# Patient Record
Sex: Female | Born: 1955 | Race: Black or African American | Hispanic: No | Marital: Married | Smoking: Never smoker
Health system: Southern US, Community
[De-identification: ages and names within clinical notes are randomized; demographics above are authoritative.]

## PROBLEM LIST (undated history)

## (undated) DIAGNOSIS — I10 Essential (primary) hypertension: Secondary | ICD-10-CM

## (undated) DIAGNOSIS — E119 Type 2 diabetes mellitus without complications: Secondary | ICD-10-CM

## (undated) DIAGNOSIS — I639 Cerebral infarction, unspecified: Secondary | ICD-10-CM

## (undated) HISTORY — DX: Cerebral infarction, unspecified: I63.9

## (undated) NOTE — *Deleted (*Deleted)
History and Physical    Sonya George WUJ:[id number] DOB: [birth year] DOA: 06/07/2020  PCP: Janeece Agee, NP   Patient coming from: Home.   I have personally briefly reviewed patient's old medical records in [facility]  Chief Complaint: ***  HPI: Sonya George is a 22 y.o. female with medical history significant of   ED Course: ***  Review of Systems: As per HPI otherwise all other systems reviewed and are negative.  Past Medical History:  Diagnosis Date  . Diabetes mellitus without complication (HCC)   . Hypertension   . Stroke [facility])     Past Surgical History:  Procedure Laterality Date  . BUBBLE STUDY  04/01/2020   Procedure: BUBBLE STUDY;  Surgeon: Wendall Stade, MD;  Location: [facility] ENDOSCOPY;  Service: Cardiovascular;;  . TEE WITHOUT CARDIOVERSION N/A 04/01/2020   Procedure: TRANSESOPHAGEAL ECHOCARDIOGRAM (TEE);  Surgeon: Wendall Stade, MD;  Location: [facility] ENDOSCOPY;  Service: Cardiovascular;  Laterality: N/A;   Social History  reports that she has never smoked. She has never used smokeless tobacco. She reports that she does not drink alcohol and does not use drugs.  No Known Allergies  Family History  Problem Relation Age of Onset  . Stroke Sister        Actively dying of CVA  . Stroke Brother        Died 2 weeks ago of CVA   Prior to Admission medications   Medication Sig Start Date End Date Taking? Authorizing Provider  acetaminophen (TYLENOL) 500 MG tablet Take 1,000 mg by mouth every 6 (six) hours as needed for headache (pain).   Yes [provider]  amLODipine (NORVASC) 10 MG tablet Take 1 tablet (10 mg total) by mouth daily. 12/14/19  Yes Janeece Agee, NP  aspirin EC 81 MG EC tablet Take 1 tablet (81 mg total) by mouth daily. Swallow whole. 04/02/20  Yes Vann, Jessica U, DO  atorvastatin (LIPITOR) 40 MG tablet Take 1 tablet (40 mg total) by mouth daily. 04/02/20  Yes Joseph Art, DO  clopidogrel (PLAVIX) 75 MG tablet Take 1 tablet  (75 mg total) by mouth daily. 04/02/20  Yes Vann, Jessica U, DO  lisinopril (ZESTRIL) 10 MG tablet Take 1 tablet (10 mg total) by mouth daily. 12/14/19  Yes Janeece Agee, NP  Menthol, Topical Analgesic, (BIOFREEZE EX) Apply 1 application topically daily as needed (leg pain).   Yes [provider]  metFORMIN (GLUCOPHAGE) 500 MG tablet Take 2 tablets (1,000 mg total) by mouth 2 (two) times daily with a meal. 12/14/19 06/11/20 Yes Janeece Agee, NP  Multiple Vitamins-Minerals (MULTIVITAMIN WITH MINERALS) tablet Take 1 tablet by mouth daily.   Yes [provider]   Physical Exam: Vitals:   06/07/20 1100 06/07/20 1115 06/07/20 1503 06/07/20 2052  BP: 120/68 136/67 (!) 141/104 111/62  Pulse: 79 84 91 79  Resp: 19 19 18 14   Temp:      TempSrc:      SpO2: 100% 98% 99% 100%  Weight:      Height:       Constitutional: NAD, calm, comfortable Eyes: PERRL, lids and conjunctivae normal ENMT: Mucous membranes are moist. Posterior pharynx clear of any exudate or lesions. Neck: normal, supple, no masses, no thyromegaly Respiratory: clear to auscultation bilaterally, no wheezing, no crackles. Normal respiratory effort. No accessory muscle use.  Cardiovascular: Regular rate and rhythm, no murmurs / rubs / gallops. No extremity edema. 2+ pedal pulses. No carotid bruits.  Abdomen: no tenderness, no masses  palpated. No hepatosplenomegaly. Bowel sounds positive.  Musculoskeletal: no clubbing / cyanosis. No joint deformity upper and lower extremities. Good ROM, no contractures. Normal muscle tone.  Skin: no rashes, lesions, ulcers. No induration Neurologic: CN 2-12 grossly intact. Sensation intact, DTR normal. Strength 5/5 in all 4.  Psychiatric: Normal judgment and insight. Alert and oriented x 3. Normal mood.   Labs on Admission: I have personally reviewed following labs and imaging studies  CBC: Recent Labs  Lab 06/07/20 0901  WBC 7.7  NEUTROABS 4.9  HGB 12.6  HCT 38.2  MCV 91.0   PLT 250    Basic Metabolic Panel: Recent Labs  Lab 06/07/20 0901  NA 139  K 3.8  CL 102  CO2 27  GLUCOSE 166*  BUN 9  CREATININE 0.71  CALCIUM 9.9   GFR: Estimated Creatinine Clearance: 65.6 mL/min (by C-G formula based on SCr of 0.71 mg/dL).  Liver Function Tests: Recent Labs  Lab 06/07/20 0901  AST 40  ALT 20  ALKPHOS 73  BILITOT 0.9  PROT 7.2  ALBUMIN 3.7   Urine analysis:    Component Value Date/Time   COLORURINE STRAW (A) 03/30/2020 1533   APPEARANCEUR CLEAR 03/30/2020 1533   LABSPEC 1.005 03/30/2020 1533   PHURINE 7.0 03/30/2020 1533   GLUCOSEU 50 (A) 03/30/2020 1533   HGBUR NEGATIVE 03/30/2020 1533   BILIRUBINUR NEGATIVE 03/30/2020 1533   KETONESUR NEGATIVE 03/30/2020 1533   PROTEINUR NEGATIVE 03/30/2020 1533   NITRITE NEGATIVE 03/30/2020 1533   LEUKOCYTESUR NEGATIVE 03/30/2020 1533   Radiological Exams on Admission: CT HEAD WO CONTRAST  Result Date: 06/07/2020 CLINICAL DATA:  67 year old female with history of mental status change. EXAM: CT HEAD WITHOUT CONTRAST TECHNIQUE: Contiguous axial images were obtained from the base of the skull through the vertex without intravenous contrast. COMPARISON:  Head CT 03/29/2020. FINDINGS: Brain: Mild cerebral atrophy. Patchy and confluent areas of decreased attenuation are noted throughout the deep and periventricular white matter of the cerebral hemispheres bilaterally, compatible with chronic microvascular ischemic disease. In addition, there are more well-defined areas of low attenuation scattered in a patchy distribution throughout the left cerebral hemisphere, right occipital region, and left cerebellar hemisphere compatible with areas of encephalomalacia/gliosis from prior infarctions. No definitive evidence of new acute or subacute ischemia. No definite mass or mass effect. No hydrocephalus. Vascular: No hyperdense vessel or unexpected calcification. Skull: Normal. Negative for fracture or focal lesion.  Sinuses/Orbits: No acute finding. Other: None. IMPRESSION: 1. No acute intracranial abnormalities. 2. Mild cerebral atrophy with chronic microvascular ischemic changes and patchy areas of encephalomalacia/gliosis in the cerebrum and cerebellum, similar to prior examinations, as above. Electronically Signed   By: Trudie Reed M.D.   On: 06/07/2020 10:03   DG Chest Port 1 View  Result Date: 06/07/2020 CLINICAL DATA:  Altered mental status, chest pain EXAM: PORTABLE CHEST 1 VIEW COMPARISON:  03/29/2020 chest radiograph. FINDINGS: Stable cardiomediastinal silhouette with mild cardiomegaly. No pneumothorax. No pleural effusion. Lungs appear clear, with no acute consolidative airspace disease and no pulmonary edema. IMPRESSION: Stable mild cardiomegaly without pulmonary edema. No active pulmonary disease. Electronically Signed   By: Delbert Phenix M.D.   On: 06/07/2020 09:49   04/01/2020 Echo TEE IMPRESSIONS   1. Left ventricular ejection fraction, by estimation, is 60 to 65%. The  left ventricle has normal function. The left ventricle has no regional  wall motion abnormalities.  2. Right ventricular systolic function is normal. The right ventricular  size is normal.  3. Left atrial size  was moderately dilated. No left atrial/left atrial  appendage thrombus was detected.  4. The mitral valve is normal in structure. Mild mitral valve  regurgitation. No evidence of mitral stenosis.  5. Tricuspid valve regurgitation is mild to moderate.  6. The aortic valve is tricuspid. Aortic valve regurgitation is moderate.  Mild to moderate aortic valve sclerosis/calcification is present, without  any evidence of aortic stenosis.  7. The inferior vena cava is normal in size with greater than 50%  respiratory variability, suggesting right atrial pressure of 3 mmHg.  8. Agitated saline contrast bubble study was negative, with no evidence  of any interatrial shunt.   Echo 03/30/2020   IMPRESSIONS  1.  Left ventricular ejection fraction, by estimation, is 60 to 65%. The  left ventricle has normal function. The left ventricle has no regional  wall motion abnormalities. Left ventricular diastolic parameters are  consistent with Grade I diastolic  dysfunction (impaired relaxation).  2. Right ventricular systolic function is normal. The right ventricular  size is normal. There is normal pulmonary artery systolic pressure.  3. Left atrial size was moderately dilated.  4. The mitral valve is normal in structure. No evidence of mitral valve  regurgitation. No evidence of mitral stenosis.  5. The aortic valve is normal in structure. Aortic valve regurgitation is  mild to moderate. No aortic stenosis is present.  6. The inferior vena cava is normal in size with greater than 50%  respiratory variability, suggesting right atrial pressure of 3 mmHg.   03/30/2020 carotid doppler  Summary:  Right Carotid: Velocities in the right ICA are consistent with a 1-39%  stenosis.   Left Carotid: Velocities in the left ICA are consistent with a 1-39%  stenosis.   Vertebrals: Bilateral vertebral arteries demonstrate antegrade flow.  Subclavians: Normal flow hemodynamics were seen in bilateral subclavian arteries.  EKG: Independently reviewed.  Vent. rate 84 BPM PR interval * ms QRS duration 94 ms QT/QTc 362/428 ms P-R-T axes 62 -1 30 Sinus rhythm Consider left ventricular hypertrophy  Assessment/Plan Principal Problem:   Acute encephalopathy Active Problems:   Type 2 diabetes mellitus (HCC)   Essential hypertension    DVT prophylaxis: *** (Lovenox/Heparin/SCD's/anticoagulated/None (if comfort care) Code Status:   *** (Full/Partial (specify details) Family Communication:  *** (Specify name, relationship. Do not write "discussed with patient". Specify tel # if discussed over the phone) Disposition Plan:   Patient is from:  ***  Anticipated DC to:  ***  Anticipated DC date:  ***   Anticipated DC barriers: ***  Consults called:  *** (with names) Admission status:  *** (inpatient / obs / tele / medical floor / SDU)  Severity of Illness:  Bobette Mo MD [facility]  How to contact the [facility] Attending or Consulting provider 7A - 7P or covering provider during after hours 7P -7A, for this patient?   1. Check the care team in [facility] and look for a) attending/consulting [facility] provider listed and b) the [facility] team listed 2. Log into [email or web address] and use [place]'s universal password to access. If you do not have the password, please contact the hospital operator. 3. Locate the [facility] provider you are looking for under [facility] and page to a number that you can be directly reached. 4. If you still have difficulty reaching the provider, please page the [facility] (Director on Call) for the Hospitalists listed on amion for assistance.  06/07/2020, 9:38 PM

---

## 2019-01-02 ENCOUNTER — Ambulatory Visit (HOSPITAL_COMMUNITY)
Admission: EM | Admit: 2019-01-02 | Discharge: 2019-01-02 | Disposition: A | Discharge status: Home / Self Care | Payer: Self-pay | Attending: Family Medicine | Admitting: Family Medicine

## 2019-01-02 ENCOUNTER — Encounter (HOSPITAL_COMMUNITY): Payer: Self-pay

## 2019-01-02 ENCOUNTER — Other Ambulatory Visit: Payer: Self-pay

## 2019-01-02 DIAGNOSIS — I10 Essential (primary) hypertension: Secondary | ICD-10-CM | POA: Insufficient documentation

## 2019-01-02 DIAGNOSIS — E1110 Type 2 diabetes mellitus with ketoacidosis without coma: Secondary | ICD-10-CM | POA: Insufficient documentation

## 2019-01-02 DIAGNOSIS — Z760 Encounter for issue of repeat prescription: Secondary | ICD-10-CM | POA: Insufficient documentation

## 2019-01-02 HISTORY — DX: Type 2 diabetes mellitus without complications: E11.9

## 2019-01-02 HISTORY — DX: Essential (primary) hypertension: I10

## 2019-01-02 LAB — COMPREHENSIVE METABOLIC PANEL
ALT: 14 U/L (ref 0–44)
AST: 21 U/L (ref 15–41)
Albumin: 3.9 g/dL (ref 3.5–5.0)
Alkaline Phosphatase: 72 U/L (ref 38–126)
Anion gap: 8 (ref 5–15)
BUN: 12 mg/dL (ref 8–23)
CO2: 27 mmol/L (ref 22–32)
Calcium: 9.7 mg/dL (ref 8.9–10.3)
Chloride: 104 mmol/L (ref 98–111)
Creatinine, Ser: 0.6 mg/dL (ref 0.44–1.00)
GFR calc Af Amer: >60 mL/min (ref >60)
GFR calc non Af Amer: >60 mL/min (ref >60)
Glucose, Bld: 107 mg/dL — ABNORMAL HIGH (ref 70–99)
Potassium: 4.1 mmol/L (ref 3.5–5.1)
Sodium: 139 mmol/L (ref 135–145)
Total Bilirubin: 0.6 mg/dL (ref 0.3–1.2)
Total Protein: 7.9 g/dL (ref 6.5–8.1)

## 2019-01-02 LAB — LIPID PANEL
Cholesterol: 154 mg/dL (ref 0–200)
HDL: 44 mg/dL (ref >40)
LDL Cholesterol: 94 mg/dL (ref 0–99)
Total CHOL/HDL Ratio: 3.5 ratio
Triglycerides: 81 mg/dL (ref <150)
VLDL: 16 mg/dL (ref 0–40)

## 2019-01-02 LAB — HEMOGLOBIN A1C
Hgb A1c MFr Bld: 5.6 % (ref 4.8–5.6)
Mean Plasma Glucose: 114.02 mg/dL

## 2019-01-02 MED ORDER — AMLODIPINE BESYLATE 10 MG PO TABS: 10.0000 mg | ORAL_TABLET | Freq: Every day | ORAL | 0 refills | Status: DC

## 2019-01-02 MED ORDER — METFORMIN HCL 500 MG PO TABS: 1000.0000 mg | ORAL_TABLET | Freq: Two times a day (BID) | ORAL | 0 refills | Status: DC

## 2019-01-02 MED ORDER — LISINOPRIL 10 MG PO TABS: 10.0000 mg | ORAL_TABLET | Freq: Every day | ORAL | 0 refills | Status: DC

## 2019-01-02 MED ORDER — ASPIRIN EC 81 MG PO TBEC: 81.0000 mg | DELAYED_RELEASE_TABLET | Freq: Every day | ORAL | 0 refills | Status: AC

## 2019-01-02 NOTE — ED Notes (Signed)
Patient verbalizes understanding of discharge instructions. Opportunity for questioning and answers were provided. Patient discharged from UCC by provider.  

## 2019-01-02 NOTE — ED Triage Notes (Signed)
Patient presents to Urgent Care with complaints of needing medication refill since becoming low on some of her daily meds, including metformin, lisinopril, amlodipine, and aspirin. Patient reports she is here from [place], is not able to return home due to the pandemic at this time. Pt is not OUT of any medications, is just worried of becoming low. Pt brought medications with her.

## 2019-01-02 NOTE — Discharge Instructions (Addendum)
Your routine diabetic labs were done today. This is so that your new PCP can review them and help you make any medication adjustments you may need. It is important to keep track of your sugars if you are able to.  Please write down your sugars when you check and bring them to your PCP

## 2019-01-02 NOTE — ED Provider Notes (Addendum)
[place]    CSN: [id number] Arrival date & time: 01/02/19  1115     History   Chief Complaint Chief Complaint  Patient presents with  . Medication Refill    HPI Sonya George is a 63 y.o. female presenting for medication refill with her son-in-law.  Patient lives in [place], was visiting family though has had unexpected prolonged stay due to coronavirus.  Patient reports history of diabetes and hypertension, brought medications with her today's appointment.  Patient's son-in-law states that they are also interested in establishing care in the area as on his borders recently closed and they are unsure when she will be able to go back.  Patient does not routinely check her sugars: Denies hypoglycemic events, polyuria, polydipsia, polyphagia.  Patient states she is compliant with her medications and rarely misses doses.   Past Medical History:  Diagnosis Date  . Diabetes mellitus without complication ([place])   . Hypertension     There are no active problems to display for this patient.   History reviewed. No pertinent surgical history.  OB History   No obstetric history on file.      Home Medications    Prior to Admission medications   Medication Sig Start Date End Date Taking? Authorizing Provider  amLODipine (NORVASC) 10 MG tablet Take 1 tablet (10 mg total) by mouth daily for 30 days. 01/02/19 02/01/19  Hall-Potvin, [place], PA-C  aspirin EC 81 MG tablet Take 1 tablet (81 mg total) by mouth daily for 30 days. 01/02/19 02/01/19  Hall-Potvin, [place], PA-C  lisinopril (ZESTRIL) 10 MG tablet Take 1 tablet (10 mg total) by mouth daily for 30 days. 01/02/19 02/01/19  Hall-Potvin, [place], PA-C  metFORMIN (GLUCOPHAGE) 500 MG tablet Take 2 tablets (1,000 mg total) by mouth 2 (two) times daily with a meal for 30 days. 01/02/19 02/01/19  Hall-Potvin, [place], PA-C    Family History Family History  Problem Relation Age of Onset  . Healthy Mother   . Healthy Father      Social History Social History   Tobacco Use  . Smoking status: Never Smoker  . Smokeless tobacco: Never Used  Substance Use Topics  . Alcohol use: Not Currently  . Drug use: Never     Allergies   Patient has no known allergies.   Review of Systems As per HPI   Physical Exam Triage Vital Signs ED Triage Vitals  Enc Vitals Group     BP 01/02/19 1140 132/86     Pulse Rate 01/02/19 1140 84     Resp 01/02/19 1140 18     Temp 01/02/19 1140 98.4 F (36.9 C)     Temp Source 01/02/19 1140 Oral     SpO2 01/02/19 1140 100 %     Weight --      Height --      Head Circumference --      Peak Flow --      Pain Score 01/02/19 1137 0     Pain Loc --      Pain Edu? --      Excl. in [place]? --    No data found.  Updated Vital Signs BP 132/86 (BP Location: Left Arm)   Pulse 84   Temp 98.4 F (36.9 C) (Oral)   Resp 18   SpO2 100%   Visual Acuity Right Eye Distance:   Left Eye Distance:   Bilateral Distance:    Right Eye Near:   Left Eye Near:  Bilateral Near:     Physical Exam Vitals signs reviewed.  Constitutional:      General: She is not in acute distress.    Appearance: She is obese.  HENT:     Head: Normocephalic and atraumatic.  Eyes:     General: No scleral icterus.    Pupils: Pupils are equal, round, and reactive to light.  Cardiovascular:     Rate and Rhythm: Normal rate.  Pulmonary:     Effort: Pulmonary effort is normal.  Skin:    General: Skin is warm.     Coloration: Skin is not jaundiced or pale.  Neurological:     Mental Status: She is alert and oriented to person, place, and time.      UC Treatments / Results  Labs (all labs ordered are listed, but only abnormal results are displayed) Labs Reviewed  COMPREHENSIVE METABOLIC PANEL - Abnormal; Notable for the following components:      Result Value   Glucose, Bld 107 (*)    All other components within normal limits  HEMOGLOBIN A1C  LIPID PANEL    EKG None  Radiology No results  found.  Procedures Procedures (including critical care time)  Medications Ordered in UC Medications - No data to display  Initial Impression / Assessment and Plan / UC Course  I have reviewed the triage vital signs and the nursing notes.  Pertinent labs & imaging results that were available during my care of the patient were reviewed by me and considered in my medical decision making (see chart for details).     Pleasant 63 year old female from [place] with history of diabetes and hypertension reportedly well controlled presenting for medication refill.  Medication sent, routine lab work done (reviewed after patient discharge: A1c 5.6, lipid and comp within normal ranges) for primary care to review intends on establishing care this week.   Final Clinical Impressions(s) / UC Diagnoses   Final diagnoses:  Encounter for medication refill  DM (diabetes mellitus) type 2, uncontrolled, with ketoacidosis ([place])  Essential hypertension     Discharge Instructions     Your routine diabetic labs were done today. This is so that your new PCP can review them and help you make any medication adjustments you may need. It is important to keep track of your sugars if you are able to.  Please write down your sugars when you check and bring them to your PCP    ED Prescriptions    Medication Sig Dispense Auth. Provider   amLODipine (NORVASC) 10 MG tablet Take 1 tablet (10 mg total) by mouth daily for 30 days. 30 tablet Hall-Potvin, [place], PA-C   aspirin EC 81 MG tablet Take 1 tablet (81 mg total) by mouth daily for 30 days. 30 tablet Hall-Potvin, [place], PA-C   lisinopril (ZESTRIL) 10 MG tablet Take 1 tablet (10 mg total) by mouth daily for 30 days. 30 tablet Hall-Potvin, [place], PA-C   metFORMIN (GLUCOPHAGE) 500 MG tablet Take 2 tablets (1,000 mg total) by mouth 2 (two) times daily with a meal for 30 days. 120 tablet Hall-Potvin, [place], PA-C     Controlled Substance Prescriptions [place]  Controlled Substance Registry consulted? Not Applicable   Quincy Sheehan, PA-C 01/02/19 1810    Hall-Potvin, [place], [place] 01/02/19 1815

## 2019-01-19 ENCOUNTER — Ambulatory Visit: Payer: Self-pay | Admitting: Registered Nurse

## 2019-01-29 ENCOUNTER — Encounter: Payer: Self-pay | Admitting: Registered Nurse

## 2019-01-29 ENCOUNTER — Other Ambulatory Visit: Payer: Self-pay

## 2019-01-29 ENCOUNTER — Ambulatory Visit: Payer: Self-pay | Admitting: Registered Nurse

## 2019-01-29 VITALS — BP 150/74 | HR 91 | Temp 98.4°F | Resp 16 | Wt 159.0 lb

## 2019-01-29 DIAGNOSIS — E119 Type 2 diabetes mellitus without complications: Secondary | ICD-10-CM | POA: Insufficient documentation

## 2019-01-29 DIAGNOSIS — Z7689 Persons encountering health services in other specified circumstances: Secondary | ICD-10-CM

## 2019-01-29 DIAGNOSIS — I10 Essential (primary) hypertension: Secondary | ICD-10-CM | POA: Insufficient documentation

## 2019-01-29 MED ORDER — LISINOPRIL 10 MG PO TABS: 10.0000 mg | ORAL_TABLET | Freq: Every day | ORAL | 0 refills | Status: DC

## 2019-01-29 MED ORDER — METFORMIN HCL 500 MG PO TABS: 1000.0000 mg | ORAL_TABLET | Freq: Two times a day (BID) | ORAL | 2 refills | Status: DC

## 2019-01-29 MED ORDER — AMLODIPINE BESYLATE 10 MG PO TABS: 10.0000 mg | ORAL_TABLET | Freq: Every day | ORAL | 0 refills | Status: DC

## 2019-01-29 NOTE — Patient Instructions (Signed)
° ° ° °  If you have lab work done today you will be contacted with your lab results within the next 2 weeks.  If you have not heard from us then please contact us. The fastest way to get your results is to register for My Chart. ° ° °IF you received an x-ray today, you will receive an invoice from [facility]. Please contact [facility] at [phone] with questions or concerns regarding your invoice.  ° °IF you received labwork today, you will receive an invoice from [facility]. Please contact [facility] at [phone] with questions or concerns regarding your invoice.  ° °Our billing staff will not be able to assist you with questions regarding bills from these companies. ° °You will be contacted with the lab results as soon as they are available. The fastest way to get your results is to activate your My Chart account. Instructions are located on the last page of this paperwork. If you have not heard from us regarding the results in 2 weeks, please contact this office. °  ° ° ° °

## 2019-01-29 NOTE — Progress Notes (Signed)
Established Patient Office Visit  Subjective:  Patient ID: Sonya George, female    DOB: 1956/01/17  Age: 63 y.o. MRN: [id number]  CC:  Chief Complaint  Patient presents with  . Establish Care    HPI Sonya George presents for visit to establish care.  She is from [place] - she had been traveling to visit her children in [place] when COVID-19 caused the imposition of travel restrictions. As such, she has been here substantially longer than she originally intended.  She has a medical history significant for T2DM, CVA, and HTN. These are managed with medications:  T2DM managed with metformin 2000mg  PO qd. Her most recent A1c is 5.6% - this is very well within control. She may continue this dose, we will recheck A1c in 3 mos and consider lowering her dose. She does not check sugars at home.   HTN: Lisinopril 10mg  PO qd and amlodipine 10mg  PO qd. Her BP is mildly elevated to 150/74 today. This warrants a recheck and may warrant further medication management. She states this is higher than her normal range.   CVA: Daily ASA  Pt reports it has been too long since she updated her glasses prescription and is concerned for her vision - we will refer to ophthalmology for diabetic eye exam, as she has had none on file with [place].  She reports that she feels like she "can't walk". Denies pain in chest and lower extremities, denies dizziness, denies claudication, denies swelling of extremities, denies headache, denies GI upset, denies unilateral weakness. Endorses fatigue. States her activity level has decreased since she came to visit her children - she is trying to stay socially isolated as to avoid COVID-19.  Past Medical History:  Diagnosis Date  . Diabetes mellitus without complication ([place])   . Hypertension   . Stroke [facility])     History reviewed. No pertinent surgical history.  Family History  Problem Relation Age of Onset  . Healthy Mother   . Healthy Father     Social History    Socioeconomic History  . Marital status: Married    Spouse name: Not on file  . Number of children: Not on file  . Years of education: Not on file  . Highest education level: Not on file  Occupational History  . Not on file  Social Needs  . Financial resource strain: Not hard at all  . Food insecurity    Worry: Never true    Inability: Never true  . Transportation needs    Medical: No    Non-medical: No  Tobacco Use  . Smoking status: Never Smoker  . Smokeless tobacco: Never Used  Substance and Sexual Activity  . Alcohol use: Not Currently  . Drug use: Never  . Sexual activity: Not on file  Lifestyle  . Physical activity    Days per week: 3 days    Minutes per session: 30 min  . Stress: Only a little  Relationships  . Social Herbalist on phone: Three times a week    Gets together: Twice a week    Attends religious service: Patient refused    Active member of club or organization: Patient refused    Attends meetings of clubs or organizations: Patient refused    Relationship status: Married  . Intimate partner violence    Fear of current or ex partner: No    Emotionally abused: No    Physically abused: No    Forced sexual activity: No  Other Topics Concern  . Not on file  Social History Narrative  . Not on file    Outpatient Medications Prior to Visit  Medication Sig Dispense Refill  . amLODipine (NORVASC) 10 MG tablet Take 1 tablet (10 mg total) by mouth daily for 30 days. 30 tablet 0  . aspirin EC 81 MG tablet Take 1 tablet (81 mg total) by mouth daily for 30 days. 30 tablet 0  . lisinopril (ZESTRIL) 10 MG tablet Take 1 tablet (10 mg total) by mouth daily for 30 days. 30 tablet 0  . metFORMIN (GLUCOPHAGE) 500 MG tablet Take 2 tablets (1,000 mg total) by mouth 2 (two) times daily with a meal for 30 days. 120 tablet 0   No facility-administered medications prior to visit.     No Known Allergies  ROS Review of Systems  Constitutional: Negative.    HENT: Negative.   Eyes: Negative.   Respiratory: Negative.   Cardiovascular: Negative.   Gastrointestinal: Negative.   Endocrine: Negative.   Genitourinary: Negative.   Musculoskeletal: Negative.   Skin: Negative.   Allergic/Immunologic: Negative.   Neurological: Negative.   Hematological: Negative.   Psychiatric/Behavioral: Negative.       Objective:    Physical Exam  Constitutional: She is oriented to person, place, and time. She appears well-developed and well-nourished.  HENT:  Head: Normocephalic and atraumatic.  Cardiovascular: Normal rate, regular rhythm and intact distal pulses.  Pulmonary/Chest: Effort normal. No respiratory distress.  Musculoskeletal: Normal range of motion.        General: No tenderness, deformity or edema.  Neurological: She is alert and oriented to person, place, and time.  Skin: Skin is warm and dry. No rash noted. No erythema. No pallor.  Psychiatric: She has a normal mood and affect. Her behavior is normal. Judgment and thought content normal.   Strength 5/5 and even bilaterally.  Rapid capillary refill in bilat lower extremities   BP (!) 150/74   Pulse 91   Temp 98.4 F (36.9 C) (Other (Comment))   Resp 16   Wt 159 lb (72.1 kg)   SpO2 97%  Wt Readings from Last 3 Encounters:  01/29/19 159 lb (72.1 kg)     Health Maintenance Due  Topic Date Due  . Hepatitis C Screening  March 17, 1956  . HIV Screening  05/11/1970  . TETANUS/TDAP  05/11/1974  . PAP SMEAR-Modifier  05/11/1976  . MAMMOGRAM  05/11/2005  . COLONOSCOPY  05/11/2005    There are no preventive care reminders to display for this patient.  No results found for: TSH No results found for: WBC, HGB, HCT, MCV, PLT Lab Results  Component Value Date   NA 139 01/02/2019   K 4.1 01/02/2019   CO2 27 01/02/2019   GLUCOSE 107 (H) 01/02/2019   BUN 12 01/02/2019   CREATININE 0.60 01/02/2019   BILITOT 0.6 01/02/2019   ALKPHOS 72 01/02/2019   AST 21 01/02/2019   ALT 14  01/02/2019   PROT 7.9 01/02/2019   ALBUMIN 3.9 01/02/2019   CALCIUM 9.7 01/02/2019   ANIONGAP 8 01/02/2019   Lab Results  Component Value Date   CHOL 154 01/02/2019   Lab Results  Component Value Date   HDL 44 01/02/2019   Lab Results  Component Value Date   LDLCALC 94 01/02/2019   Lab Results  Component Value Date   TRIG 81 01/02/2019   Lab Results  Component Value Date   CHOLHDL 3.5 01/02/2019   Lab Results  Component Value Date  HGBA1C 5.6 01/02/2019      Assessment & Plan:   Problem List Items Addressed This Visit    None    Visit Diagnoses    Encounter to establish care    -  Primary   Type 2 diabetes mellitus without complication, without long-term current use of insulin ([place])          No orders of the defined types were placed in this encounter.   Follow-up: No follow-ups on file.   PLAN:  Suspect weakness/fatigue is related to a combination of low blood sugar and deconditioning. This is a patient who was reportedly very active before these preceding months of inactivity d/t COVID-19. We discussed doing home exercises as well as monitoring her sugars at home. Given her a1c of 5.6%, we discussed that it is fine to go up a few points - I usually strive keep patients of her age and history in a range of 7-8%. Our main goal is to avoid falls.   Continue medications as prescribed. Will send in 3 mos of refills - we are hoping she is able to travel home in that time, otherwise, we will see her for a med check and labs.  Patient encouraged to call clinic with any questions, comments, or concerns.    Maximiano Coss, NP

## 2019-04-11 ENCOUNTER — Encounter: Payer: Self-pay | Admitting: Registered Nurse

## 2019-04-18 ENCOUNTER — Other Ambulatory Visit: Payer: Self-pay | Admitting: Registered Nurse

## 2019-04-18 DIAGNOSIS — E119 Type 2 diabetes mellitus without complications: Secondary | ICD-10-CM

## 2019-04-18 DIAGNOSIS — I10 Essential (primary) hypertension: Secondary | ICD-10-CM

## 2019-04-18 MED ORDER — AMLODIPINE BESYLATE 10 MG PO TABS: 10.0000 mg | ORAL_TABLET | Freq: Every day | ORAL | 0 refills | Status: DC

## 2019-04-18 MED ORDER — LISINOPRIL 10 MG PO TABS: 10.0000 mg | ORAL_TABLET | Freq: Every day | ORAL | 0 refills | Status: DC

## 2019-04-18 MED ORDER — METFORMIN HCL 500 MG PO TABS: 1000.0000 mg | ORAL_TABLET | Freq: Two times a day (BID) | ORAL | 0 refills | Status: DC

## 2019-04-18 NOTE — Telephone Encounter (Signed)
Medication Refill - Medication:      amLODipine (NORVASC) 10 MG tablet    lisinopril (ZESTRIL) 10 MG tablet    metFORMIN (GLUCOPHAGE) 500 MG tablet        Preferred Pharmacy (with phone number or street name):  [facility] [address], [place] [phone] (Phone) [phone] (Fax)

## 2019-04-30 ENCOUNTER — Ambulatory Visit: Payer: Self-pay | Admitting: Registered Nurse

## 2019-05-01 ENCOUNTER — Encounter: Payer: Self-pay | Admitting: Registered Nurse

## 2019-09-03 ENCOUNTER — Other Ambulatory Visit: Payer: Self-pay | Admitting: Registered Nurse

## 2019-09-03 DIAGNOSIS — I10 Essential (primary) hypertension: Secondary | ICD-10-CM

## 2019-09-03 NOTE — Telephone Encounter (Signed)
Need an appt for any further refills

## 2019-09-03 NOTE — Telephone Encounter (Signed)
Please schedule appt to be seen for any further refills

## 2019-09-04 ENCOUNTER — Telehealth: Payer: Self-pay | Admitting: Registered Nurse

## 2019-09-04 NOTE — Telephone Encounter (Signed)
Pt mb full, could not leave msg

## 2019-10-03 ENCOUNTER — Other Ambulatory Visit: Payer: Self-pay | Admitting: Registered Nurse

## 2019-10-03 DIAGNOSIS — I10 Essential (primary) hypertension: Secondary | ICD-10-CM

## 2019-12-04 ENCOUNTER — Other Ambulatory Visit: Payer: Self-pay | Admitting: Registered Nurse

## 2019-12-04 DIAGNOSIS — I10 Essential (primary) hypertension: Secondary | ICD-10-CM

## 2019-12-07 ENCOUNTER — Other Ambulatory Visit: Payer: Self-pay | Admitting: Registered Nurse

## 2019-12-07 DIAGNOSIS — I10 Essential (primary) hypertension: Secondary | ICD-10-CM

## 2019-12-07 NOTE — Telephone Encounter (Signed)
Pt is needing an appt for nx refills, last refill was courtesy

## 2019-12-11 NOTE — Telephone Encounter (Signed)
LVM for pt to call back to make appt for this med refill.

## 2019-12-14 ENCOUNTER — Encounter: Payer: Self-pay | Admitting: Registered Nurse

## 2019-12-14 ENCOUNTER — Other Ambulatory Visit: Payer: Self-pay

## 2019-12-14 ENCOUNTER — Ambulatory Visit (INDEPENDENT_AMBULATORY_CARE_PROVIDER_SITE_OTHER): Payer: Self-pay | Admitting: Registered Nurse

## 2019-12-14 DIAGNOSIS — I10 Essential (primary) hypertension: Secondary | ICD-10-CM

## 2019-12-14 DIAGNOSIS — E119 Type 2 diabetes mellitus without complications: Secondary | ICD-10-CM

## 2019-12-14 LAB — POCT GLYCOSYLATED HEMOGLOBIN (HGB A1C): Hemoglobin A1C: 6.8 % — AB (ref 4.0–5.6)

## 2019-12-14 MED ORDER — LISINOPRIL 10 MG PO TABS: 10.0000 mg | ORAL_TABLET | Freq: Every day | ORAL | 1 refills | Status: AC

## 2019-12-14 MED ORDER — AMLODIPINE BESYLATE 10 MG PO TABS: 10.0000 mg | ORAL_TABLET | Freq: Every day | ORAL | 1 refills | Status: AC

## 2019-12-14 MED ORDER — METFORMIN HCL 500 MG PO TABS: 1000.0000 mg | ORAL_TABLET | Freq: Two times a day (BID) | ORAL | 1 refills | Status: AC

## 2019-12-14 NOTE — Patient Instructions (Signed)
° ° ° °  If you have lab work done today you will be contacted with your lab results within the next 2 weeks.  If you have not heard from us then please contact us. The fastest way to get your results is to register for My Chart. ° ° °IF you received an x-ray today, you will receive an invoice from [facility]. Please contact [facility] at [phone] with questions or concerns regarding your invoice.  ° °IF you received labwork today, you will receive an invoice from [facility]. Please contact [facility] at [phone] with questions or concerns regarding your invoice.  ° °Our billing staff will not be able to assist you with questions regarding bills from these companies. ° °You will be contacted with the lab results as soon as they are available. The fastest way to get your results is to activate your My Chart account. Instructions are located on the last page of this paperwork. If you have not heard from us regarding the results in 2 weeks, please contact this office. °  ° ° ° °

## 2019-12-14 NOTE — Progress Notes (Signed)
Established Patient Office Visit  Subjective:  Patient ID: Sonya George, female    DOB: 19-May-1956  Age: 64 y.o. MRN: [id number]  CC:  Chief Complaint  Patient presents with  . Medication Refill    needs a refill on all medications and just a check up    HPI Ailin Yantis presents for HTN and T2DM med refills  Feeling well. No chest pain, shob, doe, or other concerns regarding bp Does not check home bp or sugars No changes to diet or activity. Occ some mild swelling of ankles but no claudication, no current swelling.  Declines further testing and preventative care as she does not have insurance - lives in [place] but is staying with her daughter through the pandemic.  Past Medical History:  Diagnosis Date  . Diabetes mellitus without complication ([place])   . Hypertension   . Stroke [facility])     No past surgical history on file.  Family History  Problem Relation Age of Onset  . Healthy Mother   . Healthy Father     Social History   Socioeconomic History  . Marital status: Married    Spouse name: Not on file  . Number of children: Not on file  . Years of education: Not on file  . Highest education level: Not on file  Occupational History  . Not on file  Tobacco Use  . Smoking status: Never Smoker  . Smokeless tobacco: Never Used  Substance and Sexual Activity  . Alcohol use: Not Currently  . Drug use: Never  . Sexual activity: Not on file  Other Topics Concern  . Not on file  Social History Narrative  . Not on file   Social Determinants of Health   Financial Resource Strain: Low Risk   . Difficulty of Paying Living Expenses: Not hard at all  Food Insecurity: No Food Insecurity  . Worried About Charity fundraiser in the Last Year: Never true  . Ran Out of Food in the Last Year: Never true  Transportation Needs: No Transportation Needs  . Lack of Transportation (Medical): No  . Lack of Transportation (Non-Medical): No  Physical Activity:  Insufficiently Active  . Days of Exercise per Week: 3 days  . Minutes of Exercise per Session: 30 min  Stress: No Stress Concern Present  . Feeling of Stress : Only a little  Social Connections: Unknown  . Frequency of Communication with Friends and Family: Three times a week  . Frequency of Social Gatherings with Friends and Family: Twice a week  . Attends Religious Services: Patient refused  . Active Member of Clubs or Organizations: Patient refused  . Attends Archivist Meetings: Patient refused  . Marital Status: Married  Human resources officer Violence: Not At Risk  . Fear of Current or Ex-Partner: No  . Emotionally Abused: No  . Physically Abused: No  . Sexually Abused: No    Outpatient Medications Prior to Visit  Medication Sig Dispense Refill  . amLODipine (NORVASC) 10 MG tablet Take 1 tablet by mouth once daily 90 tablet 0  . lisinopril (ZESTRIL) 10 MG tablet Take 1 tablet by mouth once daily 30 tablet 0  . metFORMIN (GLUCOPHAGE) 500 MG tablet Take 2 tablets (1,000 mg total) by mouth 2 (two) times daily with a meal. 120 tablet 0   No facility-administered medications prior to visit.    No Known Allergies  ROS Review of Systems  Constitutional: Negative.   HENT: Negative.   Eyes: Negative.  Respiratory: Negative.   Cardiovascular: Positive for leg swelling. Negative for chest pain and palpitations.  Gastrointestinal: Negative.   Endocrine: Negative.   Genitourinary: Negative.   Musculoskeletal: Negative.   Skin: Negative.   Allergic/Immunologic: Negative.   Neurological: Negative.   Hematological: Negative.   Psychiatric/Behavioral: Negative.   All other systems reviewed and are negative.     Objective:    Physical Exam  Constitutional: She is oriented to person, place, and time. She appears well-developed and well-nourished. No distress.  Cardiovascular: Normal rate, regular rhythm, normal heart sounds and intact distal pulses. Exam reveals no  gallop and no friction rub.  No murmur heard. Pulmonary/Chest: Effort normal and breath sounds normal. No respiratory distress. She has no wheezes. She has no rales. She exhibits no tenderness.  Neurological: She is alert and oriented to person, place, and time.  Skin: Skin is warm and dry. No rash noted. She is not diaphoretic. No erythema. No pallor.  Psychiatric: She has a normal mood and affect. Her behavior is normal. Judgment and thought content normal.  Nursing note and vitals reviewed.   BP 121/71   Pulse 91   Temp 98.1 F (36.7 C) (Temporal)   Resp 17   Wt 170 lb 12.8 oz (77.5 kg)   SpO2 98%  Wt Readings from Last 3 Encounters:  12/14/19 170 lb 12.8 oz (77.5 kg)  01/29/19 159 lb (72.1 kg)     Health Maintenance Due  Topic Date Due  . Hepatitis C Screening  Never done  . OPHTHALMOLOGY EXAM  Never done  . HIV Screening  Never done  . COVID-19 Vaccine (1) Never done  . MAMMOGRAM  Never done  . COLONOSCOPY  Never done  . HEMOGLOBIN A1C  07/04/2019    There are no preventive care reminders to display for this patient.  No results found for: TSH No results found for: WBC, HGB, HCT, MCV, PLT Lab Results  Component Value Date   NA 139 01/02/2019   K 4.1 01/02/2019   CO2 27 01/02/2019   GLUCOSE 107 (H) 01/02/2019   BUN 12 01/02/2019   CREATININE 0.60 01/02/2019   BILITOT 0.6 01/02/2019   ALKPHOS 72 01/02/2019   AST 21 01/02/2019   ALT 14 01/02/2019   PROT 7.9 01/02/2019   ALBUMIN 3.9 01/02/2019   CALCIUM 9.7 01/02/2019   ANIONGAP 8 01/02/2019   Lab Results  Component Value Date   CHOL 154 01/02/2019   Lab Results  Component Value Date   HDL 44 01/02/2019   Lab Results  Component Value Date   LDLCALC 94 01/02/2019   Lab Results  Component Value Date   TRIG 81 01/02/2019   Lab Results  Component Value Date   CHOLHDL 3.5 01/02/2019   Lab Results  Component Value Date   HGBA1C 5.6 01/02/2019      Assessment & Plan:   Problem List Items  Addressed This Visit      Cardiovascular and Mediastinum   Essential hypertension   Relevant Medications   amLODipine (NORVASC) 10 MG tablet   lisinopril (ZESTRIL) 10 MG tablet     Endocrine   Diabetes (HCC)   Relevant Medications   lisinopril (ZESTRIL) 10 MG tablet   metFORMIN (GLUCOPHAGE) 500 MG tablet   Other Relevant Orders   POCT glycosylated hemoglobin (Hb A1C)      Meds ordered this encounter  Medications  . amLODipine (NORVASC) 10 MG tablet    Sig: Take 1 tablet (10 mg total) by mouth  daily.    Dispense:  90 tablet    Refill:  1    Order Specific Question:   Supervising Provider    Answer:   Delia Chimes A [id number]  . lisinopril (ZESTRIL) 10 MG tablet    Sig: Take 1 tablet (10 mg total) by mouth daily.    Dispense:  90 tablet    Refill:  1    Order Specific Question:   Supervising Provider    Answer:   Delia Chimes A [id number]  . metFORMIN (GLUCOPHAGE) 500 MG tablet    Sig: Take 2 tablets (1,000 mg total) by mouth 2 (two) times daily with a meal.    Dispense:  360 tablet    Refill:  1    Order Specific Question:   Supervising Provider    Answer:   Forrest Moron [id number]    Follow-up: No follow-ups on file.   PLAN  Refill medications for 6 mo  Check a1c today  Return in 6 mo for med refill   Occ swelling in ankles likely related to some dehydration - heart sounds appropriate, no evidence of DVT, no claudication limits concern for adverse cardiovascular events or CHF.  Patient encouraged to call clinic with any questions, comments, or concerns.  Maximiano Coss, NP

## 2020-03-29 ENCOUNTER — Encounter (HOSPITAL_COMMUNITY): Payer: Self-pay | Admitting: Internal Medicine

## 2020-03-29 ENCOUNTER — Inpatient Hospital Stay (HOSPITAL_COMMUNITY): Payer: Medicaid Other

## 2020-03-29 ENCOUNTER — Inpatient Hospital Stay (HOSPITAL_COMMUNITY)
Admission: EM | Admit: 2020-03-29 | Discharge: 2020-04-01 | DRG: 066 | Disposition: A | Discharge status: Home / Self Care | Payer: Medicaid Other | Attending: Internal Medicine | Admitting: Internal Medicine

## 2020-03-29 ENCOUNTER — Other Ambulatory Visit: Payer: Self-pay

## 2020-03-29 ENCOUNTER — Emergency Department (HOSPITAL_COMMUNITY): Payer: Medicaid Other

## 2020-03-29 DIAGNOSIS — R29703 NIHSS score 3: Secondary | ICD-10-CM | POA: Diagnosis present

## 2020-03-29 DIAGNOSIS — R4701 Aphasia: Secondary | ICD-10-CM | POA: Diagnosis present

## 2020-03-29 DIAGNOSIS — E119 Type 2 diabetes mellitus without complications: Secondary | ICD-10-CM | POA: Diagnosis present

## 2020-03-29 DIAGNOSIS — Z1389 Encounter for screening for other disorder: Secondary | ICD-10-CM

## 2020-03-29 DIAGNOSIS — I10 Essential (primary) hypertension: Secondary | ICD-10-CM | POA: Diagnosis present

## 2020-03-29 DIAGNOSIS — E785 Hyperlipidemia, unspecified: Secondary | ICD-10-CM | POA: Diagnosis present

## 2020-03-29 DIAGNOSIS — Z20822 Contact with and (suspected) exposure to covid-19: Secondary | ICD-10-CM | POA: Diagnosis present

## 2020-03-29 DIAGNOSIS — I63412 Cerebral infarction due to embolism of left middle cerebral artery: Secondary | ICD-10-CM | POA: Diagnosis present

## 2020-03-29 DIAGNOSIS — Z823 Family history of stroke: Secondary | ICD-10-CM | POA: Diagnosis not present

## 2020-03-29 DIAGNOSIS — N182 Chronic kidney disease, stage 2 (mild): Secondary | ICD-10-CM

## 2020-03-29 DIAGNOSIS — Z8673 Personal history of transient ischemic attack (TIA), and cerebral infarction without residual deficits: Secondary | ICD-10-CM

## 2020-03-29 DIAGNOSIS — Z7984 Long term (current) use of oral hypoglycemic drugs: Secondary | ICD-10-CM | POA: Diagnosis not present

## 2020-03-29 DIAGNOSIS — E1122 Type 2 diabetes mellitus with diabetic chronic kidney disease: Secondary | ICD-10-CM

## 2020-03-29 DIAGNOSIS — Z79899 Other long term (current) drug therapy: Secondary | ICD-10-CM | POA: Diagnosis not present

## 2020-03-29 DIAGNOSIS — I639 Cerebral infarction, unspecified: Secondary | ICD-10-CM | POA: Diagnosis present

## 2020-03-29 DIAGNOSIS — I6340 Cerebral infarction due to embolism of unspecified cerebral artery: Secondary | ICD-10-CM | POA: Diagnosis present

## 2020-03-29 DIAGNOSIS — I63013 Cerebral infarction due to thrombosis of bilateral vertebral arteries: Secondary | ICD-10-CM

## 2020-03-29 LAB — COMPREHENSIVE METABOLIC PANEL
ALT: 13 U/L (ref 0–44)
AST: 25 U/L (ref 15–41)
Albumin: 3.8 g/dL (ref 3.5–5.0)
Alkaline Phosphatase: 77 U/L (ref 38–126)
Anion gap: 9 (ref 5–15)
BUN: 9 mg/dL (ref 8–23)
CO2: 25 mmol/L (ref 22–32)
Calcium: 9.6 mg/dL (ref 8.9–10.3)
Chloride: 105 mmol/L (ref 98–111)
Creatinine, Ser: 0.67 mg/dL (ref 0.44–1.00)
GFR calc Af Amer: >60 mL/min (ref >60)
GFR calc non Af Amer: >60 mL/min (ref >60)
Glucose, Bld: 138 mg/dL — ABNORMAL HIGH (ref 70–99)
Potassium: 4.3 mmol/L (ref 3.5–5.1)
Sodium: 139 mmol/L (ref 135–145)
Total Bilirubin: 0.9 mg/dL (ref 0.3–1.2)
Total Protein: 7.6 g/dL (ref 6.5–8.1)

## 2020-03-29 LAB — CBC
HCT: 40 % (ref 36.0–46.0)
Hemoglobin: 13.1 g/dL (ref 12.0–15.0)
MCH: 29.8 pg (ref 26.0–34.0)
MCHC: 32.8 g/dL (ref 30.0–36.0)
MCV: 90.9 fL (ref 80.0–100.0)
Platelets: 278 10*3/uL (ref 150–400)
RBC: 4.4 MIL/uL (ref 3.87–5.11)
RDW: 13.2 % (ref 11.5–15.5)
WBC: 9.1 10*3/uL (ref 4.0–10.5)
nRBC: 0 % (ref 0.0–0.2)

## 2020-03-29 LAB — RAPID URINE DRUG SCREEN, HOSP PERFORMED
Amphetamines: NOT DETECTED
Barbiturates: NOT DETECTED
Benzodiazepines: NOT DETECTED
Cocaine: NOT DETECTED
Opiates: NOT DETECTED
Tetrahydrocannabinol: NOT DETECTED

## 2020-03-29 LAB — DIFFERENTIAL
Abs Immature Granulocytes: 0.02 10*3/uL (ref 0.00–0.07)
Basophils Absolute: 0.1 10*3/uL (ref 0.0–0.1)
Basophils Relative: 1 %
Eosinophils Absolute: 0 10*3/uL (ref 0.0–0.5)
Eosinophils Relative: 0 %
Immature Granulocytes: 0 %
Lymphocytes Relative: 25 %
Lymphs Abs: 2.3 10*3/uL (ref 0.7–4.0)
Monocytes Absolute: 0.6 10*3/uL (ref 0.1–1.0)
Monocytes Relative: 7 %
Neutro Abs: 6.1 10*3/uL (ref 1.7–7.7)
Neutrophils Relative %: 67 %

## 2020-03-29 LAB — GLUCOSE, CAPILLARY: Glucose-Capillary: 162 mg/dL — ABNORMAL HIGH (ref 70–99)

## 2020-03-29 LAB — HIV ANTIBODY (ROUTINE TESTING W REFLEX): HIV Screen 4th Generation wRfx: NONREACTIVE

## 2020-03-29 LAB — PROTIME-INR
INR: 1.1 (ref 0.8–1.2)
Prothrombin Time: 13.5 seconds (ref 11.4–15.2)

## 2020-03-29 LAB — APTT: aPTT: 28 seconds (ref 24–36)

## 2020-03-29 LAB — CBG MONITORING, ED: Glucose-Capillary: 120 mg/dL — ABNORMAL HIGH (ref 70–99)

## 2020-03-29 LAB — SARS CORONAVIRUS 2 BY RT PCR (HOSPITAL ORDER, PERFORMED IN ~~LOC~~ HOSPITAL LAB): SARS Coronavirus 2: NEGATIVE

## 2020-03-29 LAB — TSH: TSH: 0.751 u[IU]/mL (ref 0.350–4.500)

## 2020-03-29 MED ORDER — SENNOSIDES-DOCUSATE SODIUM 8.6-50 MG PO TABS: 1.0000 | ORAL_TABLET | Freq: Every evening | ORAL | Status: DC | PRN

## 2020-03-29 MED ORDER — SODIUM CHLORIDE 0.9% FLUSH
3.0000 mL | Freq: Once | INTRAVENOUS | Status: AC
  Administered 2020-03-29: 3 mL via INTRAVENOUS

## 2020-03-29 MED ORDER — INSULIN ASPART 100 UNIT/ML ~~LOC~~ SOLN
0.0000 [IU] | Freq: Three times a day (TID) | SUBCUTANEOUS | Status: DC
  Administered 2020-03-30 – 2020-03-31 (×3): 2 [IU] via SUBCUTANEOUS
  Administered 2020-04-01: 3 [IU] via SUBCUTANEOUS
  Administered 2020-04-01: 5 [IU] via SUBCUTANEOUS

## 2020-03-29 MED ORDER — ACETAMINOPHEN 325 MG PO TABS: 650.0000 mg | ORAL_TABLET | ORAL | Status: DC | PRN

## 2020-03-29 MED ORDER — STROKE: EARLY STAGES OF RECOVERY BOOK
Freq: Once | Status: AC
  Filled 2020-03-29: qty 1

## 2020-03-29 MED ORDER — ENOXAPARIN SODIUM 40 MG/0.4ML ~~LOC~~ SOLN
40.0000 mg | SUBCUTANEOUS | Status: DC
  Administered 2020-03-29 – 2020-03-31 (×3): 40 mg via SUBCUTANEOUS
  Filled 2020-03-29 (×3): qty 0.4

## 2020-03-29 MED ORDER — ACETAMINOPHEN 160 MG/5ML PO SOLN: 650.0000 mg | ORAL | Status: DC | PRN

## 2020-03-29 MED ORDER — ATORVASTATIN CALCIUM 40 MG PO TABS
40.0000 mg | ORAL_TABLET | Freq: Every day | ORAL | Status: DC
  Administered 2020-03-29 – 2020-04-01 (×4): 40 mg via ORAL
  Filled 2020-03-29 (×4): qty 1

## 2020-03-29 MED ORDER — ASPIRIN 300 MG RE SUPP: 300.0000 mg | Freq: Every day | RECTAL | Status: DC

## 2020-03-29 MED ORDER — ASPIRIN 325 MG PO TABS
325.0000 mg | ORAL_TABLET | Freq: Every day | ORAL | Status: DC
  Administered 2020-03-29 – 2020-04-01 (×4): 325 mg via ORAL
  Filled 2020-03-29 (×4): qty 1

## 2020-03-29 MED ORDER — ACETAMINOPHEN 650 MG RE SUPP: 650.0000 mg | RECTAL | Status: DC | PRN

## 2020-03-29 MED ORDER — SODIUM CHLORIDE 0.9 % IV SOLN: INTRAVENOUS | Status: DC

## 2020-03-29 MED ORDER — INSULIN ASPART 100 UNIT/ML ~~LOC~~ SOLN: 0.0000 [IU] | Freq: Every day | SUBCUTANEOUS | Status: DC

## 2020-03-29 NOTE — ED Provider Notes (Signed)
[facility] EMERGENCY DEPARTMENT Provider Note   CSN: [id number] Arrival date & time: 03/29/20  1047     History Chief Complaint  Patient presents with  . Aphasia  . Cerebrovascular Accident    Sonya George is a 64 y.o. female who presents for difficulty with speech. The patient has a hx of Previous stroke and DM. She speaks Twi  ([place]) language and  History is gathered from the patient's daughter by phone.  History is limited by the patient's aphasia and language barrier.  There is a level 5 caveat.  Patient's daughter states that yesterday she began acting confused and could not remember the daughter's name.  She kept calling her the name of other relatives all day long.  She states that this morning she was not even able to get real words out.  She states that she has chronic body aches but today just kept pointing to her left leg but was unable to use words.  She has no recent injuries or falls.  The patient's daughters states that she did not notice facial droop or unilateral weakness.  She was ambulatory today. According to EMR she is on daily ASA for previous CVA.   HPI     Past Medical History:  Diagnosis Date  . Diabetes mellitus without complication ([place])   . Hypertension   . Stroke [facility])     Patient Active Problem List   Diagnosis Date Noted  . Diabetes ([place]) 01/29/2019  . Essential hypertension 01/29/2019    No past surgical history on file.   OB History   No obstetric history on file.     Family History  Problem Relation Age of Onset  . Healthy Mother   . Healthy Father     Social History   Tobacco Use  . Smoking status: Never Smoker  . Smokeless tobacco: Never Used  Vaping Use  . Vaping Use: Never used  Substance Use Topics  . Alcohol use: Not Currently  . Drug use: Never    Home Medications Prior to Admission medications   Medication Sig Start Date End Date Taking? Authorizing Provider  amLODipine (NORVASC) 10 MG  tablet Take 1 tablet (10 mg total) by mouth daily. 12/14/19  Yes Maximiano Coss, NP  lisinopril (ZESTRIL) 10 MG tablet Take 1 tablet (10 mg total) by mouth daily. 12/14/19  Yes Maximiano Coss, NP  metFORMIN (GLUCOPHAGE) 500 MG tablet Take 2 tablets (1,000 mg total) by mouth 2 (two) times daily with a meal. 12/14/19 06/11/20 Yes Maximiano Coss, NP  Multiple Vitamins-Minerals (MULTIVITAMIN WITH MINERALS) tablet Take 1 tablet by mouth daily.   Yes [provider]    Allergies    Patient has no known allergies.  Review of Systems   Review of Systems  Unable to perform ROS: Patient nonverbal    Physical Exam Updated Vital Signs BP 139/78   Pulse 85   Temp 98.6 F (37 C)   Resp (!) 22   SpO2 98%   Physical Exam Vitals and nursing note reviewed.  Constitutional:      General: She is not in acute distress.    Appearance: She is well-developed. She is not diaphoretic.  HENT:     Head: Normocephalic and atraumatic.  Eyes:     General: No scleral icterus.    Conjunctiva/sclera: Conjunctivae normal.  Cardiovascular:     Rate and Rhythm: Normal rate and regular rhythm.     Heart sounds: Normal heart sounds. No murmur heard.  No friction rub. No gallop.   Pulmonary:     Effort: Pulmonary effort is normal. No respiratory distress.     Breath sounds: Normal breath sounds.  Abdominal:     General: Bowel sounds are normal. There is no distension.     Palpations: Abdomen is soft. There is no mass.     Tenderness: There is no abdominal tenderness. There is no guarding.  Musculoskeletal:     Cervical back: Normal range of motion.  Skin:    General: Skin is warm and dry.  Neurological:     Mental Status: She is alert.     Comments: No obvious facial droop-Movements appears symmetric Equal BL grip strenght- 5/5 upper and lower extremities Multiple prompts to get the patient to squeeze with the R hand- ? Mild neglect Patient ambulatory.  Psychiatric:        Behavior: Behavior  normal.     ED Results / Procedures / Treatments   Labs (all labs ordered are listed, but only abnormal results are displayed) Labs Reviewed  COMPREHENSIVE METABOLIC PANEL - Abnormal; Notable for the following components:      Result Value   Glucose, Bld 138 (*)    All other components within normal limits  SARS CORONAVIRUS 2 BY RT PCR [facility] ORDER, [place] LAB)  PROTIME-INR  APTT  CBC  DIFFERENTIAL    EKG EKG Interpretation  Date/Time:  Saturday March 29 2020 10:47:06 EDT Ventricular Rate:  96 PR Interval:  126 QRS Duration: 86 QT Interval:  360 QTC Calculation: 454 R Axis:   -18 Text Interpretation: Normal sinus rhythm Moderate voltage criteria for LVH, may be normal variant ( R in aVL , Cornell product ) Nonspecific ST abnormality Abnormal ECG Confirmed by Quintella Reichert [phone]) on 03/29/2020 2:10:09 PM   Radiology CT HEAD WO CONTRAST  Result Date: 03/29/2020 CLINICAL DATA:  Neuro deficit. Acute stroke suspected. Aphasia. The patient's symptoms have been present for 2 days. EXAM: CT HEAD WITHOUT CONTRAST TECHNIQUE: Contiguous axial images were obtained from the base of the skull through the vertex without intravenous contrast. COMPARISON:  None. FINDINGS: Brain: No subdural, epidural, and subarachnoid hemorrhage. The patient has multiple infarcts. There appears to be a large subacute infarct in the left MCA territory involving the frontal and parietal lobes. An area of encephalomalacia in the left frontal lobe probably represents sequela of previous infarct. Encephalomalacia in the left occipital lobe likely represents a previous remote infarct. A small infarct in the right occipital lobe also has associated cephalo malacia suggesting it is nonacute. An age indeterminate infarct is seen in the right posterior parietal lobe on series 3, image 14. Encephalomalacia in the posterior right frontal lobe on series 3, image 24 is consistent with another  site of previous infarct. There is an infarct in the posterior left cerebellar hemisphere. The cerebellum, brainstem, and basal cisterns are otherwise normal. Small lacunar infarct in the right thalamus. Small lacunar infarct in the left basal ganglia on axial image 12. Ventricles are unremarkable. Sulci are unremarkable. No midline shift. Vascular: No hyperdense vessel or unexpected calcification. Skull: Normal. Negative for fracture or focal lesion. Sinuses/Orbits: No acute finding. Other: None. IMPRESSION: 1. Multiple bilateral infarcts. The largest infarct involves the left frontal and parietal lobes and appears subacute consistent with history of 2 days of symptoms. Multiple infarcts have associated encephalomalacia suggesting prior remote infarcts in many locations. 2. No other acute intracranial abnormalities are identified. Electronically Signed   By: Shanon Brow  Jimmye Norman III M.D   On: 03/29/2020 12:52    Procedures .Critical Care Performed by: Margarita Mail, PA-C Authorized by: Margarita Mail, PA-C   Critical care provider statement:    Critical care time (minutes):  45   Critical care time was exclusive of:  Separately billable procedures and treating other patients   Critical care was necessary to treat or prevent imminent or life-threatening deterioration of the following conditions:  CNS failure or compromise   Critical care was time spent personally by me on the following activities:  Discussions with consultants, evaluation of patient's response to treatment, examination of patient, ordering and performing treatments and interventions, ordering and review of laboratory studies, ordering and review of radiographic studies, pulse oximetry, re-evaluation of patient's condition, obtaining history from patient or surrogate and review of old charts   (including critical care time)  Medications Ordered in ED Medications  sodium chloride flush (NS) 0.9 % injection 3 mL (3 mLs Intravenous Given  03/29/20 1327)    ED Course  I have reviewed the triage vital signs and the nursing notes.  Pertinent labs & imaging results that were available during my care of the patient were reviewed by me and considered in my medical decision making (see chart for details).    MDM Rules/Calculators/A&P                         Final Clinical Impression(s) / ED Diagnoses Final diagnoses:  Acute CVA (cerebrovascular accident) ([place])  \  CC: AMS/ apahsia VS: BP 139/78   Pulse 85   Temp 98.6 F (37 C)   Resp (!) 22   SpO2 98%   LP:FXTKWIO is gathered by [employer] and phone call with daughter. Previous records obtained and reviewed. DDX:The patient's complaint of a involves an extensive number of diagnostic and treatment options, and is a complaint that carries with it a high risk of complications, morbidity, and potential mortality. Given the large differential diagnosis, medical decision making is of high complexity. .The differential diagnosis for AMS is extensive and includes, but is not limited to: drug overdose - opioids, alcohol, sedatives, antipsychotics, drug withdrawal, others; Metabolic: hypoxia, hypoglycemia, hyperglycemia, hypercalcemia, hypernatremia, hyponatremia, uremia, hepatic encephalopathy, hypothyroidism, hyperthyroidism, vitamin B12 or thiamine deficiency, carbon monoxide poisoning, Wilson's disease, Lactic acidosis, DKA/HHOS; Infectious: meningitis, encephalitis, bacteremia/sepsis, urinary tract infection, pneumonia, neurosyphilis; Structural: Space-occupying lesion, (brain tumor, subdural hematoma, hydrocephalus,); Vascular: stroke, subarachnoid hemorrhage, coronary ischemia, hypertensive encephalopathy, CNS vasculitis, thrombotic thrombocytopenic purpura, disseminated intravascular coagulation, hyperviscosity; Psychiatric: Schizophrenia, depression; Other: Seizure, hypothermia, heat stroke, ICU psychosis, dementia -"sundowning."  Labs: I ordered reviewed and interpreted labs which include  CMP which shows mildly elevated blood glucose of insignificant value, CBC without abnormality, PT/INR APTT also without abnormality.  Covid test is pending. Imaging: I ordered and reviewed images which included CT head. I independently visualized and interpreted all imaging. Significant findings include multiple BL infarcts.  EKG:NSR at a rate of 98 Consults:Dr. Cheral Marker of neurology and Dr. Karmen Bongo of [facility] MDM: Patient here with onset of aphasia.  She has multiple bilateral infarcts suggestive of embolic stroke.  Patient will be admitted to the hospitalist service.  Case discussed with Dr. Cheral Marker of the neurology service who will consult on the patient.  Case also discussed with Dr. Karmen Bongo will admit the patient.  She was stable throughout her visit here. Patient disposition:Admit The patient appears reasonably stabilized for admission considering the current resources, flow, and capabilities available in the  ED at this time, and I doubt any other [facility] requiring further screening and/or treatment in the ED prior to admission.  Sonya George was evaluated in Emergency Department on 03/29/2020 for the symptoms described in the history of present illness. She was evaluated in the context of the global COVID-19 pandemic, which necessitated consideration that the patient might be at risk for infection with the SARS-CoV-2 virus that causes COVID-19. Institutional protocols and algorithms that pertain to the evaluation of patients at risk for COVID-19 are in a state of rapid change based on information released by regulatory bodies including the CDC and federal and state organizations. These policies and algorithms were followed during the patient's care in the ED.  Rx / DC Orders ED Discharge Orders    None       Margarita Mail, PA-C 03/29/20 1513    Quintella Reichert, MD 03/29/20 [phone]

## 2020-03-29 NOTE — ED Triage Notes (Signed)
EMS stated, she is asphasia, has been this way for 2 days. Last normal was 2 days ago.  Has a confused look. When using her native language only uses 2-3 words, nothing is coordinating. Had a stroke 5-7 years ago.   CBG 129 EKG - normal

## 2020-03-29 NOTE — ED Notes (Signed)
Pt speaks the language "Twi" according to pt's daughter on the phone while speaking with EDP Harris.

## 2020-03-29 NOTE — ED Notes (Signed)
Called pt's daughter twice, call was forwarded both times.

## 2020-03-29 NOTE — Consult Note (Addendum)
NEURO HOSPITALIST CONSULT NOTE   Requestig physician: Dr. Ralene Bathe  Reason for Consult: Multiple acute bilateral cerebral infarctions  History obtained from:  Chart     HPI:                                                                                                                                          Sonya George is an 64 y.o. female non-English speaking (from [place], speaks Twi), with a history of DM, HTN and stroke 5-7 years ago who presented with a 2 day history of aphasia. On arrival she appeared confused and when using her own language could use only 2-3 words. History was obtained by EDP from the patient's daughter by phone. Her daughter stated that yesterday she began acting confused and could not remember her daughter's name.  She kept calling her the name of other relatives throughout the day. This progressed to the point that this morning the patient was not even able to get real words out. She has chronic body aches but today just kept pointing to her left leg but was unable to use words to describe her symptoms. No recent falls or injuries. The daughter did not notice any facial droop or unilateral weakness. The patient was able to ambulate on presentation.  At time of Neurology evaluation, the patient is globally aphasic despite assistance from an RN who speaks Twi.    Past Medical History:  Diagnosis Date  . Diabetes mellitus without complication ([place])   . Hypertension   . Stroke [facility])     No past surgical history on file.  Family History  Problem Relation Age of Onset  . Healthy Mother   . Healthy Father               Social History:  reports that she has never smoked. She has never used smokeless tobacco. She reports previous alcohol use. She reports that she does not use drugs.  No Known Allergies  Home Medications: No current facility-administered medications on file prior to encounter.   Current Outpatient Medications on File Prior  to Encounter  Medication Sig Dispense Refill  . amLODipine (NORVASC) 10 MG tablet Take 1 tablet (10 mg total) by mouth daily. 90 tablet 1  . lisinopril (ZESTRIL) 10 MG tablet Take 1 tablet (10 mg total) by mouth daily. 90 tablet 1  . metFORMIN (GLUCOPHAGE) 500 MG tablet Take 2 tablets (1,000 mg total) by mouth 2 (two) times daily with a meal. 360 tablet 1  . Multiple Vitamins-Minerals (MULTIVITAMIN WITH MINERALS) tablet Take 1 tablet by mouth daily.      INPATIENT MEDICATIONS:  Scheduled: . aspirin  300 mg Rectal Daily   Or  . aspirin  325 mg Oral Daily  . atorvastatin  40 mg Oral Daily  . enoxaparin (LOVENOX) injection  40 mg Subcutaneous Q24H  . insulin aspart  0-15 Units Subcutaneous TID WC  . insulin aspart  0-5 Units Subcutaneous QHS   Continuous: . sodium chloride 50 mL/hr at 03/29/20 1649    ROS:                                                                                                                                       Unable to obtain due to aphasia.    Blood pressure (!) 159/78, pulse 98, temperature 98.6 F (37 C), resp. rate 12, SpO2 100 %.   General Examination:                                                                                                       Physical Exam  HEENT-  [place]/AT    Lungs- Respirations unlabored Extremities- No edema  Neurological Examination Mental Status: Awake and alert. Smiles at examiner at times, but is unable to produce intelligible words in Twi per RN who speaks this language. Unable to follow any commands given to her in Twi, but can follow about 10% of simple commands that are pantomimed to her.  Cranial Nerves: II: Will fixate on examiner's face at times. PERRL. Not cooperative with testing of blink to threat.    III,IV, VI: No ptosis. Spontaneous EOM are full during the exam. No forced deviation or  nystagmus.   V,VII: Smile symmetric. Reacts to tactile stimulation. VIII: Will gaze towards RN and Neurology attending when they ask her questions.  IX,X: Unable to assess.  XI: Grossly symmetric.  XII: Midline tongue extension when pantomimed for her.  Motor: Moves all 4 extremities equally. Unable to follow commands for formal assessment. Sensory: Reacts to light pinch in all 4 extremities.  Deep Tendon Reflexes: Moves and adjusts her position in bed through most of the exam - unable to test due to movement.  Cerebellar: No gross ataxia noted.  Gait: Unable to assess.   Lab Results: Basic Metabolic Panel: Recent Labs  Lab 03/29/20 1128  NA 139  K 4.3  CL 105  CO2 25  GLUCOSE 138*  BUN 9  CREATININE 0.67  CALCIUM 9.6    CBC: Recent Labs  Lab 03/29/20 1128  WBC 9.1  NEUTROABS 6.1  HGB 13.1  HCT 40.0  MCV 90.9  PLT 278    Cardiac Enzymes: No results for input(s): CKTOTAL, CKMB, CKMBINDEX, TROPONINI in the last 168 hours.  Lipid Panel: No results for input(s): CHOL, TRIG, HDL, CHOLHDL, VLDL, LDLCALC in the last 168 hours.  Imaging: CT HEAD WO CONTRAST  Result Date: 03/29/2020 CLINICAL DATA:  Neuro deficit. Acute stroke suspected. Aphasia. The patient's symptoms have been present for 2 days. EXAM: CT HEAD WITHOUT CONTRAST TECHNIQUE: Contiguous axial images were obtained from the base of the skull through the vertex without intravenous contrast. COMPARISON:  None. FINDINGS: Brain: No subdural, epidural, and subarachnoid hemorrhage. The patient has multiple infarcts. There appears to be a large subacute infarct in the left MCA territory involving the frontal and parietal lobes. An area of encephalomalacia in the left frontal lobe probably represents sequela of previous infarct. Encephalomalacia in the left occipital lobe likely represents a previous remote infarct. A small infarct in the right occipital lobe also has associated cephalo malacia suggesting it is nonacute. An  age indeterminate infarct is seen in the right posterior parietal lobe on series 3, image 14. Encephalomalacia in the posterior right frontal lobe on series 3, image 24 is consistent with another site of previous infarct. There is an infarct in the posterior left cerebellar hemisphere. The cerebellum, brainstem, and basal cisterns are otherwise normal. Small lacunar infarct in the right thalamus. Small lacunar infarct in the left basal ganglia on axial image 12. Ventricles are unremarkable. Sulci are unremarkable. No midline shift. Vascular: No hyperdense vessel or unexpected calcification. Skull: Normal. Negative for fracture or focal lesion. Sinuses/Orbits: No acute finding. Other: None. IMPRESSION: 1. Multiple bilateral infarcts. The largest infarct involves the left frontal and parietal lobes and appears subacute consistent with history of 2 days of symptoms. Multiple infarcts have associated encephalomalacia suggesting prior remote infarcts in many locations. 2. No other acute intracranial abnormalities are identified. Electronically Signed   By: Dorise Bullion III M.D   On: 03/29/2020 12:52    Assessment: 64 year old female with multiple bilateral cerebral infarctions.  1. Exam reveals dense expressive and receptive aphasia.  2. CT head: Multiple bilateral infarcts. The largest infarct involves the left frontal and parietal lobes and appears subacute consistent with history of 2 days of symptoms. Multiple infarcts have associated encephalomalacia suggesting prior remote infarcts in many locations. No other acute intracranial abnormalities are identified. 3. Based on the new stroke and multiple old strokes in separate vascular territories, a cardioembolic etiology is suspected.  4. Stroke risk factors: HTN, DM and prior stroke  Recommendations: 1. Agree with starting ASA and atorvastatin.  2. BP management. Out of permissive HTN time window. 3. PT/OT/Speech 4. Carotid ultrasound 5. TTE 6.  Cardiac telemetry 7. IVF 8. HgbA1c, fasting lipid panel 9. Risk factor modification 10. Frequent neuro checks 11. NPO until passes stroke swallow screen   Electronically signed: Dr. Kerney Elbe 03/29/2020, 1:55 PM

## 2020-03-29 NOTE — H&P (Signed)
History and Physical    Sonya George ZOX:[id number] DOB: [birth year] DOA: 03/29/2020  PCP: Maximiano Coss, NP Consultants:  None Patient coming from:  Home - lives with ; [facility]: Daughter, Burman Riis, [phone]  Chief Complaint: aphasia  HPI: Sonya George is a 64 y.o. female with medical history significant of CVA; HTN; and DM presenting with aphasia.  She started acting not like herself, forgot her daughter's name and called her other family member's names.  She was also having trouble communicating with her daughter.  Her last stroke in [place] also affected her speech.  She also hasn't been eating and has been acting abnormal and can't communicate.  She has been acting this way for a couple of days but it got worse last night.  She hasn't eaten much for 3 days, rejecting food, and so not sure if difficulty swallowing.  She complained of weakness in her R > L leg (but also sometimes L leg too).  +headache.  She speaks Twi, from [place].  She was not taking daily ASA.    ED Course:  Multiple bilateral infarcts.  Patient speaks "Twi."  Daughter gives history - confused, calling wrong names, today aphasic.  CT abnormal.  Unable to get good exam.  Neurology recommends stroke work-up.  On ASA prior.  Review of Systems: Unable to perform   Ambulatory Status:  Ambulates without assistance  COVID Vaccine Status:   Complete  Past Medical History:  Diagnosis Date  . Diabetes mellitus without complication ([place])   . Hypertension   . Stroke [facility])     History reviewed. No pertinent surgical history.  Social History   Socioeconomic History  . Marital status: Married    Spouse name: Not on file  . Number of children: Not on file  . Years of education: Not on file  . Highest education level: Not on file  Occupational History  . Occupation: unemployed  Tobacco Use  . Smoking status: Never Smoker  . Smokeless tobacco: Never Used  Vaping Use  . Vaping Use: Never used    Substance and Sexual Activity  . Alcohol use: Never  . Drug use: Never  . Sexual activity: Not on file  Other Topics Concern  . Not on file  Social History Narrative  . Not on file   Social Determinants of Health   Financial Resource Strain:   . Difficulty of Paying Living Expenses: Not on file  Food Insecurity:   . Worried About Charity fundraiser in the Last Year: Not on file  . Ran Out of Food in the Last Year: Not on file  Transportation Needs:   . Lack of Transportation (Medical): Not on file  . Lack of Transportation (Non-Medical): Not on file  Physical Activity:   . Days of Exercise per Week: Not on file  . Minutes of Exercise per Session: Not on file  Stress:   . Feeling of Stress : Not on file  Social Connections:   . Frequency of Communication with Friends and Family: Not on file  . Frequency of Social Gatherings with Friends and Family: Not on file  . Attends Religious Services: Not on file  . Active Member of Clubs or Organizations: Not on file  . Attends Archivist Meetings: Not on file  . Marital Status: Not on file  Intimate Partner Violence:   . Fear of Current or Ex-Partner: Not on file  . Emotionally Abused: Not on file  . Physically Abused: Not on file  .  Sexually Abused: Not on file    No Known Allergies  Family History  Problem Relation Age of Onset  . Stroke Sister        Actively dying of CVA  . Stroke Brother        Died 2 weeks ago of CVA    Prior to Admission medications   Medication Sig Start Date End Date Taking? Authorizing Provider  amLODipine (NORVASC) 10 MG tablet Take 1 tablet (10 mg total) by mouth daily. 12/14/19  Yes Maximiano Coss, NP  lisinopril (ZESTRIL) 10 MG tablet Take 1 tablet (10 mg total) by mouth daily. 12/14/19  Yes Maximiano Coss, NP  metFORMIN (GLUCOPHAGE) 500 MG tablet Take 2 tablets (1,000 mg total) by mouth 2 (two) times daily with a meal. 12/14/19 06/11/20 Yes Maximiano Coss, NP  Multiple  Vitamins-Minerals (MULTIVITAMIN WITH MINERALS) tablet Take 1 tablet by mouth daily.   Yes [provider]    Physical Exam: Vitals:   03/29/20 1345 03/29/20 1400 03/29/20 1415 03/29/20 1430  BP: (!) 147/77 (!) 144/95 138/80 139/78  Pulse: 86 89 84 85  Resp: 13 13 13  (!) 22  Temp:      SpO2: 100% 99% 99% 98%     . General:  Appears calm and comfortable and is NAD . Eyes:  PERRL, EOMI, normal lids, iris . ENT:  grossly normal hearing, lips & tongue, mmm . Neck:  no LAD, masses or thyromegaly; no carotid bruits . Cardiovascular:  RRR, no m/r/g. No LE edema.  Marland Kitchen Respiratory:   CTA bilaterally with no wheezes/rales/rhonchi.  Normal respiratory effort. . Abdomen:  soft, NT, ND, NABS . Skin:  no rash or induration seen on limited exam . Musculoskeletal:  grossly normal tone BUE/BLE, good ROM, no bony abnormality . Lower extremity:  No LE edema.  Limited foot exam with no ulcerations.  2+ distal pulses. Marland Kitchen Psychiatric:  grossly normal mood and affect, speech limited to "fine" . Neurologic:  CN 2-12 grossly intact, moves all extremities in coordinated fashion - extremely limited by language barrier, aphasia, CVA    Radiological Exams on Admission: CT HEAD WO CONTRAST  Result Date: 03/29/2020 CLINICAL DATA:  Neuro deficit. Acute stroke suspected. Aphasia. The patient's symptoms have been present for 2 days. EXAM: CT HEAD WITHOUT CONTRAST TECHNIQUE: Contiguous axial images were obtained from the base of the skull through the vertex without intravenous contrast. COMPARISON:  None. FINDINGS: Brain: No subdural, epidural, and subarachnoid hemorrhage. The patient has multiple infarcts. There appears to be a large subacute infarct in the left MCA territory involving the frontal and parietal lobes. An area of encephalomalacia in the left frontal lobe probably represents sequela of previous infarct. Encephalomalacia in the left occipital lobe likely represents a previous remote infarct. A small  infarct in the right occipital lobe also has associated cephalo malacia suggesting it is nonacute. An age indeterminate infarct is seen in the right posterior parietal lobe on series 3, image 14. Encephalomalacia in the posterior right frontal lobe on series 3, image 24 is consistent with another site of previous infarct. There is an infarct in the posterior left cerebellar hemisphere. The cerebellum, brainstem, and basal cisterns are otherwise normal. Small lacunar infarct in the right thalamus. Small lacunar infarct in the left basal ganglia on axial image 12. Ventricles are unremarkable. Sulci are unremarkable. No midline shift. Vascular: No hyperdense vessel or unexpected calcification. Skull: Normal. Negative for fracture or focal lesion. Sinuses/Orbits: No acute finding. Other: None. IMPRESSION: 1. Multiple bilateral  infarcts. The largest infarct involves the left frontal and parietal lobes and appears subacute consistent with history of 2 days of symptoms. Multiple infarcts have associated encephalomalacia suggesting prior remote infarcts in many locations. 2. No other acute intracranial abnormalities are identified. Electronically Signed   By: Dorise Bullion III M.D   On: 03/29/2020 12:52    EKG: Independently reviewed.  NSR with rate 96; nonspecific ST changes with no evidence of acute ischemia   Labs on Admission: I have personally reviewed the available labs and imaging studies at the time of the admission.  Pertinent labs:   Glucose 138, otherwise normal CMP Normal CBC INR 1.1 A1c 6.8 on 12/14/19 COVID pending   Assessment/Plan Principal Problem:   CVA (cerebral vascular accident) ([place]) Active Problems:   Diabetes ([place])   Essential hypertension    CVA -Patient with reported prior h/o CVA presenting with aphasia and confusion for several days but worse last night -Concerning for CVA -CT confirms multiple B infarcts -Will admit for further CVA evaluation -Telemetry  monitoring -MRI/MRA -Carotid dopplers -Echo -Risk stratification with FLP; will also check TSH and UDS -ASA daily (has not been taking regularly so this does not appear to be ASA failure) -Neurology consult -PT/OT/ST/Nutrition Consults  HTN -Allow permissive HTN for now -Treat BP only if >220/120, and then with goal of 15% reduction -Hold Norvasc, Lisinopril and plan to restart in 48-72 hours   HLD -Check FLP -Start Lipitor 40 mg daily   DM -Recent A1c shows reasonably good control -Hold home PO Glucophage -Will order moderate-scale SSI       Note: This patient has been tested and is pending for the novel coronavirus COVID-19.     DVT prophylaxis:  Lovenox  Code Status: Full  Family Communication: None present; I spoke with her daughter by telephone at the time of admission Disposition Plan:  The patient is from: home  Anticipated d/c is to: home without [facility] services   Anticipated d/c date will depend on clinical response to treatment, likely several days depending on the severity of her deficits  Patient is currently: acutely ill Consults called: Neurology; PT/OT/ST/Nutrition  Admission status: Admit - It is my clinical opinion that admission to [place] is reasonable and necessary because of the expectation that this patient will require hospital care that crosses at least 2 midnights to treat this condition based on the medical complexity of the problems presented.  Given the aforementioned information, the predictability of an adverse outcome is felt to be significant.    Karmen Bongo MD [facility]   How to contact the [facility] Attending or Consulting provider [place] or covering provider during after hours [place], for this patient?  1. Check the care team in [facility] and look for a) attending/consulting [facility] provider listed and b) the [facility] team listed 2. Log into [email or web address] and use [place]'s universal password to access. If you do not have the password,  please contact the hospital operator. 3. Locate the [facility] provider you are looking for under [facility] and page to a number that you can be directly reached. 4. If you still have difficulty reaching the provider, please page the [facility] (Director on Call) for the Hospitalists listed on amion for assistance.   03/29/2020, 3:39 PM

## 2020-03-30 ENCOUNTER — Inpatient Hospital Stay (HOSPITAL_COMMUNITY): Payer: Medicaid Other

## 2020-03-30 DIAGNOSIS — I351 Nonrheumatic aortic (valve) insufficiency: Secondary | ICD-10-CM

## 2020-03-30 DIAGNOSIS — I361 Nonrheumatic tricuspid (valve) insufficiency: Secondary | ICD-10-CM

## 2020-03-30 DIAGNOSIS — E119 Type 2 diabetes mellitus without complications: Secondary | ICD-10-CM

## 2020-03-30 DIAGNOSIS — I639 Cerebral infarction, unspecified: Secondary | ICD-10-CM

## 2020-03-30 LAB — ECHOCARDIOGRAM COMPLETE
Area-P 1/2: 4.06 cm2
P 1/2 time: 552 msec
S' Lateral: 3.4 cm

## 2020-03-30 LAB — GLUCOSE, CAPILLARY
Glucose-Capillary: 123 mg/dL — ABNORMAL HIGH (ref 70–99)
Glucose-Capillary: 116 mg/dL — ABNORMAL HIGH (ref 70–99)
Glucose-Capillary: 128 mg/dL — ABNORMAL HIGH (ref 70–99)
Glucose-Capillary: 152 mg/dL — ABNORMAL HIGH (ref 70–99)

## 2020-03-30 LAB — LIPID PANEL
Cholesterol: 149 mg/dL (ref 0–200)
HDL: 38 mg/dL — ABNORMAL LOW (ref >40)
LDL Cholesterol: 95 mg/dL (ref 0–99)
Total CHOL/HDL Ratio: 3.9 RATIO
Triglycerides: 82 mg/dL (ref <150)
VLDL: 16 mg/dL (ref 0–40)

## 2020-03-30 LAB — URINALYSIS, ROUTINE W REFLEX MICROSCOPIC
Bilirubin Urine: NEGATIVE
Glucose, UA: 50 mg/dL — AB
Hgb urine dipstick: NEGATIVE
Ketones, ur: NEGATIVE mg/dL
Leukocytes,Ua: NEGATIVE
Nitrite: NEGATIVE
Protein, ur: NEGATIVE mg/dL
Specific Gravity, Urine: 1.005 (ref 1.005–1.030)
pH: 7 (ref 5.0–8.0)

## 2020-03-30 NOTE — Progress Notes (Signed)
Occupational Therapy Evaluation Patient Details Name: Sonya George MRN: [id number] DOB: 03/11/1956 Today's Date: 03/30/2020    History of Present Illness 64 y.o. female with medical history significant of CVA; HTN; and DM presenting with aphasia. MRI +acute/subacute L parietal lobe with extensive edema ; chronic hemorrhagic infarct L frontal lobe; chronic infarcts B occipital lobes L greater than R; chronic infarct L cerebellum   Clinical Impression   PTA, pt lived at home with daughter and was independent with ADL tasks. Pt enjoyed cleaning and assisting with household chores. Pt has lived in the [place] with her daughter for 2 years. 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 Daughter present for session. Pt with apparent expressive language deficits which are appropriately frustrating for pt. She demonstrates apparent limb apraxia and cognitive deficits in addition to significant functional vision loss from previous CVA , decreasing her safety and independence with ADL and IADL tasks. Daughter will be able to transport her to outpt OT/ST sessions and interpret for her mother. Daughter has made plans so that her mother has 24/7 S. Discussed DC plan with MD and Nsg. Will follow acutely.     Follow Up Recommendations  Outpatient OT;Supervision/Assistance - 24 hour (neuro outpt)    Equipment Recommendations  None recommended by OT    Recommendations for Other Services       Precautions / Restrictions Precautions Precautions: Fall Restrictions Weight Bearing Restrictions: No      Mobility Bed Mobility Overal bed mobility: Modified Independent             General bed mobility comments: no bed mobility assessed, pt received sitting at edge of bed and left in same position  Transfers Overall transfer level: Needs assistance Equipment used: None Transfers: Sit to/from Stand;Stand Pivot Transfers Sit to Stand: Min guard         General transfer comment: No LOB  observe; miniguard due to visual deficits/unfamiliar environment    Balance Overall balance assessment: Needs assistance Sitting-balance support: No upper extremity supported;Feet supported Sitting balance-Leahy Scale: Good     Standing balance support: No upper extremity supported Standing balance-Leahy Scale: Fair                             ADL either performed or assessed with clinical judgement   ADL Overall ADL's : Needs assistance/impaired Eating/Feeding: Set up   Grooming: Minimal assistance   Upper Body Bathing: Minimal assistance;Sitting   Lower Body Bathing: Minimal assistance;Sit to/from stand   Upper Body Dressing : Moderate assistance;Sitting   Lower Body Dressing: Moderate assistance;Sit to/from stand   Toilet Transfer: Ambulation;Min guard   Toileting- Clothing Manipulation and Hygiene: Minimal assistance;Sit to/from stand       Functional mobility during ADLs: Cueing for safety;Min guard  * pt wanting to go for a walk and walked into the bathroom thinking that she wa going into the hall.     Vision Baseline Vision/History: Wears glasses Wears Glasses: At all times Vision Assessment?: Yes Additional Comments: Pt with chronic occipital CVA with apparent visual field deficits; R field most likely more impaired than L; not seeing items and requires hand over hand to locateDifficult to assess due to communication      Perception Perception Comments: poor spatial awareness   Praxis Praxis Praxis tested?: Deficits Deficits: Limb apraxia;Perseveration;Ideomotor    Pertinent Vitals/Pain Pain Assessment: Faces Faces Pain Scale: No hurt Pain Descriptors / Indicators: Restless Pain Intervention(s): Monitored during  session     Hand Dominance Right   Extremity/Trunk Assessment Upper Extremity Assessment Upper Extremity Assessment: RUE deficits/detail RUE Deficits / Details: sensory motor deficits - alien arm type movemetns; better use of  RUE during spontaneous functional tasks RUE Sensation: decreased light touch;decreased proprioception RUE Coordination: decreased fine motor;decreased gross motor   Lower Extremity Assessment Lower Extremity Assessment: Defer to PT evaluation   Cervical / Trunk Assessment Cervical / Trunk Assessment: Normal   Communication Communication Communication: Prefers language other than English (Twi)   Cognition Arousal/Alertness: Awake/alert Behavior During Therapy: Anxious Overall Cognitive Status: Impaired/Different from baseline                                 General Comments:  appropriately frustrated at diffiuclty with communication; decreased awareness of deficits with RUE Was crying significantly during session saying "why why why" also communicating she wanted to go back to [place] Comments  VSS on RA, daughter, Lum Babe, present and providing interpretation services    Exercises     Shoulder Instructions      Home Living Family/patient expects to be discharged to:: Private residence Living Arrangements: Children Available Help at Discharge: Family;Available PRN/intermittently Type of Home: House Home Access: Stairs to enter Entrance Stairs-Number of Steps: ???   Home Layout: Two level;Able to live on main level with bedroom/bathroom Alternate Level Stairs-Number of Steps: flight   Bathroom Shower/Tub: Teacher, early years/pre: Standard     Home Equipment: None      Lives With: Daughter (grand children)    Prior Functioning/Environment Level of Independence: Independent                 OT Problem List: Impaired vision/perception;Decreased coordination;Decreased cognition;Decreased safety awareness;Decreased knowledge of use of DME or AE;Impaired sensation;Impaired UE functional use      OT Treatment/Interventions: Self-care/ADL training;Therapeutic exercise;Neuromuscular education;DME and/or AE instruction;Therapeutic  activities;Cognitive remediation/compensation;Visual/perceptual remediation/compensation;Patient/family education;Balance training    OT Goals(Current goals can be found in the care plan section) Acute Rehab OT Goals Patient Stated Goal: per daughter for her mother to get better OT Goal Formulation: With patient/family Time For Goal Achievement: 04/13/20 Potential to Achieve Goals: Good  OT Frequency: Min 2X/week   Barriers to D/C: Decreased caregiver support          Co-evaluation PT/OT/SLP Co-Evaluation/Treatment: Yes     OT goals addressed during session: ADL's and self-care      AM-PAC OT "6 Clicks" Daily Activity     Outcome Measure Help from another person eating meals?: A Little Help from another person taking care of personal grooming?: A Little Help from another person toileting, which includes using toliet, bedpan, or urinal?: A Little Help from another person bathing (including washing, rinsing, drying)?: A Little Help from another person to put on and taking off regular upper body clothing?: A Lot Help from another person to put on and taking off regular lower body clothing?: A Lot 6 Click Score: 16   End of Session Nurse Communication: Mobility status;Other (comment) (DC plan)  Activity Tolerance: Patient tolerated treatment well Patient left: in bed;with call bell/phone within reach;with family/visitor present                   Time: 1450-1534 OT Time Calculation (min): 44 min Charges:  OT General Charges $OT Visit: 1 Visit OT Evaluation $OT Eval Moderate Complexity: [place], OT/L   Acute  OT Clinical Specialist [place] Pager [phone] Office [phone]   [facility] 03/30/2020, 3:56 PM

## 2020-03-30 NOTE — Evaluation (Signed)
Physical Therapy Evaluation Patient Details Name: Sonya George MRN: [id number] DOB: 1956/02/04 Today's Date: 03/30/2020   History of Present Illness  64 y.o. female with medical history significant of CVA; HTN; and DM presenting with aphasia.  She started acting not like herself, forgot her daughter's name and called her other family member's names.  She was also having trouble communicating with her daughter.  Her last stroke in [place] also affected her speech.  She also hasn't been eating and has been acting abnormal and can't communicate.  She has been acting this way for a couple of days but it got worse last night. Pt also complains of RLE weakness, more significant than LLE weakenss. CT demonstrates multiple bilateral infarcts with largest in the left frontal and parietal lobes.  Clinical Impression  Pt presents to PT with deficits in functional mobility, gait, balance, cognition, communication, and strength. Pt's daughter is present and providing interpretation services during the session. Pt demonstrates more significant expressive aphasia qualities but also seems to demonstrate some expressive aphasia. Pt demonstrates poor safety awareness during mobility and poor attention, requiring verbal/tactile cues frequently during session as well as minA during gait due to impaired balance. Pt will benefit from continued acute PT POC to improve mobility quality in an effort to restore the pt's prior level of function. PT recommends CIR at this time to aide in a return to independence.    Follow Up Recommendations CIR    Equipment Recommendations   (TBD, will need to assess gait with RW vs Cane)    Recommendations for Other Services Rehab consult     Precautions / Restrictions Precautions Precautions: Fall Restrictions Weight Bearing Restrictions: No      Mobility  Bed Mobility               General bed mobility comments: no bed mobility assessed, pt received sitting at edge of  bed and left in same position  Transfers Overall transfer level: Needs assistance Equipment used: None Transfers: Sit to/from Stand Sit to Stand: Supervision            Ambulation/Gait Ambulation/Gait assistance: Min assist Gait Distance (Feet): 100 Feet Assistive device: IV Pole;None Gait Pattern/deviations: Step-to pattern;Wide base of support;Drifts right/left Gait velocity: reduced Gait velocity interpretation: <1.8 ft/sec, indicate of risk for recurrent falls General Gait Details: pt with increased lateral sway and drift during ambulation, pt requires constant cueing for direction and for safety awareness with impaired attention to task. Pt demonstrates limited safety awareness at times during mobility needing frequent cues for redirection.  Stairs            Wheelchair Mobility    Modified Rankin (Stroke Patients Only) Modified Rankin (Stroke Patients Only) Pre-Morbid Rankin Score: No significant disability Modified Rankin: Moderately severe disability     Balance Overall balance assessment: Needs assistance Sitting-balance support: No upper extremity supported;Feet supported Sitting balance-Leahy Scale: Good     Standing balance support: No upper extremity supported Standing balance-Leahy Scale: Fair                               Pertinent Vitals/Pain Pain Assessment: Faces Faces Pain Scale: Hurts a little bit Pain Descriptors / Indicators: Restless Pain Intervention(s): Monitored during session    Home Living Family/patient expects to be discharged to:: Private residence Living Arrangements: Children Available Help at Discharge: Family;Available PRN/intermittently (daughter works, no other caregiver ID'd.) Type of Home: House Home Access: Stairs to  enter   Entrance Stairs-Number of Steps: ??? Home Layout: Two level;Able to live on main level with bedroom/bathroom Home Equipment: None      Prior Function Level of Independence:  Independent               Hand Dominance        Extremity/Trunk Assessment   Upper Extremity Assessment Upper Extremity Assessment: Overall WFL for tasks assessed    Lower Extremity Assessment Lower Extremity Assessment: Generalized weakness (difficult to assess 2/2 impaired communication)    Cervical / Trunk Assessment Cervical / Trunk Assessment: Normal  Communication   Communication: Prefers language other than English;Receptive difficulties;Expressive difficulties  Cognition Arousal/Alertness: Awake/alert Behavior During Therapy: Anxious Overall Cognitive Status: Difficult to assess                                 General Comments: difficult to assess orientation and cognition fully due to communication deficits. 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 Pt seems to have some awareness of deficits, becoming restless and anxious at one point during session and repeating "Why?" and "Why me?". Pt follows simple one step commands during session fairly consistently, with improved following with the use of visual and tactile cues.      General Comments General comments (skin integrity, edema, etc.): VSS on RA, daughter, Lum Babe, present and providing interpretation services    Exercises     Assessment/Plan    PT Assessment Patient needs continued PT services  PT Problem List Decreased strength;Decreased activity tolerance;Decreased balance;Decreased mobility;Decreased cognition;Decreased knowledge of use of DME;Decreased safety awareness;Decreased knowledge of precautions       PT Treatment Interventions DME instruction;Gait training;Stair training;Functional mobility training;Therapeutic activities;Therapeutic exercise;Balance training;Neuromuscular re-education;Cognitive remediation;Patient/family education    PT Goals (Current  goals can be found in the Care Plan section)  Acute Rehab PT Goals Patient Stated Goal: To improve mobility quality and restore independence PT Goal Formulation: With family Time For Goal Achievement: 04/13/20 Potential to Achieve Goals: Good    Frequency Min 4X/week   Barriers to discharge        Co-evaluation               AM-PAC PT "6 Clicks" Mobility  Outcome Measure Help needed turning from your back to your side while in a flat bed without using bedrails?: None Help needed moving from lying on your back to sitting on the side of a flat bed without using bedrails?: None Help needed moving to and from a bed to a chair (including a wheelchair)?: A Little Help needed standing up from a chair using your arms (e.g., wheelchair or bedside chair)?: None Help needed to walk in hospital room?: A Little Help needed climbing 3-5 steps with a railing? : A Lot 6 Click Score: 20    End of Session   Activity Tolerance: Patient tolerated treatment well Patient left: in bed;with call bell/phone within reach;with family/visitor present Nurse Communication: Mobility status PT Visit Diagnosis: Unsteadiness on feet (R26.81);Other abnormalities of gait and mobility (R26.89);Other symptoms and signs involving the nervous system (R29.898)    Time: 7408-1448 PT Time Calculation (min) (ACUTE ONLY): 30 min   Charges:   PT Evaluation $PT Eval Moderate Complexity: 1 Mod PT Treatments $Gait Training: 8-22 mins  Zenaida Niece, PT, DPT Acute Rehabilitation Pager: [phone]   Zenaida Niece 03/30/2020, 2:04 PM

## 2020-03-30 NOTE — Progress Notes (Signed)
Carotid duplex bilateral study completed.   Please see CV Proc for preliminary results.   Darlin Coco

## 2020-03-30 NOTE — Progress Notes (Signed)
Inpatient Rehab Admissions Coordinator Note:   Per PT recommendations, pt was screened for CIR candidacy by Gayland Curry, MS, CCC-SLP.  At this time we are recommending an Inpatient Rehab consult. AC will place consult order per protocol.   Please contact me with questions.    Gayland Curry, [place], [place] Admissions Coordinator [phone] 03/30/20 3:45 PM

## 2020-03-30 NOTE — Progress Notes (Signed)
  Echocardiogram 2D Echocardiogram has been performed.  Johny Chess 03/30/2020, 2:50 PM

## 2020-03-30 NOTE — Procedures (Signed)
Difficult to find translator for patient, please contact echo lab when a transporter is available.  Patient must be able to cooperate for echo.

## 2020-03-30 NOTE — Evaluation (Signed)
Speech Language Pathology Evaluation Patient Details Name: Sonya George MRN: [id number] DOB: 31-Oct-1955 Today's Date: 03/30/2020 Time: 7793-9030 SLP Time Calculation (min) (ACUTE ONLY): 49 min  Problem List:  Patient Active Problem List   Diagnosis Date Noted  . CVA (cerebral vascular accident) ([place]) 03/29/2020  . Diabetes ([place]) 01/29/2019  . Essential hypertension 01/29/2019   Past Medical History:  Past Medical History:  Diagnosis Date  . Diabetes mellitus without complication ([place])   . Hypertension   . Stroke [facility])    Past Surgical History: History reviewed. No pertinent surgical history. HPI:  64 y.o. female originally from [place] who speaks [place] and has a medical history significant for CVA, HTN, and DM presenting with aphasia. MRI: Acute/subacute infarct left parietal lobe with extensive edema in the cortex. No associated hemorrhage. Chronic hemorrhagic infarct left frontal lobe. Chronic infarcts in the occipital lobe bilaterally left greater than right. Chronic infarct left cerebellum.  According to her dtr, Sonya George, her last stroke occurred while she was still living in [place].  She moved to the [place] two years ago.  She recovered 80% of her communication abilities, was able to complete ADLs independently, and helped Sonya George with housekeeping.    Assessment / Plan / Recommendation Clinical Impression   Pt's daughter, present for evaluation and assisted with interpretation during co-eval with OT. Pt demonstrates a mixed receptive/expressive aphasia. She does speak [place], but comprehension/expression of both her primary and secondary language have been impacted by this new stroke.  Comprehension is c/b difficulty following auditory commands with no contextual information; understanding is improved in predictable situations.  Demonstration of understanding (pointing to or demonstrating use of objects) is impeded by apraxic errors. Yes/no reliability - 1/4 biographical questions  answered correctly.  Pt had difficulty discriminating among three familiar objects, but it was difficult to assess impact of vision and tendency to perseverate on her comprehension. Her daughter reported frequent requests for information from her mother - "what are you asking me?"  Expression was marked by some dysfluencies, repetitions; unable to repeat.  She had difficulty with tasks of confrontational naming or stating her name aloud, demonstrating frustration at her inability to express herself, asking "why?" repeatedly and ultimately crying.  Provided support and encouragement.    Responds well to visual cues and gestures.  Video interpreter for Twi is available.    Sonya George has support available that would allow her to drive her mother to OP therapies.  Strongly recommend OP SLP to address expressive/receptive aphasia.  SLP will follow here until D/C.          SLP Assessment  SLP Recommendation/Assessment: Patient needs continued Speech Lanaguage Pathology Services SLP Visit Diagnosis: Cognitive communication deficit (R41.841)    Follow Up Recommendations  Outpatient SLP    Frequency and Duration min 2x/week  1 week      SLP Evaluation Cognition  Overall Cognitive Status: Impaired/Different from baseline Arousal/Alertness: Awake/alert Orientation Level: Oriented to person Attention: Sustained Sustained Attention: Appears intact Problem Solving: Impaired       Comprehension  Auditory Comprehension Overall Auditory Comprehension: Impaired Yes/No Questions: Impaired Basic Immediate Environment Questions: 50-74% accurate Commands: Impaired One Step Basic Commands: 50-74% accurate Conversation: Simple Interfering Components: Attention;Visual impairments;Processing speed EffectiveTechniques: Extra processing time Visual Recognition/Discrimination Discrimination: Exceptions to [facility] Common Objects: Able in field of 3 Reading Comprehension Reading Status:  (able to read some  English PTA)    Expression Expression Primary Mode of Expression: Verbal Verbal Expression Overall Verbal Expression: Impaired Initiation: No impairment Automatic Speech:  (  difficult) Level of Generative/Spontaneous Verbalization: Sentence Repetition: Impaired Level of Impairment: Word level Naming: Impairment Responsive: Not tested Confrontation: Impaired Common Objects: Able in field of 3 Written Expression Dominant Hand: Right Written Expression: Unable to assess (comment)   Oral / Motor  Oral Motor/Sensory Function Overall Oral Motor/Sensory Function: Within functional limits Motor Speech Overall Motor Speech: Appears within functional limits for tasks assessed   GO                    Sonya George 03/30/2020, 3:54 PM Sonya George L. Tivis Ringer, [place] Office number [phone] Pager [phone]

## 2020-03-30 NOTE — Progress Notes (Signed)
OT Cancellation Note  Patient Details Name: Sonya George MRN: [id number] DOB: [birth year]   Cancelled Treatment:    Reason Eval/Treat Not Completed: Patient at procedure or test/ unavailable (ECHO)  [place], OT/L   Acute OT Clinical Specialist Acute Rehabilitation Services Pager [phone] Office [phone]  03/30/2020, 2:29 PM

## 2020-03-30 NOTE — Progress Notes (Signed)
STROKE TEAM PROGRESS NOTE   HISTORY OF PRESENT ILLNESS (per record) Sonya George is an 64 y.o. female non-English speaking (from [place], speaks [place]), with a history of DM, HTN and stroke 5-7 years ago who presented with a 2 day history of aphasia. On arrival she appeared confused and when using her own language could use only 2-3 words. History was obtained by EDP from the patient's daughter by phone.Her daughter stated that yesterday she began acting confused and could not remember her daughter's name. She kept calling her the name of other relatives throughout the day. This progressed to the point that this morning the patient was not even able to get real words out. She has chronic body aches but today just kept pointing to her left leg but was unable to use words to describe her symptoms. No recent falls or injuries. The daughter did not notice any facial droop or unilateral weakness. The patient was able to ambulate on presentation. At time of Neurology evaluation, the patient is globally aphasic despite assistance from an RN who speaks Twi.     INTERVAL HISTORY Her nurse is at bedside, no events overnight. She passed swallow and has had breakfast delivered but did not eat it. Expressive and receptive aphasia.    OBJECTIVE Vitals:   03/30/20 0042 03/30/20 0433 03/30/20 0811 03/30/20 1228  BP: 123/74 (!) 141/82 138/73 137/75  Pulse: 79 77 78 78  Resp: 18 18 18 18   Temp: 97.9 F (36.6 C) 97.7 F (36.5 C) 98.8 F (37.1 C) 99 F (37.2 C)  TempSrc: Oral Oral Oral Oral  SpO2: 98% 98% 95% 100%    CBC:  Recent Labs  Lab 03/29/20 1128  WBC 9.1  NEUTROABS 6.1  HGB 13.1  HCT 40.0  MCV 90.9  PLT 176    Basic Metabolic Panel:  Recent Labs  Lab 03/29/20 1128  NA 139  K 4.3  CL 105  CO2 25  GLUCOSE 138*  BUN 9  CREATININE 0.67  CALCIUM 9.6    Lipid Panel:     Component Value Date/Time   CHOL 149 03/30/2020 0531   TRIG 82 03/30/2020 0531   HDL 38 (L) 03/30/2020  0531   CHOLHDL 3.9 03/30/2020 0531   VLDL 16 03/30/2020 0531   LDLCALC 95 03/30/2020 0531   HgbA1c:  Lab Results  Component Value Date   HGBA1C 6.8 (A) 12/14/2019   Urine Drug Screen:     Component Value Date/Time   LABOPIA NONE DETECTED 03/29/2020 1643   COCAINSCRNUR NONE DETECTED 03/29/2020 1643   LABBENZ NONE DETECTED 03/29/2020 1643   AMPHETMU NONE DETECTED 03/29/2020 1643   THCU NONE DETECTED 03/29/2020 1643   LABBARB NONE DETECTED 03/29/2020 1643    Alcohol Level No results found for: [facility]  IMAGING  DG Chest 1 View 03/29/2020 IMPRESSION:  1. No evidence of implanted medical device or radiopaque foreign body to preclude MRI imaging.  2. Mild cardiomegaly and bibasilar atelectasis.  DG Pelvis 1-2 Views 03/29/2020 IMPRESSION:  No radiopaque foreign body or implanted medical device in the pelvis to preclude MRI imaging.  DG Abd 1 View 03/29/2020 IMPRESSION:  1. No radiopaque foreign bodies or evidence of implanted medical devices to preclude MRI imaging.  2. Nonobstructive bowel gas pattern.  CT HEAD WO CONTRAST 03/29/2020 IMPRESSION:  1. Multiple bilateral infarcts. The largest infarct involves the left frontal and parietal lobes and appears subacute consistent with history of 2 days of symptoms. Multiple infarcts have associated encephalomalacia suggesting prior remote  infarcts in many locations.  2. No other acute intracranial abnormalities are identified.   MR BRAIN WO CONTRAST MR ANGIO HEAD WO CONTRAST 03/29/2020 IMPRESSION:  1. Acute/subacute infarct left parietal lobe with extensive edema in the cortex. No associated hemorrhage  2. Chronic hemorrhagic infarct left frontal lobe. Chronic infarcts in the occipital lobe bilaterally left greater than right. Chronic infarct left cerebellum.  3. Right frontal para falcine calcified mass compatible with meningioma.  4. Negative MRA head   Transthoracic Echocardiogram  00/00/2021 Pending  Bilateral Carotid  Dopplers  00/00/2021 Pending  ECG - SR rate 96 BPM. (See cardiology reading for complete details)  PHYSICAL EXAM Blood pressure 137/75, pulse 78, temperature 99 F (37.2 C), temperature source Oral, resp. rate 18, SpO2 100 %.  Receptive and expressive aphasia. Sitting on the side of the bed. Not following verbal commands. She has been tryint to get up to the side of the bed and setting off the alarm. Making unrecognizable sounds and grunts. Will follow some visual commands such as when I hold my arms up she will too. Awake and alert. Smiles pleasantly. Perrl. EOMI. Smile symmetric. Tracks examiner across the room. Moving all limbs spontaneously but less the left arm. No ataxia noted. Can stand independentally and set off the alarm. Blinks to threat bilaterally. Drift left cannot assess FTN. Wide bassed gait.    ASSESSMENT/PLAN [name] is a 64 y.o. female with history of DM, HTN, chronic body aches, and stroke 5-7 years ago who presented with a 2 day history of aphasia, confusion, and kept pointing to her left leg. She did not receive IV t-PA due to late presentation (>4.5 hours from time of onset).   Stroke: multiple bilateral infarcts - embolic - source unknown.  Code Stroke CT Head - not ordered     CT head - Multiple bilateral infarcts. The largest infarct involves the left frontal and parietal lobes and appears subacute consistent with history of 2 days of symptoms. Multiple infarcts have associated encephalomalacia suggesting prior remote infarcts in many locations.  MRI head - Acute/subacute infarct left parietal lobe with extensive edema in the cortex. No associated hemorrhage. Chronic hemorrhagic infarct left frontal lobe. Chronic infarcts in the occipital lobe bilaterally left greater than right. Chronic infarct left cerebellum.   MRA head - negative  CTA H&N - not ordered  CT Perfusion - not ordered  Carotid Doppler - pending  2D Echo - pending  Will need TEE  and possibly loop  [employer] Virus 2 - negative  LDL - 94  HgbA1c - 6.8  UDS - negative  VTE prophylaxis - Lovenox Diet  Diet Order            Diet heart healthy/carb modified Room service appropriate? Yes; Fluid consistency: Thin  Diet effective ____                 No antithrombotic prior to admission, now on aspirin 325 mg daily  Patient will be counseled to be compliant with her antithrombotic medications  Ongoing aggressive stroke risk factor management  Therapy recommendations:  pending  Disposition:  Pending  Hypertension  Home BP meds: amlodipine ; Zestril  Current BP meds: none   Stable . Permissive hypertension (OK if < 220/120) but gradually normalize in 5-7 days  . Long-term BP goal normotensive  Hyperlipidemia  Home Lipid lowering medication: none   LDL 94, goal < 70  Current lipid lowering medication: Lipitor 40 mg daily   Continue statin  at discharge  Diabetes  Home diabetic meds: metformin  Current diabetic meds: SSI   HgbA1c 6.8, goal < 7.0 Recent Labs    03/29/20 2129 03/30/20 0614 03/30/20 1124  GLUCAP 162* 123* 116*    Other Stroke Risk Factors  Advanced age  Obesity, There is no height or weight on file to calculate BMI., recommend weight loss, diet and exercise as appropriate   Family hx stroke (sister and brother)   Hx stroke/TIA  Other Active Problems  Code status - Full code   Discussed with Dr Jaynee Eagles - will plan for TEE and loop Monday. NPO order written. Message sent to cardiology.   Hospital day # 1  Personally examined patient and images, and have participated in and made any corrections needed to history, physical, neuro exam,assessment and plan as stated above.  I have personally obtained the history, evaluated lab date, reviewed imaging studies and agree with radiology interpretations.    Sarina Ill, MD Stroke Neurology  I spent 35 minutes of face-to-face and non-face-to-face time with  patient. This included prechart review, lab review, study review, order entry, electronic health record documentation, patient education on the different diagnostic and therapeutic options, counseling and coordination of care, risks and benefits of management, compliance, or risk factor reduction   To contact Stroke Continuity provider, please refer to [email or web address] After hours, contact General Neurology

## 2020-03-30 NOTE — Progress Notes (Addendum)
Progress Note    Kassady Laboy  YNW:[id number] DOB: 1956/06/16  DOA: 03/29/2020 PCP: Maximiano Coss, NP    Brief Narrative:    Medical records reviewed and are as summarized below:  Sonya George is an 64 y.o. female with medical history significant of CVA; HTN; and DM presenting with aphasia.  She started acting not like herself, forgot her daughter's name and called her other family member's names.  She was also having trouble communicating with her daughter.  Her last stroke in [place] also affected her speech.  She also hasn't been eating and has been acting abnormal and can't communicate.  She has been acting this way for a couple of days but it got worse last night.   Assessment/Plan:   Principal Problem:   CVA (cerebral vascular accident) ([place]) Active Problems:   Diabetes ([place])   Essential hypertension   CVA -Patient with reported prior h/o CVA presenting with aphasia and confusion for several days but worse last night -MRI: Acute/subacute infarct left parietal lobe with extensive edema in the cortex. No associated hemorrhage Chronic hemorrhagic infarct left frontal lobe. Chronic infarcts in the occipital lobe bilaterally left greater than right. Chronic infarct left cerebellum. Right frontal para falcine calcified mass compatible with meningioma. -Carotid dopplers -Echo pending -needs TEE/loop on Monday -ASA daily (has not been taking regularly so this does not appear to be ASA failure) -Neurology consult appreciated -tele shows NSR up to this point, continue to monitor -PT/OT/ST/Nutrition Consults: ? CIR  HTN -Allow permissive HTNfor now -Treat BP only if >220/120, and then with goal of 15% reduction -HoldNorvasc, Lisinopriland plan to restart in 48-72 hours  HLD -LDL 95, goal <70 -Start Lipitor 40 mg daily  DM -A1c in 2020 showed 5.6-- > 2021 6.8 -Hold home PO Glucophage -Will order moderate-scale SSI   Family Communication/Anticipated D/C  date and plan/Code Status   DVT prophylaxis: Lovenox ordered. Code Status: Full Code.  Family Communication: daughter at bedside Disposition Plan: Status is: Inpatient  Remains inpatient appropriate because:Inpatient level of care appropriate due to severity of illness   Dispo: The patient is from: Home              Anticipated d/c is to: tbd              Anticipated d/c date is: 2 days              Patient currently is not medically stable to d/c.         Medical Consultants:    Neurology  Cardiology (TEE)     Subjective:   Getting an echo  Objective:    Vitals:   03/30/20 0042 03/30/20 0433 03/30/20 0811 03/30/20 1228  BP: 123/74 (!) 141/82 138/73 137/75  Pulse: 79 77 78 78  Resp: 18 18 18 18   Temp: 97.9 F (36.6 C) 97.7 F (36.5 C) 98.8 F (37.1 C) 99 F (37.2 C)  TempSrc: Oral Oral Oral Oral  SpO2: 98% 98% 95% 100%   No intake or output data in the 24 hours ending 03/30/20 1242 There were no vitals filed for this visit.  Exam: Receptive and expressive aphasia NAD Moves well but can be difficult to re-direct  Data Reviewed:   I have personally reviewed following labs and imaging studies:  Labs: Labs show the following:   Basic Metabolic Panel: Recent Labs  Lab 03/29/20 1128  NA 139  K 4.3  CL 105  CO2 25  GLUCOSE 138*  BUN  9  CREATININE 0.67  CALCIUM 9.6   GFR CrCl cannot be calculated (Unknown ideal weight.). Liver Function Tests: Recent Labs  Lab 03/29/20 1128  AST 25  ALT 13  ALKPHOS 77  BILITOT 0.9  PROT 7.6  ALBUMIN 3.8   No results for input(s): LIPASE, AMYLASE in the last 168 hours. No results for input(s): AMMONIA in the last 168 hours. Coagulation profile Recent Labs  Lab 03/29/20 1128  INR 1.1    CBC: Recent Labs  Lab 03/29/20 1128  WBC 9.1  NEUTROABS 6.1  HGB 13.1  HCT 40.0  MCV 90.9  PLT 278   Cardiac Enzymes: No results for input(s): CKTOTAL, CKMB, CKMBINDEX, TROPONINI in the last 168  hours. BNP (last 3 results) No results for input(s): PROBNP in the last 8760 hours. CBG: Recent Labs  Lab 03/29/20 1826 03/29/20 2129 03/30/20 0614 03/30/20 1124  GLUCAP 120* 162* 123* 116*   D-Dimer: No results for input(s): DDIMER in the last 72 hours. Hgb A1c: No results for input(s): HGBA1C in the last 72 hours. Lipid Profile: Recent Labs    03/30/20 0531  CHOL 149  HDL 38*  LDLCALC 95  TRIG 82  CHOLHDL 3.9   Thyroid function studies: Recent Labs    03/29/20 1643  TSH 0.751   Anemia work up: No results for input(s): VITAMINB12, FOLATE, FERRITIN, TIBC, IRON, RETICCTPCT in the last 72 hours. Sepsis Labs: Recent Labs  Lab 03/29/20 1128  WBC 9.1    Microbiology Recent Results (from the past 240 hour(s))  SARS Coronavirus 2 by RT PCR (hospital order, performed in [facility] hospital lab) Nasopharyngeal Nasopharyngeal Swab     Status: None   Collection Time: 03/29/20  4:22 PM   Specimen: Nasopharyngeal Swab  Result Value Ref Range Status   SARS Coronavirus 2 NEGATIVE NEGATIVE Final    Comment: (NOTE) SARS-CoV-2 target nucleic acids are NOT DETECTED.  The SARS-CoV-2 RNA is generally detectable in upper and lower respiratory specimens during the acute phase of infection. The lowest concentration of SARS-CoV-2 viral copies this assay can detect is 250 copies / mL. A negative result does not preclude SARS-CoV-2 infection and should not be used as the sole basis for treatment or other patient management decisions.  A negative result may occur with improper specimen collection / handling, submission of specimen other than nasopharyngeal swab, presence of viral mutation(s) within the areas targeted by this assay, and inadequate number of viral copies (<250 copies / mL). A negative result must be combined with clinical observations, patient history, and epidemiological information.  Fact Sheet for Patients:   StrictlyIdeas.no  Fact  Sheet for Healthcare Providers: BankingDealers.co.za  This test is not yet approved or  cleared by the [place] FDA and has been authorized for detection and/or diagnosis of SARS-CoV-2 by FDA under an Emergency Use Authorization (EUA).  This EUA will remain in effect (meaning this test can be used) for the duration of the COVID-19 declaration under Section 564(b)(1) of the Act, 21 U.S.C. section 360bbb-3(b)(1), unless the authorization is terminated or revoked sooner.  Performed at [facility], [place] [address], [place], [place] [address]     Procedures and diagnostic studies:  DG Chest 1 View  Result Date: 03/29/2020 CLINICAL DATA:  Pre MRI screening. EXAM: CHEST  1 VIEW COMPARISON:  None. FINDINGS: No evidence of implanted medical device or radiopaque foreign body to preclude MRI imaging. Linear density projecting over the thoracic inlet is presumably related to patient's mask. Mild  cardiomegaly. Tortuous thoracic aorta. Subsegmental atelectasis in the lung bases. Pulmonary vasculature is normal. No consolidation, pleural effusion, or pneumothorax. No acute osseous abnormalities are seen. IMPRESSION: 1. No evidence of implanted medical device or radiopaque foreign body to preclude MRI imaging. 2. Mild cardiomegaly and bibasilar atelectasis. Electronically Signed   By: Keith Rake M.D.   On: 03/29/2020 17:15   DG Pelvis 1-2 Views  Result Date: 03/29/2020 CLINICAL DATA:  Pre MRI screening. EXAM: PELVIS - 1-2 VIEW COMPARISON:  None. FINDINGS: No visualized radiopaque foreign body or implanted medical device to preclude MRI imaging. The cortical margins of the bony pelvis are intact. No fracture. Pubic symphysis and sacroiliac joints are congruent. Both femoral heads are well-seated in the respective acetabula. Pelvic bowel gas pattern is unremarkable. IMPRESSION: No radiopaque foreign body or implanted medical device in the pelvis to preclude MRI imaging.  Electronically Signed   By: Keith Rake M.D.   On: 03/29/2020 17:16   DG Abd 1 View  Result Date: 03/29/2020 CLINICAL DATA:  Pre MRI screening. EXAM: ABDOMEN - 1 VIEW COMPARISON:  None. FINDINGS: There are no radiopaque foreign bodies or evidence of implanted medical devices to preclude MRI imaging. Nonobstructive bowel gas pattern. Suggestion of sigmoid colonic redundancy with mild gaseous distension. No small bowel dilatation or obstruction. Single right upper quadrant calcification is nonspecific. No acute osseous abnormalities are seen. IMPRESSION: 1. No radiopaque foreign bodies or evidence of implanted medical devices to preclude MRI imaging. 2. Nonobstructive bowel gas pattern. Electronically Signed   By: Keith Rake M.D.   On: 03/29/2020 17:18   CT HEAD WO CONTRAST  Result Date: 03/29/2020 CLINICAL DATA:  Neuro deficit. Acute stroke suspected. Aphasia. The patient's symptoms have been present for 2 days. EXAM: CT HEAD WITHOUT CONTRAST TECHNIQUE: Contiguous axial images were obtained from the base of the skull through the vertex without intravenous contrast. COMPARISON:  None. FINDINGS: Brain: No subdural, epidural, and subarachnoid hemorrhage. The patient has multiple infarcts. There appears to be a large subacute infarct in the left MCA territory involving the frontal and parietal lobes. An area of encephalomalacia in the left frontal lobe probably represents sequela of previous infarct. Encephalomalacia in the left occipital lobe likely represents a previous remote infarct. A small infarct in the right occipital lobe also has associated cephalo malacia suggesting it is nonacute. An age indeterminate infarct is seen in the right posterior parietal lobe on series 3, image 14. Encephalomalacia in the posterior right frontal lobe on series 3, image 24 is consistent with another site of previous infarct. There is an infarct in the posterior left cerebellar hemisphere. The cerebellum,  brainstem, and basal cisterns are otherwise normal. Small lacunar infarct in the right thalamus. Small lacunar infarct in the left basal ganglia on axial image 12. Ventricles are unremarkable. Sulci are unremarkable. No midline shift. Vascular: No hyperdense vessel or unexpected calcification. Skull: Normal. Negative for fracture or focal lesion. Sinuses/Orbits: No acute finding. Other: None. IMPRESSION: 1. Multiple bilateral infarcts. The largest infarct involves the left frontal and parietal lobes and appears subacute consistent with history of 2 days of symptoms. Multiple infarcts have associated encephalomalacia suggesting prior remote infarcts in many locations. 2. No other acute intracranial abnormalities are identified. Electronically Signed   By: Dorise Bullion III M.D   On: 03/29/2020 12:52   MR ANGIO HEAD WO CONTRAST  Result Date: 03/29/2020 CLINICAL DATA:  Stroke EXAM: MRI HEAD WITHOUT CONTRAST MRA HEAD WITHOUT CONTRAST TECHNIQUE: Multiplanar, multiecho pulse sequences of the  brain and surrounding structures were obtained without intravenous contrast. Angiographic images of the head were obtained using MRA technique without contrast. COMPARISON:  CT head 03/29/2020 FINDINGS: MRI HEAD FINDINGS Brain: Acute/subacute infarct in the left parietal lobe which shows restricted diffusion and extensive edema on T2 and FLAIR. Additional possible small areas of acute infarct in the right frontal white matter and in the right parietal cortex although these could be related to chronic infarct. Chronic infarct in the left frontal lobe, left occipital lobe. Small chronic infarct right occipital lobe. Chronic infarct left cerebellum. Mild atrophy. Ventricular size normal. Chronic hemorrhage in the left frontal infarct. Densely calcified mass to the right of the frontal falx most likely meningioma measuring 10 x 17 mm. No edema in the adjacent brain. Vascular: Normal arterial flow voids. Skull and upper cervical  spine: No focal skeletal lesion. Sinuses/Orbits: Mild mucosal edema paranasal sinuses. Negative orbit Other: None MRA HEAD FINDINGS Both vertebral arteries patent to the basilar. Left PICA patent. Right PICA very small. Basilar widely patent. Superior cerebellar and posterior cerebral arteries patent bilaterally without stenosis. Small posterior communicating arteries patent bilaterally. Internal carotid artery widely patent bilaterally. Right anterior and middle cerebral arteries patent without stenosis. Left anterior cerebral artery patent. Right middle cerebral artery patent bilaterally without stenosis. There is decreased signal in the inferior division left MCA due to tortuosity. Negative for aneurysm. IMPRESSION: 1. Acute/subacute infarct left parietal lobe with extensive edema in the cortex. No associated hemorrhage 2. Chronic hemorrhagic infarct left frontal lobe. Chronic infarcts in the occipital lobe bilaterally left greater than right. Chronic infarct left cerebellum. 3. Right frontal para falcine calcified mass compatible with meningioma. 4. Negative MRA head Electronically Signed   By: Franchot Gallo M.D.   On: 03/29/2020 18:59   MR BRAIN WO CONTRAST  Result Date: 03/29/2020 CLINICAL DATA:  Stroke EXAM: MRI HEAD WITHOUT CONTRAST MRA HEAD WITHOUT CONTRAST TECHNIQUE: Multiplanar, multiecho pulse sequences of the brain and surrounding structures were obtained without intravenous contrast. Angiographic images of the head were obtained using MRA technique without contrast. COMPARISON:  CT head 03/29/2020 FINDINGS: MRI HEAD FINDINGS Brain: Acute/subacute infarct in the left parietal lobe which shows restricted diffusion and extensive edema on T2 and FLAIR. Additional possible small areas of acute infarct in the right frontal white matter and in the right parietal cortex although these could be related to chronic infarct. Chronic infarct in the left frontal lobe, left occipital lobe. Small chronic infarct  right occipital lobe. Chronic infarct left cerebellum. Mild atrophy. Ventricular size normal. Chronic hemorrhage in the left frontal infarct. Densely calcified mass to the right of the frontal falx most likely meningioma measuring 10 x 17 mm. No edema in the adjacent brain. Vascular: Normal arterial flow voids. Skull and upper cervical spine: No focal skeletal lesion. Sinuses/Orbits: Mild mucosal edema paranasal sinuses. Negative orbit Other: None MRA HEAD FINDINGS Both vertebral arteries patent to the basilar. Left PICA patent. Right PICA very small. Basilar widely patent. Superior cerebellar and posterior cerebral arteries patent bilaterally without stenosis. Small posterior communicating arteries patent bilaterally. Internal carotid artery widely patent bilaterally. Right anterior and middle cerebral arteries patent without stenosis. Left anterior cerebral artery patent. Right middle cerebral artery patent bilaterally without stenosis. There is decreased signal in the inferior division left MCA due to tortuosity. Negative for aneurysm. IMPRESSION: 1. Acute/subacute infarct left parietal lobe with extensive edema in the cortex. No associated hemorrhage 2. Chronic hemorrhagic infarct left frontal lobe. Chronic infarcts in the occipital lobe bilaterally  left greater than right. Chronic infarct left cerebellum. 3. Right frontal para falcine calcified mass compatible with meningioma. 4. Negative MRA head Electronically Signed   By: Franchot Gallo M.D.   On: 03/29/2020 18:59    Medications:   . aspirin  300 mg Rectal Daily   Or  . aspirin  325 mg Oral Daily  . atorvastatin  40 mg Oral Daily  . enoxaparin (LOVENOX) injection  40 mg Subcutaneous Q24H  . insulin aspart  0-15 Units Subcutaneous TID WC  . insulin aspart  0-5 Units Subcutaneous QHS   Continuous Infusions: . sodium chloride 50 mL/hr at 03/29/20 1649     LOS: 1 day   Geradine Girt  [facility]   How to contact the [facility] Attending  or Consulting provider [place] or covering provider during after hours [place], for this patient?  1. Check the care team in [facility] and look for a) attending/consulting TR[facility] provider listed and b) the [facility] team listed 2. Log into [email or web address] and use [place]'s universal password to access. If you do not have the password, please contact the hospital operator. 3. Locate the [facility] provider you are looking for under [facility] and page to a number that you can be directly reached. 4. If you still have difficulty reaching the provider, please page the [facility] (Director on Call) for the Hospitalists listed on amion for assistance.  03/30/2020, 12:42 PM

## 2020-03-31 LAB — GLUCOSE, CAPILLARY
Glucose-Capillary: 137 mg/dL — ABNORMAL HIGH (ref 70–99)
Glucose-Capillary: 103 mg/dL — ABNORMAL HIGH (ref 70–99)
Glucose-Capillary: 222 mg/dL — ABNORMAL HIGH (ref 70–99)
Glucose-Capillary: 117 mg/dL — ABNORMAL HIGH (ref 70–99)
Glucose-Capillary: 138 mg/dL — ABNORMAL HIGH (ref 70–99)

## 2020-03-31 MED ORDER — ENSURE ENLIVE PO LIQD
237.0000 mL | Freq: Every day | ORAL | Status: DC
  Administered 2020-03-31: 237 mL via ORAL

## 2020-03-31 NOTE — TOC Initial Note (Signed)
Transition of Care [facility]) - Initial/Assessment Note    Patient Details  Name: Sonya George MRN: [id number] Date of Birth: 03-12-1956  Transition of [facility]) CM/SW Contact:    Marilu Favre, RN Phone Number: 03/31/2020, 3:37 PM  Clinical Narrative:                  Spoke to patient's daughter Sonya George . Confirmed face sheet information. PCP is Dr Orland Mustard  at [place].  Patient lives with Sonya George. Discussed OP PT,OT,and SP [facility] . Confirmed patient does not have insurance, discussed [place] program and [place]. [place] voiced understanding and patient can afford $3 co pay per prescription   Expected Discharge Plan: Home/Self Care Barriers to Discharge: Continued Medical Work up   Patient Goals and CMS Choice Patient states their goals for this hospitalization and ongoing recovery are:: to return to home CMS Medicare.gov Compare Post Acute Care list provided to:: Patient Choice offered to / list presented to : Patient, Adult Children (OP PT,OT,SP)  Expected Discharge Plan and Services Expected Discharge Plan: Home/Self Care   Discharge Planning Services: CM Consult   Living arrangements for the past 2 months: Single Family Home                 DME Arranged: N/A         HH Arranged: NA          Prior Living Arrangements/Services Living arrangements for the past 2 months: Single Family Home Lives with:: Adult Children Patient language and need for interpreter reviewed:: Yes Do you feel safe going back to the place where you live?: Yes      Need for Family Participation in Patient Care: Yes (Comment) Care giver support system in place?: Yes (comment)   Criminal Activity/Legal Involvement Pertinent to Current Situation/Hospitalization: No - Comment as needed  Activities of Daily Living Home Assistive Devices/Equipment: Eyeglasses ADL Screening (condition at time of admission) Patient's cognitive ability adequate to safely  complete daily activities?: Yes Is the patient deaf or have difficulty hearing?: No Does the patient have difficulty seeing, even when wearing glasses/contacts?: No Does the patient have difficulty concentrating, remembering, or making decisions?: No Patient able to express need for assistance with ADLs?: Yes Does the patient have difficulty dressing or bathing?: No Independently performs ADLs?: Yes (appropriate for developmental age) Does the patient have difficulty walking or climbing stairs?: No Weakness of Legs: Both Weakness of Arms/Hands: None  Permission Sought/Granted   Permission granted to share information with : Yes, Verbal Permission Granted  Share Information with NAME: daughter Sonya George           Emotional Assessment Appearance:: Appears stated age       Alcohol / Substance Use: Not Applicable Psych Involvement: No (comment)  Admission diagnosis:  CVA (cerebral vascular accident) ([place]) [I63.9] Encounter for imaging to screen for metal prior to magnetic resonance imaging (MRI) [Z13.89] Acute CVA (cerebrovascular accident) [facility]) [I63.9] Patient Active Problem List   Diagnosis Date Noted  . CVA (cerebral vascular accident) ([place]) 03/29/2020  . Diabetes ([place]) 01/29/2019  . Essential hypertension 01/29/2019   PCP:  Maximiano Coss, NP Pharmacy:   [place], [place] [address] Phone: [phone] Fax: [place], [place] - [address] [place] [address] Phone: [phone] Fax: [phone]     Social Determinants of  Health (SDOH) Interventions    Readmission Risk Interventions No flowsheet data found.

## 2020-03-31 NOTE — Progress Notes (Signed)
Initial Nutrition Assessment  DOCUMENTATION CODES:   Not applicable  INTERVENTION:  Provide Ensure Enlive po once daily, each supplement provides 350 kcal and 20 grams of protein  Encourage adequate PO intake.   Recommend obtaining new weight and height measurement.   NUTRITION DIAGNOSIS:   Increased nutrient needs related to acute illness as evidenced by estimated needs.  GOAL:   Patient will meet greater than or equal to 90% of their needs  MONITOR:   PO intake, Supplement acceptance, Skin, Weight trends, Labs, I & O's  REASON FOR ASSESSMENT:   Consult  (stroke)  ASSESSMENT:   64 y.o. female with medical history significant of CVA; HTN; and DM presenting with aphasia. Pt with CVA.  Meal completion 75% at lunch today. Pt reports no abdominal discomfort during time of visit. Per MD note, pt with poor po 2 days prior to admission. Noted no new weight and height recorded, recommend obtaining new measurements. RD to order nutritional supplements to aid in caloric and protein needs. Plans for TEE/loop tomorrow.   NUTRITION - FOCUSED PHYSICAL EXAM:    Most Recent Value  Orbital Region No depletion  Upper Arm Region No depletion  Thoracic and Lumbar Region No depletion  Buccal Region No depletion  Temple Region No depletion  Clavicle Bone Region No depletion  Clavicle and Acromion Bone Region No depletion  Scapular Bone Region No depletion  Dorsal Hand No depletion  Patellar Region No depletion  Anterior Thigh Region No depletion  Posterior Calf Region No depletion  Edema (RD Assessment) None  Hair Reviewed  Eyes Reviewed  Mouth Reviewed  Skin Reviewed  Nails Reviewed     Labs and medications reviewed.   Diet Order:   Diet Order            Diet NPO time specified  Diet effective midnight           Diet heart healthy/carb modified Room service appropriate? Yes; Fluid consistency: Thin  Diet effective ____                 EDUCATION NEEDS:   Not  appropriate for education at this time  Skin:  Skin Assessment: Reviewed RN Assessment  Last BM:  Unknown  Height:   Ht Readings from Last 1 Encounters:  No data found for Ht    Weight:   Wt Readings from Last 1 Encounters:  12/14/19 77.5 kg    BMI:  There is no height or weight on file to calculate BMI.  Estimated Nutritional Needs:   Kcal:  1660-6301  Protein:  85-95 grams  Fluid:  >/= 1.8 L/day   Corrin Parker, MS, RD, LDN RD pager number/after hours weekend pager number on Amion.

## 2020-03-31 NOTE — Progress Notes (Signed)
  Speech Language Pathology Treatment: Cognitive-Linquistic  Patient Details Name: Sonya George MRN: [id number] DOB: 08-24-1955 Today's Date: 03/31/2020 Time: 3295-1884 SLP Time Calculation (min) (ACUTE ONLY): 23 min  Assessment / Plan / Recommendation Clinical Impression  Pt was seen for skilled ST targeting cognitive communication deficits.  Pt was encountered awake/alert and was agreeable to this session.  [employer] interpreter [phone]) was used for the entirety of this session.  Pt completed confrontational naming tasks targeting expressive language, and she answered yes/no questions targeting receptive language on this date.  She was 0% accurate with confrontational naming tasks given moderate tactile, visual, semantic, and phonemic cues, and she required max cues to complete this task.  She answered basic biographical yes/no questions with 75% accuracy independently, and she answered additional yes/no questions regarding orientation (time of day, place, etc.) with 50% accuracy independently.  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  Continue to recommend outpatient speech therapy at time of discharge.  SLP will f/u for diagnostic treatment acutely per POC.   HPI HPI: 64 y.o. female originally from [place] who speaks [place] and has a medical history significant for CVA, HTN, and DM presenting with aphasia. MRI: Acute/subacute infarct left parietal lobe with extensive edema in the cortex. No associated hemorrhage. Chronic hemorrhagic infarct left frontal lobe. Chronic infarcts in the occipital lobe bilaterally left greater than right. Chronic infarct left cerebellum.  According to her dtr, Sonya George, her last stroke occurred while she was still living in [place].  She moved to the [place] two years ago.  She recovered 80% of her communication abilities, was able to complete ADLs  independently, and helped Sonya George with housekeeping.       SLP Plan  Continue with current plan of care       Recommendations                Follow up Recommendations: Outpatient SLP SLP Visit Diagnosis: Cognitive communication deficit (K16.010) Plan: Continue with current plan of care       [place]., M.S., [place] [facility] Office: [phone]  [place] 03/31/2020, 9:12 AM

## 2020-03-31 NOTE — Progress Notes (Signed)
Patient up at sink brushing her teeth with CNA. No family present but records indicate that family would like outpatient/HH therapy. Daughter still not in room on follow up but note that ST reports verbal output improving and outpatient therapy now recommended. Discussed with daughter who plans to provide supervision after discharge. Will defer consult for now.

## 2020-03-31 NOTE — Progress Notes (Signed)
STROKE TEAM PROGRESS NOTE   INTERVAL HISTORY Patient is sitting up in bed.  No family at the bedside.  She is globally aphasic and not following commands.  She is able to move all 4 extremities well against gravity.  Vital signs are stable.  OBJECTIVE Vitals:   03/30/20 1707 03/30/20 2129 03/31/20 0114 03/31/20 0544  BP: 138/69 (!) 141/73 126/77 130/81  Pulse: 76 89 81 82  Resp: 18 18 18 18   Temp: 98.8 F (37.1 C) 98.3 F (36.8 C) 97.9 F (36.6 C) 98 F (36.7 C)  TempSrc: Oral Oral Oral Oral  SpO2: 98% 98% 99% 96%   CBC:  Recent Labs  Lab 03/29/20 1128  WBC 9.1  NEUTROABS 6.1  HGB 13.1  HCT 40.0  MCV 90.9  PLT 619   Basic Metabolic Panel:  Recent Labs  Lab 03/29/20 1128  NA 139  K 4.3  CL 105  CO2 25  GLUCOSE 138*  BUN 9  CREATININE 0.67  CALCIUM 9.6   Lipid Panel:     Component Value Date/Time   CHOL 149 03/30/2020 0531   TRIG 82 03/30/2020 0531   HDL 38 (L) 03/30/2020 0531   CHOLHDL 3.9 03/30/2020 0531   VLDL 16 03/30/2020 0531   LDLCALC 95 03/30/2020 0531   HgbA1c:  Lab Results  Component Value Date   HGBA1C 6.8 (A) 12/14/2019   Urine Drug Screen:     Component Value Date/Time   LABOPIA NONE DETECTED 03/29/2020 1643   COCAINSCRNUR NONE DETECTED 03/29/2020 1643   LABBENZ NONE DETECTED 03/29/2020 1643   AMPHETMU NONE DETECTED 03/29/2020 1643   THCU NONE DETECTED 03/29/2020 1643   LABBARB NONE DETECTED 03/29/2020 1643    Alcohol Level No results found for: [facility]  IMAGING  DG Chest 1 View 03/29/2020 IMPRESSION:  1. No evidence of implanted medical device or radiopaque foreign body to preclude MRI imaging.  2. Mild cardiomegaly and bibasilar atelectasis.  DG Pelvis 1-2 Views 03/29/2020 IMPRESSION:  No radiopaque foreign body or implanted medical device in the pelvis to preclude MRI imaging.  DG Abd 1 View 03/29/2020 IMPRESSION:  1. No radiopaque foreign bodies or evidence of implanted medical devices to preclude MRI imaging.  2.  Nonobstructive bowel gas pattern.  CT HEAD WO CONTRAST 03/29/2020 IMPRESSION:  1. Multiple bilateral infarcts. The largest infarct involves the left frontal and parietal lobes and appears subacute consistent with history of 2 days of symptoms. Multiple infarcts have associated encephalomalacia suggesting prior remote infarcts in many locations.  2. No other acute intracranial abnormalities are identified.   MR BRAIN WO CONTRAST MR ANGIO HEAD WO CONTRAST 03/29/2020 IMPRESSION:  1. Acute/subacute infarct left parietal lobe with extensive edema in the cortex. No associated hemorrhage  2. Chronic hemorrhagic infarct left frontal lobe. Chronic infarcts in the occipital lobe bilaterally left greater than right. Chronic infarct left cerebellum.  3. Right frontal para falcine calcified mass compatible with meningioma.  4. Negative MRA head   Transthoracic Echocardiogram  Ejection fraction 60 to 65%.  No cardiac source of embolism.  Bilateral Carotid Dopplers  03/30/2020 Right Carotid: Velocities in the right ICA are consistent with a 1-39% stenosis.  Left Carotid: Velocities in the left ICA are consistent with a 1-39% stenosis.  Vertebrals: Bilateral vertebral arteries demonstrate antegrade flow.  Subclavians: Normal flow hemodynamics were seen in bilateral subclavian arteries.   ECG - SR rate 96 BPM. (See cardiology reading for complete details)   PHYSICAL EXAM    Blood pressure 130/81, pulse  82, temperature 98 F (36.7 C), temperature source Oral, resp. rate 18, SpO2 96 %. Frail African middle-aged lady not in distress. . Afebrile. Head is nontraumatic. Neck is supple without bruit.    Cardiac exam no murmur or gallop. Lungs are clear to auscultation. Distal pulses are well felt. Neurological Exam : Awake and alert.  Receptive and expressive aphasia. Sitting on the side of the bed. Not following verbal commands. . Nonverbal making unrecognizable sounds and grunts. Will follow some visual  commands   Smiles pleasantly. Perrl. EOMI. Smile symmetric. Tracks examiner across the room. Moving all limbs spontaneously but less the left arm. No ataxia noted. Can stand independentally and set off the alarm. Blinks to threat bilaterally. Drift left cannot assess FTN. Wide bassed gait.    ASSESSMENT/PLAN [name] is a 64 y.o. female with history of DM, HTN, chronic body aches, and stroke 5-7 years ago who presented with a 2 day history of aphasia, confusion, and kept pointing to her left leg. She did not receive IV t-PA due to late presentation (>4.5 hours from time of onset).   Stroke: multiple bilateral infarcts - embolic - source unknown.  CT head - Multiple bilateral infarcts. The largest infarct involves the left frontal and parietal lobes and appears subacute consistent with history of 2 days of symptoms. Multiple infarcts have associated encephalomalacia suggesting prior remote infarcts in many locations.  MRI head - Acute/subacute infarct left parietal lobe with extensive edema in the cortex. No associated hemorrhage. Chronic hemorrhagic infarct left frontal lobe. Chronic infarcts in the occipital lobe bilaterally left greater than right. Chronic infarct left cerebellum.   MRA head - negative  2D Echo -normal ejection fraction.  No cardiac source of embolism.  Carotid Doppler B ICA 1-39% stenosis, VAs antegrade   TEE scheduled for tomorrow 8/31 to look for source of infarct   Loop placement if TEE negative     [employer] Virus 2 - negative  LDL - 94  HgbA1c - 6.8  UDS - negative  VTE prophylaxis - Lovenox  No antithrombotic prior to admission, now on aspirin 325 mg daily  Therapy recommendations:  OutPt   Disposition:  Pending  Hypertension  Home BP meds: amlodipine ; Zestril  Current BP meds: none   Stable . Permissive hypertension (OK if < 220/120) but gradually normalize in 5-7 days  . Long-term BP goal normotensive  Hyperlipidemia  Home  Lipid lowering medication: none   LDL 94, goal < 70  Current lipid lowering medication: Lipitor 40 mg daily   Continue statin at discharge  Diabetes  Home diabetic meds: metformin  Current diabetic meds: SSI   HgbA1c 6.8, goal < 7.0 Recent Labs    03/30/20 1604 03/30/20 2126 03/31/20 0606  GLUCAP 128* 152* 137*    Other Stroke Risk Factors  Advanced age  Family hx stroke (sister and brother)   Hx stroke/TIA      Other Active Problems      Hospital day # 2  She has presented with aphasia due to embolic left MCA branch infarct.  Recommend check TEE for cardiac source of embolism and loop recorder insertion for paroxysmal A. fib at discharge.  Continue aspirin Plavix for 3 weeks followed by aspirin alone.  No family available at the bedside for discussion.  Discussed with Dr. Eliseo Squires.  Greater than 50% time during this 25-minute visit was spent on counseling and coordination of care about embolic stroke and discussion with care team and answering questions.  Antony Contras, MD To contact Stroke Continuity provider, please refer to [email or web address] After hours, contact General Neurology

## 2020-03-31 NOTE — Progress Notes (Signed)
Inpatient Rehab Admissions Coordinator:   PT/OT/SLP are now recommending outpatient therapy at discharge (PT note is not yet in but I have spoken with PT assigned today). Pt. Does not require CIR-level therapies so CIR will signe off at this time. Thank you for this consult.   Clemens Catholic, [place], [place] Admissions Coordinator  [phone] ([place]) [phone] (office)

## 2020-03-31 NOTE — H&P (View-Only) (Signed)
STROKE TEAM PROGRESS NOTE   INTERVAL HISTORY Patient is sitting up in bed.  No family at the bedside.  She is globally aphasic and not following commands.  She is able to move all 4 extremities well against gravity.  Vital signs are stable.  OBJECTIVE Vitals:   03/30/20 1707 03/30/20 2129 03/31/20 0114 03/31/20 0544  BP: 138/69 (!) 141/73 126/77 130/81  Pulse: 76 89 81 82  Resp: 18 18 18 18   Temp: 98.8 F (37.1 C) 98.3 F (36.8 C) 97.9 F (36.6 C) 98 F (36.7 C)  TempSrc: Oral Oral Oral Oral  SpO2: 98% 98% 99% 96%   CBC:  Recent Labs  Lab 03/29/20 1128  WBC 9.1  NEUTROABS 6.1  HGB 13.1  HCT 40.0  MCV 90.9  PLT 376   Basic Metabolic Panel:  Recent Labs  Lab 03/29/20 1128  NA 139  K 4.3  CL 105  CO2 25  GLUCOSE 138*  BUN 9  CREATININE 0.67  CALCIUM 9.6   Lipid Panel:     Component Value Date/Time   CHOL 149 03/30/2020 0531   TRIG 82 03/30/2020 0531   HDL 38 (L) 03/30/2020 0531   CHOLHDL 3.9 03/30/2020 0531   VLDL 16 03/30/2020 0531   LDLCALC 95 03/30/2020 0531   HgbA1c:  Lab Results  Component Value Date   HGBA1C 6.8 (A) 12/14/2019   Urine Drug Screen:     Component Value Date/Time   LABOPIA NONE DETECTED 03/29/2020 1643   COCAINSCRNUR NONE DETECTED 03/29/2020 1643   LABBENZ NONE DETECTED 03/29/2020 1643   AMPHETMU NONE DETECTED 03/29/2020 1643   THCU NONE DETECTED 03/29/2020 1643   LABBARB NONE DETECTED 03/29/2020 1643    Alcohol Level No results found for: [facility]  IMAGING  DG Chest 1 View 03/29/2020 IMPRESSION:  1. No evidence of implanted medical device or radiopaque foreign body to preclude MRI imaging.  2. Mild cardiomegaly and bibasilar atelectasis.  DG Pelvis 1-2 Views 03/29/2020 IMPRESSION:  No radiopaque foreign body or implanted medical device in the pelvis to preclude MRI imaging.  DG Abd 1 View 03/29/2020 IMPRESSION:  1. No radiopaque foreign bodies or evidence of implanted medical devices to preclude MRI imaging.  2.  Nonobstructive bowel gas pattern.  CT HEAD WO CONTRAST 03/29/2020 IMPRESSION:  1. Multiple bilateral infarcts. The largest infarct involves the left frontal and parietal lobes and appears subacute consistent with history of 2 days of symptoms. Multiple infarcts have associated encephalomalacia suggesting prior remote infarcts in many locations.  2. No other acute intracranial abnormalities are identified.   MR BRAIN WO CONTRAST MR ANGIO HEAD WO CONTRAST 03/29/2020 IMPRESSION:  1. Acute/subacute infarct left parietal lobe with extensive edema in the cortex. No associated hemorrhage  2. Chronic hemorrhagic infarct left frontal lobe. Chronic infarcts in the occipital lobe bilaterally left greater than right. Chronic infarct left cerebellum.  3. Right frontal para falcine calcified mass compatible with meningioma.  4. Negative MRA head   Transthoracic Echocardiogram  Ejection fraction 60 to 65%.  No cardiac source of embolism.  Bilateral Carotid Dopplers  03/30/2020 Right Carotid: Velocities in the right ICA are consistent with a 1-39% stenosis.  Left Carotid: Velocities in the left ICA are consistent with a 1-39% stenosis.  Vertebrals: Bilateral vertebral arteries demonstrate antegrade flow.  Subclavians: Normal flow hemodynamics were seen in bilateral subclavian arteries.   ECG - SR rate 96 BPM. (See cardiology reading for complete details)   PHYSICAL EXAM    Blood pressure 130/81, pulse  82, temperature 98 F (36.7 C), temperature source Oral, resp. rate 18, SpO2 96 %. Frail African middle-aged lady not in distress. . Afebrile. Head is nontraumatic. Neck is supple without bruit.    Cardiac exam no murmur or gallop. Lungs are clear to auscultation. Distal pulses are well felt. Neurological Exam : Awake and alert.  Receptive and expressive aphasia. Sitting on the side of the bed. Not following verbal commands. . Nonverbal making unrecognizable sounds and grunts. Will follow some visual  commands   Smiles pleasantly. Perrl. EOMI. Smile symmetric. Tracks examiner across the room. Moving all limbs spontaneously but less the left arm. No ataxia noted. Can stand independentally and set off the alarm. Blinks to threat bilaterally. Drift left cannot assess FTN. Wide bassed gait.    ASSESSMENT/PLAN [name] is a 64 y.o. female with history of DM, HTN, chronic body aches, and stroke 5-7 years ago who presented with a 2 day history of aphasia, confusion, and kept pointing to her left leg. She did not receive IV t-PA due to late presentation (>4.5 hours from time of onset).   Stroke: multiple bilateral infarcts - embolic - source unknown.  CT head - Multiple bilateral infarcts. The largest infarct involves the left frontal and parietal lobes and appears subacute consistent with history of 2 days of symptoms. Multiple infarcts have associated encephalomalacia suggesting prior remote infarcts in many locations.  MRI head - Acute/subacute infarct left parietal lobe with extensive edema in the cortex. No associated hemorrhage. Chronic hemorrhagic infarct left frontal lobe. Chronic infarcts in the occipital lobe bilaterally left greater than right. Chronic infarct left cerebellum.   MRA head - negative  2D Echo -normal ejection fraction.  No cardiac source of embolism.  Carotid Doppler B ICA 1-39% stenosis, VAs antegrade   TEE scheduled for tomorrow 8/31 to look for source of infarct   Loop placement if TEE negative     [employer] Virus 2 - negative  LDL - 94  HgbA1c - 6.8  UDS - negative  VTE prophylaxis - Lovenox  No antithrombotic prior to admission, now on aspirin 325 mg daily  Therapy recommendations:  OutPt   Disposition:  Pending  Hypertension  Home BP meds: amlodipine ; Zestril  Current BP meds: none   Stable . Permissive hypertension (OK if < 220/120) but gradually normalize in 5-7 days  . Long-term BP goal normotensive  Hyperlipidemia  Home  Lipid lowering medication: none   LDL 94, goal < 70  Current lipid lowering medication: Lipitor 40 mg daily   Continue statin at discharge  Diabetes  Home diabetic meds: metformin  Current diabetic meds: SSI   HgbA1c 6.8, goal < 7.0 Recent Labs    03/30/20 1604 03/30/20 2126 03/31/20 0606  GLUCAP 128* 152* 137*    Other Stroke Risk Factors  Advanced age  Family hx stroke (sister and brother)   Hx stroke/TIA      Other Active Problems      Hospital day # 2  She has presented with aphasia due to embolic left MCA branch infarct.  Recommend check TEE for cardiac source of embolism and loop recorder insertion for paroxysmal A. fib at discharge.  Continue aspirin Plavix for 3 weeks followed by aspirin alone.  No family available at the bedside for discussion.  Discussed with Dr. Eliseo Squires.  Greater than 50% time during this 25-minute visit was spent on counseling and coordination of care about embolic stroke and discussion with care team and answering questions.  Antony Contras, MD To contact Stroke Continuity provider, please refer to [email or web address] After hours, contact General Neurology

## 2020-03-31 NOTE — Progress Notes (Signed)
    [facility] has been requested to perform a transesophageal echocardiogram on Nabiha Kawano for stroke.  After careful review of history and examination, the risks and benefits of transesophageal echocardiogram have been explained to the patients daughter including risks of esophageal damage, perforation (1:10,000 risk), bleeding, pharyngeal hematoma as well as other potential complications associated with conscious sedation including aspiration, arrhythmia, respiratory failure and death. Alternatives to treatment were discussed, questions were answered. Patient and family are willing to proceed.   Kathyrn Drown, NP  03/31/2020 6:20 PM

## 2020-03-31 NOTE — Progress Notes (Signed)
Physical Therapy Treatment Patient Details Name: Sonya George MRN: [id number] DOB: 12-04-1955 Today's Date: 03/31/2020    History of Present Illness 64 y.o. female with medical history significant of CVA; HTN; and DM presenting with aphasia. MRI +acute/subacute L parietal lobe with extensive edema ; chronic hemorrhagic infarct L frontal lobe; chronic infarcts B occipital lobes L greater than R; chronic infarct L cerebellum    PT Comments    Continuing work on functional mobility and activity tolerance;  Session focused on progressive amb, and pt was able to walk the hallways with minguard assist, needing multimodal cues for pathfinding; Pt held the mobile phone that we were using to Facetime with her daughter during session; some small losses of balance which she recovered from with stepping strategy typically; Agree with OT and ST for the plan for home with Outpt PT/OT/ST follow up   Follow Up Recommendations  Outpatient PT;Supervision/Assistance - 24 hour     Equipment Recommendations  None recommended by PT    Recommendations for Other Services       Precautions / Restrictions Precautions Precautions: Fall    Mobility  Bed Mobility Overal bed mobility: Modified Independent             General bed mobility comments: Getting OOB around the bedrails upon entry  Transfers Overall transfer level: Needs assistance Equipment used: None Transfers: Sit to/from Stand Sit to Stand: Min guard (without physical contact)         General transfer comment: No LOB observe; miniguard due to visual deficits/unfamiliar environment  Ambulation/Gait Ambulation/Gait assistance: Min guard (with and without physical contact) Gait Distance (Feet): 200 Feet Assistive device: None Gait Pattern/deviations: Step-through pattern;Decreased step length - right;Decreased step length - left;Wide base of support Gait velocity: reduced   General Gait Details: pt with increased lateral sway  and drift during ambulation, pt requires constant cueing for direction and for safety awareness with impaired attention to task. Pt demonstrates limited safety awareness at times during mobility needing frequent cues for redirection; held mobile phone that we were using to FaceTime with her daughter while ambulating   Stairs             Wheelchair Mobility    Modified Rankin (Stroke Patients Only) Modified Rankin (Stroke Patients Only) Pre-Morbid Rankin Score: No significant disability Modified Rankin: Moderately severe disability     Balance     Sitting balance-Leahy Scale: Good       Standing balance-Leahy Scale: Fair                              Cognition Arousal/Alertness: Awake/alert Behavior During Therapy: Anxious Overall Cognitive Status: Impaired/Different from baseline                                 General Comments: apparently adn appropriately frustrated at diffiuclty with communication      Exercises      General Comments General comments (skin integrity, edema, etc.): Face-timed with daughter for help with communication; Sonya George indicated that pt was not making sense/confused      Pertinent Vitals/Pain Pain Assessment: No/denies pain Faces Pain Scale: No hurt    Home Living                      Prior Function            PT Goals (current  goals can now be found in the care plan section) Acute Rehab PT Goals Patient Stated Goal: per daughter for her mother to get better PT Goal Formulation: With family Time For Goal Achievement: 04/13/20 Potential to Achieve Goals: Good Progress towards PT goals: Progressing toward goals    Frequency    Min 4X/week      PT Plan Discharge plan needs to be updated    Co-evaluation              AM-PAC PT "6 Clicks" Mobility   Outcome Measure  Help needed turning from your back to your side while in a flat bed without using bedrails?: None Help needed  moving from lying on your back to sitting on the side of a flat bed without using bedrails?: None Help needed moving to and from a bed to a chair (including a wheelchair)?: A Little Help needed standing up from a chair using your arms (e.g., wheelchair or bedside chair)?: None Help needed to walk in hospital room?: None Help needed climbing 3-5 steps with a railing? : A Little 6 Click Score: 22    End of Session Equipment Utilized During Treatment: Other (comment) (mobile phone to facetime with daughter) Activity Tolerance: Patient tolerated treatment well Patient left: in bed;with call bell/phone within reach;with bed alarm set Nurse Communication: Mobility status PT Visit Diagnosis: Unsteadiness on feet (R26.81);Other abnormalities of gait and mobility (R26.89);Other symptoms and signs involving the nervous system (K87.681)     Time: 1572-6203 PT Time Calculation (min) (ACUTE ONLY): 21 min  Charges:  $Gait Training: 8-22 mins                     Roney Marion, PT  Acute Rehabilitation Services Pager [phone] Office [place] 03/31/2020, 2:24 PM

## 2020-03-31 NOTE — Progress Notes (Signed)
Progress Note    Sonya George  WPY:[id number] DOB: 07-04-1956  DOA: 03/29/2020 PCP: Maximiano Coss, NP    Brief Narrative:    Medical records reviewed and are as summarized below:  Sonya George is an 64 y.o. female with medical history significant of CVA; HTN; and DM presenting with aphasia.  She started acting not like herself, forgot her daughter's name and called her other family member's names.  She was also having trouble communicating with her daughter.  Her last stroke in [place] also affected her speech.  She also hasn't been eating and has been acting abnormal and can't communicate.  She has been acting this way for a couple of days but it got worse last night.   Assessment/Plan:   Principal Problem:   CVA (cerebral vascular accident) ([place]) Active Problems:   Diabetes ([place])   Essential hypertension   CVA -Patient with reported prior h/o CVA presenting with aphasia and confusion for several days but worse last night -MRI: Acute/subacute infarct left parietal lobe with extensive edema in the cortex. No associated hemorrhage Chronic hemorrhagic infarct left frontal lobe. Chronic infarcts in the occipital lobe bilaterally left greater than right. Chronic infarct left cerebellum. Right frontal para falcine calcified mass compatible with meningioma. -Carotid dopplers -Echo pending -needs TEE/loop on tuesday- cardiology has been consulted -ASA daily (has not been taking regularly so this does not appear to be ASA failure) -Neurology consult appreciated -tele shows NSR up to this point, continue to monitor -PT/OT/ST/Nutrition Consults: ? CIR  HTN -Allow permissive HTNfor now -Treat BP only if >220/120, and then with goal of 15% reduction -HoldNorvasc, Lisinopriland plan to restart in 48-72 hours  HLD -LDL 95, goal <70 -Start Lipitor 40 mg daily  DM -A1c in 2020 showed 5.6-- > 2021 6.8 -Hold home PO Glucophage -Will order moderate-scale  SSI   Family Communication/Anticipated D/C date and plan/Code Status   DVT prophylaxis: Lovenox ordered. Code Status: Full Code.  Family Communication: daughter at bedside Disposition Plan: Status is: Inpatient  Remains inpatient appropriate because:Inpatient level of care appropriate due to severity of illness   Dispo: The patient is from: Home              Anticipated d/c is to: tbd              Anticipated d/c date is: 2 days              Patient currently is not medically stable to d/c.         Medical Consultants:    Neurology  Cardiology (TEE)     Subjective:   Able to make needs known -- gestured to me to close door  Objective:    Vitals:   03/30/20 1707 03/30/20 2129 03/31/20 0114 03/31/20 0544  BP: 138/69 (!) 141/73 126/77 130/81  Pulse: 76 89 81 82  Resp: 18 18 18 18   Temp: 98.8 F (37.1 C) 98.3 F (36.8 C) 97.9 F (36.6 C) 98 F (36.7 C)  TempSrc: Oral Oral Oral Oral  SpO2: 98% 98% 99% 96%   No intake or output data in the 24 hours ending 03/31/20 1143 There were no vitals filed for this visit.  Exam: Able to make needs known rrr No increased work of breathing Moved all 4ext  Data Reviewed:   I have personally reviewed following labs and imaging studies:  Labs: Labs show the following:   Basic Metabolic Panel: Recent Labs  Lab 03/29/20 1128  NA 139  K 4.3  CL 105  CO2 25  GLUCOSE 138*  BUN 9  CREATININE 0.67  CALCIUM 9.6   GFR CrCl cannot be calculated (Unknown ideal weight.). Liver Function Tests: Recent Labs  Lab 03/29/20 1128  AST 25  ALT 13  ALKPHOS 77  BILITOT 0.9  PROT 7.6  ALBUMIN 3.8   No results for input(s): LIPASE, AMYLASE in the last 168 hours. No results for input(s): AMMONIA in the last 168 hours. Coagulation profile Recent Labs  Lab 03/29/20 1128  INR 1.1    CBC: Recent Labs  Lab 03/29/20 1128  WBC 9.1  NEUTROABS 6.1  HGB 13.1  HCT 40.0  MCV 90.9  PLT 278   Cardiac  Enzymes: No results for input(s): CKTOTAL, CKMB, CKMBINDEX, TROPONINI in the last 168 hours. BNP (last 3 results) No results for input(s): PROBNP in the last 8760 hours. CBG: Recent Labs  Lab 03/30/20 1124 03/30/20 1604 03/30/20 2126 03/31/20 0606 03/31/20 1107  GLUCAP 116* 128* 152* 137* 103*   D-Dimer: No results for input(s): DDIMER in the last 72 hours. Hgb A1c: No results for input(s): HGBA1C in the last 72 hours. Lipid Profile: Recent Labs    03/30/20 0531  CHOL 149  HDL 38*  LDLCALC 95  TRIG 82  CHOLHDL 3.9   Thyroid function studies: Recent Labs    03/29/20 1643  TSH 0.751   Anemia work up: No results for input(s): VITAMINB12, FOLATE, FERRITIN, TIBC, IRON, RETICCTPCT in the last 72 hours. Sepsis Labs: Recent Labs  Lab 03/29/20 1128  WBC 9.1    Microbiology Recent Results (from the past 240 hour(s))  SARS Coronavirus 2 by RT PCR (hospital order, performed in [facility] hospital lab) Nasopharyngeal Nasopharyngeal Swab     Status: None   Collection Time: 03/29/20  4:22 PM   Specimen: Nasopharyngeal Swab  Result Value Ref Range Status   SARS Coronavirus 2 NEGATIVE NEGATIVE Final    Comment: (NOTE) SARS-CoV-2 target nucleic acids are NOT DETECTED.  The SARS-CoV-2 RNA is generally detectable in upper and lower respiratory specimens during the acute phase of infection. The lowest concentration of SARS-CoV-2 viral copies this assay can detect is 250 copies / mL. A negative result does not preclude SARS-CoV-2 infection and should not be used as the sole basis for treatment or other patient management decisions.  A negative result may occur with improper specimen collection / handling, submission of specimen other than nasopharyngeal swab, presence of viral mutation(s) within the areas targeted by this assay, and inadequate number of viral copies (<250 copies / mL). A negative result must be combined with clinical observations, patient history, and  epidemiological information.  Fact Sheet for Patients:   StrictlyIdeas.no  Fact Sheet for Healthcare Providers: BankingDealers.co.za  This test is not yet approved or  cleared by the [place] FDA and has been authorized for detection and/or diagnosis of SARS-CoV-2 by FDA under an Emergency Use Authorization (EUA).  This EUA will remain in effect (meaning this test can be used) for the duration of the COVID-19 declaration under Section 564(b)(1) of the Act, 21 U.S.C. section 360bbb-3(b)(1), unless the authorization is terminated or revoked sooner.  Performed at [facility], [place] [address] [place], [place] [address]     Procedures and diagnostic studies:  DG Chest 1 View  Result Date: 03/29/2020 CLINICAL DATA:  Pre MRI screening. EXAM: CHEST  1 VIEW COMPARISON:  None. FINDINGS: No evidence of implanted medical device or radiopaque foreign body to preclude  MRI imaging. Linear density projecting over the thoracic inlet is presumably related to patient's mask. Mild cardiomegaly. Tortuous thoracic aorta. Subsegmental atelectasis in the lung bases. Pulmonary vasculature is normal. No consolidation, pleural effusion, or pneumothorax. No acute osseous abnormalities are seen. IMPRESSION: 1. No evidence of implanted medical device or radiopaque foreign body to preclude MRI imaging. 2. Mild cardiomegaly and bibasilar atelectasis. Electronically Signed   By: Keith Rake M.D.   On: 03/29/2020 17:15   DG Pelvis 1-2 Views  Result Date: 03/29/2020 CLINICAL DATA:  Pre MRI screening. EXAM: PELVIS - 1-2 VIEW COMPARISON:  None. FINDINGS: No visualized radiopaque foreign body or implanted medical device to preclude MRI imaging. The cortical margins of the bony pelvis are intact. No fracture. Pubic symphysis and sacroiliac joints are congruent. Both femoral heads are well-seated in the respective acetabula. Pelvic bowel gas pattern is  unremarkable. IMPRESSION: No radiopaque foreign body or implanted medical device in the pelvis to preclude MRI imaging. Electronically Signed   By: Keith Rake M.D.   On: 03/29/2020 17:16   DG Abd 1 View  Result Date: 03/29/2020 CLINICAL DATA:  Pre MRI screening. EXAM: ABDOMEN - 1 VIEW COMPARISON:  None. FINDINGS: There are no radiopaque foreign bodies or evidence of implanted medical devices to preclude MRI imaging. Nonobstructive bowel gas pattern. Suggestion of sigmoid colonic redundancy with mild gaseous distension. No small bowel dilatation or obstruction. Single right upper quadrant calcification is nonspecific. No acute osseous abnormalities are seen. IMPRESSION: 1. No radiopaque foreign bodies or evidence of implanted medical devices to preclude MRI imaging. 2. Nonobstructive bowel gas pattern. Electronically Signed   By: Keith Rake M.D.   On: 03/29/2020 17:18   CT HEAD WO CONTRAST  Result Date: 03/29/2020 CLINICAL DATA:  Neuro deficit. Acute stroke suspected. Aphasia. The patient's symptoms have been present for 2 days. EXAM: CT HEAD WITHOUT CONTRAST TECHNIQUE: Contiguous axial images were obtained from the base of the skull through the vertex without intravenous contrast. COMPARISON:  None. FINDINGS: Brain: No subdural, epidural, and subarachnoid hemorrhage. The patient has multiple infarcts. There appears to be a large subacute infarct in the left MCA territory involving the frontal and parietal lobes. An area of encephalomalacia in the left frontal lobe probably represents sequela of previous infarct. Encephalomalacia in the left occipital lobe likely represents a previous remote infarct. A small infarct in the right occipital lobe also has associated cephalo malacia suggesting it is nonacute. An age indeterminate infarct is seen in the right posterior parietal lobe on series 3, image 14. Encephalomalacia in the posterior right frontal lobe on series 3, image 24 is consistent with  another site of previous infarct. There is an infarct in the posterior left cerebellar hemisphere. The cerebellum, brainstem, and basal cisterns are otherwise normal. Small lacunar infarct in the right thalamus. Small lacunar infarct in the left basal ganglia on axial image 12. Ventricles are unremarkable. Sulci are unremarkable. No midline shift. Vascular: No hyperdense vessel or unexpected calcification. Skull: Normal. Negative for fracture or focal lesion. Sinuses/Orbits: No acute finding. Other: None. IMPRESSION: 1. Multiple bilateral infarcts. The largest infarct involves the left frontal and parietal lobes and appears subacute consistent with history of 2 days of symptoms. Multiple infarcts have associated encephalomalacia suggesting prior remote infarcts in many locations. 2. No other acute intracranial abnormalities are identified. Electronically Signed   By: Dorise Bullion III M.D   On: 03/29/2020 12:52   MR ANGIO HEAD WO CONTRAST  Result Date: 03/29/2020 CLINICAL DATA:  Stroke  EXAM: MRI HEAD WITHOUT CONTRAST MRA HEAD WITHOUT CONTRAST TECHNIQUE: Multiplanar, multiecho pulse sequences of the brain and surrounding structures were obtained without intravenous contrast. Angiographic images of the head were obtained using MRA technique without contrast. COMPARISON:  CT head 03/29/2020 FINDINGS: MRI HEAD FINDINGS Brain: Acute/subacute infarct in the left parietal lobe which shows restricted diffusion and extensive edema on T2 and FLAIR. Additional possible small areas of acute infarct in the right frontal white matter and in the right parietal cortex although these could be related to chronic infarct. Chronic infarct in the left frontal lobe, left occipital lobe. Small chronic infarct right occipital lobe. Chronic infarct left cerebellum. Mild atrophy. Ventricular size normal. Chronic hemorrhage in the left frontal infarct. Densely calcified mass to the right of the frontal falx most likely meningioma  measuring 10 x 17 mm. No edema in the adjacent brain. Vascular: Normal arterial flow voids. Skull and upper cervical spine: No focal skeletal lesion. Sinuses/Orbits: Mild mucosal edema paranasal sinuses. Negative orbit Other: None MRA HEAD FINDINGS Both vertebral arteries patent to the basilar. Left PICA patent. Right PICA very small. Basilar widely patent. Superior cerebellar and posterior cerebral arteries patent bilaterally without stenosis. Small posterior communicating arteries patent bilaterally. Internal carotid artery widely patent bilaterally. Right anterior and middle cerebral arteries patent without stenosis. Left anterior cerebral artery patent. Right middle cerebral artery patent bilaterally without stenosis. There is decreased signal in the inferior division left MCA due to tortuosity. Negative for aneurysm. IMPRESSION: 1. Acute/subacute infarct left parietal lobe with extensive edema in the cortex. No associated hemorrhage 2. Chronic hemorrhagic infarct left frontal lobe. Chronic infarcts in the occipital lobe bilaterally left greater than right. Chronic infarct left cerebellum. 3. Right frontal para falcine calcified mass compatible with meningioma. 4. Negative MRA head Electronically Signed   By: Franchot Gallo M.D.   On: 03/29/2020 18:59   MR BRAIN WO CONTRAST  Result Date: 03/29/2020 CLINICAL DATA:  Stroke EXAM: MRI HEAD WITHOUT CONTRAST MRA HEAD WITHOUT CONTRAST TECHNIQUE: Multiplanar, multiecho pulse sequences of the brain and surrounding structures were obtained without intravenous contrast. Angiographic images of the head were obtained using MRA technique without contrast. COMPARISON:  CT head 03/29/2020 FINDINGS: MRI HEAD FINDINGS Brain: Acute/subacute infarct in the left parietal lobe which shows restricted diffusion and extensive edema on T2 and FLAIR. Additional possible small areas of acute infarct in the right frontal white matter and in the right parietal cortex although these could  be related to chronic infarct. Chronic infarct in the left frontal lobe, left occipital lobe. Small chronic infarct right occipital lobe. Chronic infarct left cerebellum. Mild atrophy. Ventricular size normal. Chronic hemorrhage in the left frontal infarct. Densely calcified mass to the right of the frontal falx most likely meningioma measuring 10 x 17 mm. No edema in the adjacent brain. Vascular: Normal arterial flow voids. Skull and upper cervical spine: No focal skeletal lesion. Sinuses/Orbits: Mild mucosal edema paranasal sinuses. Negative orbit Other: None MRA HEAD FINDINGS Both vertebral arteries patent to the basilar. Left PICA patent. Right PICA very small. Basilar widely patent. Superior cerebellar and posterior cerebral arteries patent bilaterally without stenosis. Small posterior communicating arteries patent bilaterally. Internal carotid artery widely patent bilaterally. Right anterior and middle cerebral arteries patent without stenosis. Left anterior cerebral artery patent. Right middle cerebral artery patent bilaterally without stenosis. There is decreased signal in the inferior division left MCA due to tortuosity. Negative for aneurysm. IMPRESSION: 1. Acute/subacute infarct left parietal lobe with extensive edema in the cortex. No  associated hemorrhage 2. Chronic hemorrhagic infarct left frontal lobe. Chronic infarcts in the occipital lobe bilaterally left greater than right. Chronic infarct left cerebellum. 3. Right frontal para falcine calcified mass compatible with meningioma. 4. Negative MRA head Electronically Signed   By: Franchot Gallo M.D.   On: 03/29/2020 18:59   ECHOCARDIOGRAM COMPLETE  Result Date: 03/30/2020    ECHOCARDIOGRAM REPORT   Patient Name:   NALAYA WOJDYLA Date of Exam: 03/30/2020 Medical Rec #:  [id number]        Height: Accession #:    [id number]       Weight:       170.8 lb Date of Birth:  04-20-1956       BSA:          1.818 m Patient Age:    32 years         BP:            141/82 mmHg Patient Gender: F                HR:           85 bpm. Exam Location:  Inpatient Procedure: 2D Echo Indications:    stroke 434.91  History:        Patient has no prior history of Echocardiogram examinations.                 Risk Factors:Diabetes and Hypertension.  Sonographer:    Johny Chess Referring Phys: [place]  1. Left ventricular ejection fraction, by estimation, is 60 to 65%. The left ventricle has normal function. The left ventricle has no regional wall motion abnormalities. Left ventricular diastolic parameters are consistent with Grade I diastolic dysfunction (impaired relaxation).  2. Right ventricular systolic function is normal. The right ventricular size is normal. There is normal pulmonary artery systolic pressure.  3. Left atrial size was moderately dilated.  4. The mitral valve is normal in structure. No evidence of mitral valve regurgitation. No evidence of mitral stenosis.  5. The aortic valve is normal in structure. Aortic valve regurgitation is mild to moderate. No aortic stenosis is present.  6. The inferior vena cava is normal in size with greater than 50% respiratory variability, suggesting right atrial pressure of 3 mmHg. Conclusion(s)/Recommendation(s): No intracardiac source of embolism detected on this transthoracic study. A transesophageal echocardiogram is recommended to exclude cardiac source of embolism if clinically indicated. FINDINGS  Left Ventricle: Left ventricular ejection fraction, by estimation, is 60 to 65%. The left ventricle has normal function. The left ventricle has no regional wall motion abnormalities. The left ventricular internal cavity size was normal in size. There is  no left ventricular hypertrophy. Left ventricular diastolic parameters are consistent with Grade I diastolic dysfunction (impaired relaxation). Right Ventricle: The right ventricular size is normal. No increase in right ventricular wall thickness. Right  ventricular systolic function is normal. There is normal pulmonary artery systolic pressure. The tricuspid regurgitant velocity is 2.40 m/s, and  with an assumed right atrial pressure of 3 mmHg, the estimated right ventricular systolic pressure is 40.1 mmHg. Left Atrium: Left atrial size was moderately dilated. Right Atrium: Right atrial size was normal in size. Pericardium: There is no evidence of pericardial effusion. Mitral Valve: The mitral valve is normal in structure. Normal mobility of the mitral valve leaflets. No evidence of mitral valve regurgitation. No evidence of mitral valve stenosis. Tricuspid Valve: The tricuspid valve is normal in structure. Tricuspid valve regurgitation is mild . No evidence  of tricuspid stenosis. Aortic Valve: The aortic valve is normal in structure. Aortic valve regurgitation is mild to moderate. Aortic regurgitation PHT measures 552 msec. No aortic stenosis is present. Pulmonic Valve: The pulmonic valve was normal in structure. Pulmonic valve regurgitation is not visualized. No evidence of pulmonic stenosis. Aorta: The aortic root is normal in size and structure. Venous: The inferior vena cava is normal in size with greater than 50% respiratory variability, suggesting right atrial pressure of 3 mmHg. IAS/Shunts: No atrial level shunt detected by color flow Doppler.  LEFT VENTRICLE PLAX 2D LVIDd:         5.40 cm  Diastology LVIDs:         3.40 cm  LV e' lateral:   7.29 cm/s LV PW:         0.90 cm  LV E/e' lateral: 6.4 LV IVS:        0.90 cm  LV e' medial:    5.55 cm/s LVOT diam:     1.70 cm  LV E/e' medial:  8.3 LV SV:         39 LV SV Index:   22 LVOT Area:     2.27 cm  RIGHT VENTRICLE             IVC RV S prime:     14.70 cm/s  IVC diam: 1.60 cm TAPSE (M-mode): 2.1 cm LEFT ATRIUM             Index       RIGHT ATRIUM           Index LA diam:        2.90 cm 1.60 cm/m  RA Area:     13.30 cm LA Vol (A2C):   45.6 ml 25.09 ml/m RA Volume:   29.20 ml  16.07 ml/m LA Vol (A4C):    36.9 ml 20.30 ml/m LA Biplane Vol: 42.6 ml 23.44 ml/m  AORTIC VALVE LVOT Vmax:   95.30 cm/s LVOT Vmean:  63.200 cm/s LVOT VTI:    0.173 m AI PHT:      552 msec  AORTA Ao Root diam: 3.40 cm Ao Asc diam:  3.30 cm MITRAL VALVE               TRICUSPID VALVE MV Area (PHT): 4.06 cm    TR Peak grad:   23.0 mmHg MV Decel Time: 187 msec    TR Vmax:        240.00 cm/s MV E velocity: 46.30 cm/s MV A velocity: 77.10 cm/s  SHUNTS MV E/A ratio:  0.60        Systemic VTI:  0.17 m                            Systemic Diam: 1.70 cm Candee Furbish MD Electronically signed by Candee Furbish MD Signature Date/Time: 03/30/2020/4:41:46 PM    Final    VAS US CAROTID (at [facility] and [facility] only)  Result Date: 03/30/2020 Carotid Arterial Duplex Study Indications:       CVA. Limitations        Today's exam was limited due to Patient inability to remain                    stationary during exam. Comparison Study:  No prior studies. Performing Technologist: Darlin Coco  Examination Guidelines: A complete evaluation includes B-mode imaging, spectral Doppler, color Doppler, and power Doppler as needed of all accessible  portions of each vessel. Bilateral testing is considered an integral part of a complete examination. Limited examinations for reoccurring indications may be performed as noted.  Right Carotid Findings: +----------+--------+--------+--------+------------------+------------------+           PSV cm/sEDV cm/sStenosisPlaque DescriptionComments           +----------+--------+--------+--------+------------------+------------------+ CCA Prox  70      16                                intimal thickening +----------+--------+--------+--------+------------------+------------------+ CCA Distal54      12                                intimal thickening +----------+--------+--------+--------+------------------+------------------+ ICA Prox  48      13              heterogenous                          +----------+--------+--------+--------+------------------+------------------+ ECA       24      5                                                    +----------+--------+--------+--------+------------------+------------------+ +----------+--------+-------+----------------+-------------------+           PSV cm/sEDV cmsDescribe        Arm Pressure (mmHG) +----------+--------+-------+----------------+-------------------+ [id number]             Multiphasic, WNL                    +----------+--------+-------+----------------+-------------------+ +---------+--------+--+--------+--+---------+ VertebralPSV cm/s50EDV cm/s11Antegrade +---------+--------+--+--------+--+---------+  Left Carotid Findings: +----------+--------+--------+--------+------------------+------------------+           PSV cm/sEDV cm/sStenosisPlaque DescriptionComments           +----------+--------+--------+--------+------------------+------------------+ CCA Prox  101     9                                 intimal thickening +----------+--------+--------+--------+------------------+------------------+ CCA Distal37      9                                 intimal thickening +----------+--------+--------+--------+------------------+------------------+ ICA Prox  41      11              heterogenous                         +----------+--------+--------+--------+------------------+------------------+ ICA Distal48      13                                                   +----------+--------+--------+--------+------------------+------------------+ ECA       49      12                                                   +----------+--------+--------+--------+------------------+------------------+ +----------+--------+--------+----------------+-------------------+  PSV cm/sEDV cm/sDescribe        Arm Pressure (mmHG) +----------+--------+--------+----------------+-------------------+  [id number]              Multiphasic, WNL                    +----------+--------+--------+----------------+-------------------+ +---------+--------+--+--------+-+---------+ VertebralPSV cm/s41EDV cm/s7Antegrade +---------+--------+--+--------+-+---------+   Summary: Right Carotid: Velocities in the right ICA are consistent with a 1-39% stenosis. Left Carotid: Velocities in the left ICA are consistent with a 1-39% stenosis. Vertebrals:  Bilateral vertebral arteries demonstrate antegrade flow. Subclavians: Normal flow hemodynamics were seen in bilateral subclavian              arteries. *See table(s) above for measurements and observations.  Electronically signed by Servando Snare MD on 03/30/2020 at 7:42:08 PM.    Final     Medications:   . aspirin  300 mg Rectal Daily   Or  . aspirin  325 mg Oral Daily  . atorvastatin  40 mg Oral Daily  . enoxaparin (LOVENOX) injection  40 mg Subcutaneous Q24H  . insulin aspart  0-15 Units Subcutaneous TID WC  . insulin aspart  0-5 Units Subcutaneous QHS   Continuous Infusions:    LOS: 2 days   Geradine Girt  [facility]   How to contact the [facility] Attending or Consulting provider [place] or covering provider during after hours [place], for this patient?  1. Check the care team in [employer] and look for a) attending/consulting [facility] provider listed and b) the [facility] team listed 2. Log into [email or web address] and use [place] universal password to access. If you do not have the password, please contact the hospital operator. 3. Locate the [facility] provider you are looking for under [facility] and page to a number that you can be directly reached. 4. If you still have difficulty reaching the provider, please page the [facility] (Director on Call) for the Hospitalists listed on amion for assistance.  03/31/2020, 11:43 AM

## 2020-03-31 NOTE — Progress Notes (Signed)
Unable to reach her daughter to obtain consent. Patient is from [place] and speak a dialect called Twi. Need daughter for translation. Attempted to call her daughter twice, unfortunately her voicemail is not set up and she did not pick up the phone.   She is tentatively scheduled for 2 PM tomorrow, will need to obtain consent again tomorrow.

## 2020-04-01 ENCOUNTER — Inpatient Hospital Stay (HOSPITAL_COMMUNITY): Payer: Medicaid Other | Admitting: Certified Registered"

## 2020-04-01 ENCOUNTER — Encounter (HOSPITAL_COMMUNITY)
Admission: EM | Disposition: A | Discharge status: Home / Self Care | Payer: Self-pay | Source: Home / Self Care | Attending: Internal Medicine

## 2020-04-01 ENCOUNTER — Encounter (HOSPITAL_COMMUNITY): Payer: Self-pay | Admitting: Internal Medicine

## 2020-04-01 ENCOUNTER — Inpatient Hospital Stay (HOSPITAL_COMMUNITY): Payer: Medicaid Other

## 2020-04-01 DIAGNOSIS — Z20822 Contact with and (suspected) exposure to covid-19: Secondary | ICD-10-CM | POA: Diagnosis not present

## 2020-04-01 DIAGNOSIS — I361 Nonrheumatic tricuspid (valve) insufficiency: Secondary | ICD-10-CM

## 2020-04-01 DIAGNOSIS — I63412 Cerebral infarction due to embolism of left middle cerebral artery: Secondary | ICD-10-CM | POA: Diagnosis not present

## 2020-04-01 DIAGNOSIS — I340 Nonrheumatic mitral (valve) insufficiency: Secondary | ICD-10-CM

## 2020-04-01 DIAGNOSIS — R4701 Aphasia: Secondary | ICD-10-CM | POA: Diagnosis not present

## 2020-04-01 DIAGNOSIS — R29703 NIHSS score 3: Secondary | ICD-10-CM | POA: Diagnosis not present

## 2020-04-01 DIAGNOSIS — I351 Nonrheumatic aortic (valve) insufficiency: Secondary | ICD-10-CM

## 2020-04-01 HISTORY — PX: TEE WITHOUT CARDIOVERSION: SHX5443

## 2020-04-01 HISTORY — PX: BUBBLE STUDY: SHX6837

## 2020-04-01 LAB — GLUCOSE, CAPILLARY
Glucose-Capillary: 153 mg/dL — ABNORMAL HIGH (ref 70–99)
Glucose-Capillary: 226 mg/dL — ABNORMAL HIGH (ref 70–99)
Glucose-Capillary: 91 mg/dL (ref 70–99)

## 2020-04-01 SURGERY — ECHOCARDIOGRAM, TRANSESOPHAGEAL
Anesthesia: Monitor Anesthesia Care

## 2020-04-01 MED ORDER — LACTATED RINGERS IV SOLN
INTRAVENOUS | Status: AC | PRN
  Administered 2020-04-01: 1000 mL via INTRAVENOUS

## 2020-04-01 MED ORDER — ASPIRIN EC 81 MG PO TBEC: 81.0000 mg | DELAYED_RELEASE_TABLET | Freq: Every day | ORAL | Status: DC

## 2020-04-01 MED ORDER — CLOPIDOGREL BISULFATE 75 MG PO TABS
75.0000 mg | ORAL_TABLET | Freq: Every day | ORAL | 0 refills | Status: DC
Start: 2020-04-02 — End: 2020-06-25

## 2020-04-01 MED ORDER — PROPOFOL 10 MG/ML IV BOLUS
INTRAVENOUS | Status: DC | PRN
  Administered 2020-04-01 (×2): 20 mg via INTRAVENOUS
  Administered 2020-04-01: 25 mg via INTRAVENOUS

## 2020-04-01 MED ORDER — SODIUM CHLORIDE 0.9 % IV SOLN: INTRAVENOUS | Status: DC

## 2020-04-01 MED ORDER — ATORVASTATIN CALCIUM 40 MG PO TABS: 40.0000 mg | ORAL_TABLET | Freq: Every day | ORAL | 0 refills | Status: AC

## 2020-04-01 MED ORDER — ASPIRIN 81 MG PO TBEC
81.0000 mg | DELAYED_RELEASE_TABLET | Freq: Every day | ORAL | 11 refills | Status: DC
Start: 2020-04-02 — End: 2020-06-25

## 2020-04-01 MED ORDER — CLOPIDOGREL BISULFATE 75 MG PO TABS: 75.0000 mg | ORAL_TABLET | Freq: Every day | ORAL | Status: DC

## 2020-04-01 MED ORDER — PROPOFOL 500 MG/50ML IV EMUL
INTRAVENOUS | Status: DC | PRN
  Administered 2020-04-01: 150 ug/kg/min via INTRAVENOUS

## 2020-04-01 MED FILL — CLOPIDOGREL 75 MG TABLET: 75 | 21 days supply | Qty: 21 | Fill #0

## 2020-04-01 MED FILL — ATORVASTATIN CALCIUM 40 MG: 40 | 30 days supply | Qty: 30 | Fill #0

## 2020-04-01 NOTE — Discharge Instructions (Signed)
24 hour supervision

## 2020-04-01 NOTE — Progress Notes (Signed)
  Echocardiogram Echocardiogram Transesophageal has been performed.  Sonya George 04/01/2020, 2:46 PM

## 2020-04-01 NOTE — Progress Notes (Signed)
Physical Therapy Treatment George Details Name: Sonya George MRN: [id number] DOB: 1956-06-07 Today's Date: 04/01/2020    History of Present Illness 64 y.o. female with medical history significant of CVA; HTN; and DM presenting with aphasia. MRI +acute/subacute L parietal lobe with extensive edema ; chronic hemorrhagic infarct L frontal lobe; chronic infarcts B occipital lobes L greater than R; chronic infarct L cerebellum    Sonya Comments    Continuing work on functional mobility and activity tolerance;  Session focused on continuing progressive amb and stair negotiation; Sonya George was initially hesitant to go up and down Sonya steps, but after completing Sonya task (see gait section for details), she seemed very pleased; Her George joined [place] after Sonya steps, and I asked her George to ask Sonya Sonya what we did; her George indicated Sonya Sonya wasn't making much sense; We tried again, this time showing Sonya Sonya a picture of Sonya stairs we went up, and her George said Sonya George asked, "everyday?" -- her George interpreted that as meaning Sonya George liked Sonya stairs and would like to do them again;   I am interested to see how she responds to Sonya same stairs task tomorrow, to see about carryover, or familiarity with Sonya task  Follow Up Recommendations  Outpatient Sonya;Supervision/Assistance - 24 hour     Equipment Recommendations  None recommended by Sonya    Recommendations for Other Services       Precautions / Restrictions Precautions Precautions: Fall Precaution Comments: Fall risk is low, but present Restrictions Weight Bearing Restrictions: No    Mobility  Bed Mobility Overal bed mobility: Modified Independent                Transfers Overall transfer level: Needs assistance Equipment used: None Transfers: Sit to/from Stand Sit to Stand: Supervision         General transfer comment: Supervision, no assist needed  Ambulation/Gait Ambulation/Gait  assistance: Min guard (with and without physical contact) Gait Distance (Feet): 150 Feet Assistive device: 1 person hand held assist Gait Pattern/deviations: Step-through pattern     General Gait Details: Noted RLE slightly internally rotated in stance; Tended to hold this therapist's hand less for steadiness and balance, and more for leading, and also what felt like companionship   Stairs Stairs: Yes Stairs assistance: Min assist Stair Management: One rail Right;Alternating pattern;Step to pattern;Forwards;One rail Left (alternating ascending; step-by-step descending) Number of Stairs: 18 General stair comments: Transported in a wheelchair to Sonya large open staircase in Sonya central part of Sonya building that goes from Sonya first floor to Sonya second floor; initially, Sonya expressed anxiety at going up Sonya flight of steps with gestures; We sat for a few minutes, and then she agreed; ascended Sonya steps with R hand on rail and L handheld assist; ascended mostly step by step with occasional alternating steps; descnded steps one at a time, with R hand on rail and L handheld assist; Seemed pleased once done   Wheelchair Mobility    Modified Rankin (Stroke Patients Only) Modified Rankin (Stroke Patients Only) Pre-Morbid Rankin Score: No significant disability Modified Rankin: Moderately severe disability     Balance Overall balance assessment: Needs assistance Sitting-balance support: No upper extremity supported;Feet supported Sitting balance-Leahy Scale: Good     Standing balance support: No upper extremity supported Standing balance-Leahy Scale: Good Standing balance comment: picking up linens from floor  Cognition Arousal/Alertness: Awake/alert Behavior During Therapy: WFL for tasks assessed/performed;Anxious Overall Cognitive Status: Impaired/Different from baseline Area of Impairment: Following commands;Problem solving                        Following Commands: Follows multi-step commands inconsistently     Problem Solving: Difficulty sequencing;Requires tactile cues;Requires verbal cues General Comments: Sonya George present and assisting with interpreting. Per George, Sonya still not making a lot of sense when talking. Unable to follow directions for IADL tasks today      Exercises      General Comments General comments (skin integrity, edema, etc.): Sonya's George joined [place] at teh end of session      Pertinent Vitals/Pain Pain Assessment: Faces Faces Pain Scale: No hurt Pain Intervention(s): Monitored during session    Home Living                      Prior Function            Sonya Goals (current goals can now be found in Sonya care plan section) Acute Rehab Sonya Goals George Stated Goal: per George for her mother to get better Sonya Goal Formulation: With family Time For Goal Achievement: 04/13/20 Potential to Achieve Goals: Good Progress towards Sonya goals: Progressing toward goals    Frequency    Min 4X/week      Sonya Plan Current plan remains appropriate    Co-evaluation              AM-PAC Sonya "6 Clicks" Mobility   Outcome Measure  Help needed turning from your back to your side while in a flat bed without using bedrails?: None Help needed moving from lying on your back to sitting on Sonya side of a flat bed without using bedrails?: None Help needed moving to and from a bed to a chair (including a wheelchair)?: None Help needed standing up from a chair using your arms (e.g., wheelchair or bedside chair)?: A Little Help needed to walk in hospital room?: None Help needed climbing 3-5 steps with a railing? : A Little 6 Click Score: 22    End of Session Equipment Utilized During Treatment: Gait belt Activity Tolerance: George tolerated treatment well George left: in bed;with call bell/phone within reach;with family/visitor present Nurse Communication: Mobility status Sonya Visit  Diagnosis: Unsteadiness on feet (R26.81);Other abnormalities of gait and mobility (R26.89);Other symptoms and signs involving Sonya nervous system (R29.898)     Time: 0930-1006 Sonya Time Calculation (min) (ACUTE ONLY): 36 min  Charges:  $Gait Training: 23-37 mins                     Roney Marion, [place] Pager [phone] Office [phone]    Colletta [place] 04/01/2020, 1:19 PM

## 2020-04-01 NOTE — Anesthesia Procedure Notes (Signed)
Procedure Name: MAC Date/Time: 04/01/2020 1:55 PM Performed by: Imagene Riches, CRNA Pre-anesthesia Checklist: Patient identified, Suction available, Emergency Drugs available, Patient being monitored and Timeout performed Patient Re-evaluated:Patient Re-evaluated prior to induction Oxygen Delivery Method: Nasal cannula

## 2020-04-01 NOTE — Progress Notes (Signed)
Patient off floor to Endo for TEE.

## 2020-04-01 NOTE — Progress Notes (Signed)
    [facility] has been requested to perform a transesophageal echocardiogram on Sonya George for stroke.  After careful review of history and examination, the risks and benefits of transesophageal echocardiogram have been explained including risks of esophageal damage, perforation (1:10,000 risk), bleeding, pharyngeal hematoma as well as other potential complications associated with conscious sedation including aspiration, arrhythmia, respiratory failure and death. Alternatives to treatment were discussed, questions were answered. Patient is willing to proceed.    Daughter was present in the room for translation for her mother (Twi). Through the daughter, patient understands risks of procedure and willing to proceed.  Tami Lin Kizer [place], [place]  04/01/2020 10:22 AM

## 2020-04-01 NOTE — Progress Notes (Signed)
Occupational Therapy Treatment Patient Details Name: Sonya George MRN: [id number] DOB: [birth year] Today's Date: 04/01/2020    History of present illness 64 y.o. female with medical history significant of CVA; HTN; and DM presenting with aphasia. MRI +acute/subacute L parietal lobe with extensive edema ; chronic hemorrhagic infarct L frontal lobe; chronic infarcts B occipital lobes L greater than R; chronic infarct L cerebellum   OT comments  Pt progressing towards OT goals and demonstrated ability to complete grooming tasks standing at sink with Setup, minor difficulties noted with coordinating task but no physical assist needed. Attempted to gain participation in IADL tasks based on valued occupations of folding linens to assess bimanual coordination, visual and balance. Pt noted to be able to sort linens and pick items up from floor without LOB, but had difficulty understanding directions to actually fold the linens despite multimodal cueing. Pt's daughter present to assist with interpreting and reports pt still not making sense at times. Attempted to gain pt participation in LB dressing with pt requesting daughter assist with task. Pt's daughter reports feeling competent to provide ADL care at home.    Follow Up Recommendations  Outpatient OT (Neuro OP)    Equipment Recommendations  None recommended by OT    Recommendations for Other Services      Precautions / Restrictions Precautions Precautions: Fall Precaution Comments: Fall risk is low, but present Restrictions Weight Bearing Restrictions: No       Mobility Bed Mobility Overal bed mobility: Modified Independent                Transfers Overall transfer level: Needs assistance Equipment used: None Transfers: Sit to/from Stand Sit to Stand: Supervision         General transfer comment: Supervision, no assist needed    Balance Overall balance assessment: Needs assistance Sitting-balance support: No upper  extremity supported;Feet supported Sitting balance-Leahy Scale: Good     Standing balance support: No upper extremity supported Standing balance-Leahy Scale: Good Standing balance comment: picking up linens from floor                            ADL either performed or assessed with clinical judgement   ADL Overall ADL's : Needs assistance/impaired     Grooming: Set up;Standing;Wash/dry hands;Oral care Grooming Details (indicate cue type and reason): Setup for grooming task standing at sink. pt noted to put toothpaste in mouth, then brushing teeth.                Lower Body Dressing Details (indicate cue type and reason): Attempted to get pt to don clean socks, but pt asking daughter to assist.             Functional mobility during ADLs: Min guard General ADL Comments: Pt continues to communication difficulties, appearing more due to CVA than language barrier per daughter translating. Attempted IADL assessment for folding linens standing at bedside based on valued occupations, but pt unable to understand directions with daughter interpreting.      Vision   Vision Assessment?: Vision impaired- to be further tested in functional context Additional Comments: Pt with improved location of items today (all same colored linens, picking them up and sorting)   Perception     Praxis      Cognition Arousal/Alertness: Awake/alert Behavior During Therapy: [facility] for tasks assessed/performed;Anxious Overall Cognitive Status: Impaired/Different from baseline Area of Impairment: Following commands;Problem solving  Following Commands: Follows multi-step commands inconsistently     Problem Solving: Difficulty sequencing;Requires tactile cues;Requires verbal cues General Comments: Pt daughter present and assisting with interpreting. Per daughter, pt still not making a lot of sense when talking. Unable to follow directions for IADL tasks today         Exercises     Shoulder Instructions       General Comments      Pertinent Vitals/ Pain       Pain Assessment: Faces Faces Pain Scale: No hurt Pain Intervention(s): Monitored during session  Home Living                                          Prior Functioning/Environment              Frequency  Min 2X/week        Progress Toward Goals  OT Goals(current goals can now be found in the care plan section)  Progress towards OT goals: Progressing toward goals  Acute Rehab OT Goals Patient Stated Goal: per daughter for her mother to get better OT Goal Formulation: With patient/family Time For Goal Achievement: 04/13/20 Potential to Achieve Goals: Good ADL Goals Pt Will Perform Grooming: with set-up;with supervision;standing Pt Will Perform Upper Body Bathing: with set-up;sitting Pt Will Perform Lower Body Bathing: with set-up;sit to/from stand Additional ADL Goal #1: Daughter will verbalize understanidng of 3 strateiges to use to assist her mother with ADL and increase her safety  Plan Discharge plan remains appropriate    Co-evaluation                 AM-PAC OT "6 Clicks" Daily Activity     Outcome Measure   Help from another person eating meals?: A Little Help from another person taking care of personal grooming?: A Little Help from another person toileting, which includes using toliet, bedpan, or urinal?: A Little Help from another person bathing (including washing, rinsing, drying)?: A Little Help from another person to put on and taking off regular upper body clothing?: A Little Help from another person to put on and taking off regular lower body clothing?: A Little 6 Click Score: 18    End of Session        Activity Tolerance Patient tolerated treatment well   Patient Left in bed;with call bell/phone within reach;with bed alarm set   Nurse Communication Mobility status        Time: 1118-1140 OT Time  Calculation (min): 22 min  Charges: OT General Charges $OT Visit: 1 Visit OT Treatments $Self Care/Home Management : 8-22 mins  Layla Maw, OTR/L   Layla Maw 04/01/2020, 11:53 AM

## 2020-04-01 NOTE — Progress Notes (Signed)
STROKE TEAM PROGRESS NOTE   INTERVAL HISTORY Patient is sitting up in bed.  Her daughter is at the bedside.  She is globally aphasic and not following commands.  As per her daughter she has lost her ability to speak or understand English after her stroke but can understand her native language a bit she is able to move all 4 extremities well against gravity.  Vital signs are stable. Neuro exam is unchanged OBJECTIVE Vitals:   04/01/20 1112 04/01/20 1319 04/01/20 1408 04/01/20 1417  BP: (!) 149/89 (!) 158/84 108/63 129/76  Pulse: 91 85 74 74  Resp: 17 17 15 16   Temp: (!) 97.3 F (36.3 C) 97.6 F (36.4 C) 97.9 F (36.6 C)   TempSrc: Oral Oral Oral   SpO2: 98% 100% 93% 95%  Weight:  77.1 kg     CBC:  Recent Labs  Lab 03/29/20 1128  WBC 9.1  NEUTROABS 6.1  HGB 13.1  HCT 40.0  MCV 90.9  PLT 761   Basic Metabolic Panel:  Recent Labs  Lab 03/29/20 1128  NA 139  K 4.3  CL 105  CO2 25  GLUCOSE 138*  BUN 9  CREATININE 0.67  CALCIUM 9.6   Lipid Panel:     Component Value Date/Time   CHOL 149 03/30/2020 0531   TRIG 82 03/30/2020 0531   HDL 38 (L) 03/30/2020 0531   CHOLHDL 3.9 03/30/2020 0531   VLDL 16 03/30/2020 0531   LDLCALC 95 03/30/2020 0531   HgbA1c:  Lab Results  Component Value Date   HGBA1C 6.8 (A) 12/14/2019   Urine Drug Screen:     Component Value Date/Time   LABOPIA NONE DETECTED 03/29/2020 1643   COCAINSCRNUR NONE DETECTED 03/29/2020 1643   LABBENZ NONE DETECTED 03/29/2020 1643   AMPHETMU NONE DETECTED 03/29/2020 1643   THCU NONE DETECTED 03/29/2020 1643   LABBARB NONE DETECTED 03/29/2020 1643    Alcohol Level No results found for: [facility]  IMAGING  DG Chest 1 View 03/29/2020 IMPRESSION:  1. No evidence of implanted medical device or radiopaque foreign body to preclude MRI imaging.  2. Mild cardiomegaly and bibasilar atelectasis.  DG Pelvis 1-2 Views 03/29/2020 IMPRESSION:  No radiopaque foreign body or implanted medical device in the pelvis  to preclude MRI imaging.  DG Abd 1 View 03/29/2020 IMPRESSION:  1. No radiopaque foreign bodies or evidence of implanted medical devices to preclude MRI imaging.  2. Nonobstructive bowel gas pattern.  CT HEAD WO CONTRAST 03/29/2020 IMPRESSION:  1. Multiple bilateral infarcts. The largest infarct involves the left frontal and parietal lobes and appears subacute consistent with history of 2 days of symptoms. Multiple infarcts have associated encephalomalacia suggesting prior remote infarcts in many locations.  2. No other acute intracranial abnormalities are identified.   MR BRAIN WO CONTRAST MR ANGIO HEAD WO CONTRAST 03/29/2020 IMPRESSION:  1. Acute/subacute infarct left parietal lobe with extensive edema in the cortex. No associated hemorrhage  2. Chronic hemorrhagic infarct left frontal lobe. Chronic infarcts in the occipital lobe bilaterally left greater than right. Chronic infarct left cerebellum.  3. Right frontal para falcine calcified mass compatible with meningioma.  4. Negative MRA head   Transthoracic Echocardiogram  Ejection fraction 60 to 65%.  No cardiac source of embolism.  Bilateral Carotid Dopplers  03/30/2020 Right Carotid: Velocities in the right ICA are consistent with a 1-39% stenosis.  Left Carotid: Velocities in the left ICA are consistent with a 1-39% stenosis.  Vertebrals: Bilateral vertebral arteries demonstrate antegrade flow.  Subclavians: Normal flow hemodynamics were seen in bilateral subclavian arteries.   ECG - SR rate 96 BPM. (See cardiology reading for complete details)   PHYSICAL EXAM    Blood pressure 129/76, pulse 74, temperature 97.9 F (36.6 C), temperature source Oral, resp. rate 16, weight 77.1 kg, SpO2 95 %. Frail African middle-aged lady not in distress. . Afebrile. Head is nontraumatic. Neck is supple without bruit.    Cardiac exam no murmur or gallop. Lungs are clear to auscultation. Distal pulses are well felt. Neurological Exam  : Awake and alert.  Receptive and expressive aphasia. Sitting on the side of the bed. Not following verbal commands. . Nonverbal making unrecognizable sounds and grunts. Will follow some visual commands   Smiles pleasantly. Perrl. EOMI. Smile symmetric. Tracks examiner across the room. Moving all limbs spontaneously but less the left arm. No ataxia noted. Can stand independentally and set off the alarm. Blinks to threat bilaterally. Drift left cannot assess FTN. Wide bassed gait.    ASSESSMENT/PLAN [name] is a 64 y.o. female with history of DM, HTN, chronic body aches, and stroke 5-7 years ago who presented with a 2 day history of aphasia, confusion, and kept pointing to her left leg. She did not receive IV t-PA due to late presentation (>4.5 hours from time of onset).   Stroke: multiple bilateral infarcts - embolic - source unknown.  CT head - Multiple bilateral infarcts. The largest infarct involves the left frontal and parietal lobes and appears subacute consistent with history of 2 days of symptoms. Multiple infarcts have associated encephalomalacia suggesting prior remote infarcts in many locations.  MRI head - Acute/subacute infarct left parietal lobe with extensive edema in the cortex. No associated hemorrhage. Chronic hemorrhagic infarct left frontal lobe. Chronic infarcts in the occipital lobe bilaterally left greater than right. Chronic infarct left cerebellum.   MRA head - negative  2D Echo -normal ejection fraction.  No cardiac source of embolism.  Carotid Doppler B ICA 1-39% stenosis, VAs antegrade   TEE scheduled for tomorrow 8/31 to look for source of infarct   Loop placement if TEE negative     [employer] Virus 2 - negative  LDL - 94  HgbA1c - 6.8  UDS - negative  VTE prophylaxis - Lovenox  No antithrombotic prior to admission, now on aspirin 325 mg daily  Therapy recommendations:  OutPt   Disposition:  Pending  Hypertension  Home BP meds:  amlodipine ; Zestril  Current BP meds: none   Stable . Permissive hypertension (OK if < 220/120) but gradually normalize in 5-7 days  . Long-term BP goal normotensive  Hyperlipidemia  Home Lipid lowering medication: none   LDL 94, goal < 70  Current lipid lowering medication: Lipitor 40 mg daily   Continue statin at discharge  Diabetes  Home diabetic meds: metformin  Current diabetic meds: SSI   HgbA1c 6.8, goal < 7.0 Recent Labs    04/01/20 0608 04/01/20 1106 04/01/20 1621  GLUCAP 153* 226* 91    Other Stroke Risk Factors  Advanced age  Family hx stroke (sister and brother)   Hx stroke/TIA      Other Active Problems      Hospital day # 3  She has presented with aphasia due to embolic left MCA branch infarct.  TEE was negative for cardiac source of embolism and consider loop recorder insertion for paroxysmal A. fib at discharge if she can afford it.  Continue aspirin and Plavix for 3 weeks followed  by aspirin alone.  No family available at the bedside for discussion.  Discussed with Dr. Eliseo Squires.  Greater than 50% time during this 25-minute visit was spent on counseling and coordination of care about embolic stroke and discussion with care team and answering questions. Stroke team will sign off.  Kindly call for questions. To contact Stroke Continuity provider, please refer to [email or web address] After hours, contact General Neurology

## 2020-04-01 NOTE — Transfer of Care (Signed)
Immediate Anesthesia Transfer of Care Note  Patient: Sonya George  Procedure(s) Performed: TRANSESOPHAGEAL ECHOCARDIOGRAM (TEE) (N/A ) BUBBLE STUDY  Patient Location: Endoscopy Unit  Anesthesia Type:MAC  Level of Consciousness: drowsy  Airway & Oxygen Therapy: Patient Spontanous Breathing and Patient connected to nasal cannula oxygen  Post-op Assessment: Report given to RN and Post -op Vital signs reviewed and stable  Post vital signs: Reviewed and stable  Last Vitals:  Vitals Value Taken Time  BP 108/63 04/01/20 1408  Temp 36.6 C 04/01/20 1408  Pulse 70 04/01/20 1412  Resp 14 04/01/20 1412  SpO2 97 % 04/01/20 1412  Vitals shown include unvalidated device data.  Last Pain:  Vitals:   04/01/20 1408  TempSrc: Oral  PainSc:          Complications: No complications documented.

## 2020-04-01 NOTE — Care Management (Signed)
Patient being discharged. Received message from attending daughter cannot pick her up until tomorrow. NCM called daughter Sonya George [phone] , expalined patient is being discharged today. Daughter is at work until [employer] and there is no one at home to care for her mother at present. [name] will pick her mother up at 11pm this evening. Instructed her to call nurses station [phone] before her arrival for instructions. [facility] filled prescriptions through [facility] and daughter will pay $13.00 over phone with card.   Attending and bedside nurse aware.

## 2020-04-01 NOTE — Interval H&P Note (Signed)
History and Physical Interval Note:  04/01/2020 1:41 PM  Sonya George  has presented today for surgery, with the diagnosis of STROKE.  The various methods of treatment have been discussed with the patient and family. After consideration of risks, benefits and other options for treatment, the patient has consented to  Procedure(s): TRANSESOPHAGEAL ECHOCARDIOGRAM (TEE) (N/A) as a surgical intervention.  The patient's history has been reviewed, patient examined, no change in status, stable for surgery.  I have reviewed the patient's chart and labs.  Questions were answered to the patient's satisfaction.     Jenkins Rouge

## 2020-04-01 NOTE — Progress Notes (Signed)
Spoke with patient's daughter [name] and she says a man named Richardson Landry is here to pick the patient up. Discharge instructions were reviewed with daughter and meds are being sent home with patient.

## 2020-04-01 NOTE — Discharge Summary (Signed)
Physician Discharge Summary  Sonya George TDS:[id number] DOB: Mar 01, 1956 DOA: 03/29/2020  PCP: Maximiano Coss, NP  Admit date: 03/29/2020 Discharge date: 04/01/2020  Admitted From: home Discharge disposition: home   Recommendations for Outpatient Follow-Up:   1. Outpatient PT/OT 2. Ambulatory referral to neuro 3. 24/7 supervision 4. Monitor LTFs/FLP   Discharge Diagnosis:   Principal Problem:   CVA (cerebral vascular accident) [facility]) Active Problems:   Diabetes ([place])   Essential hypertension    Discharge Condition: Improved.  Diet recommendation: Low sodium, heart healthy.  Carbohydrate-modified.  Wound care: None.  Code status: Full.   History of Present Illness:   Sonya George is a 64 y.o. female with medical history significant of CVA; HTN; and DM presenting with aphasia.  She started acting not like herself, forgot her daughter's name and called her other family member's names.  She was also having trouble communicating with her daughter.  Her last stroke in [place] also affected her speech.  She also hasn't been eating and has been acting abnormal and can't communicate.  She has been acting this way for a couple of days but it got worse last night.  She hasn't eaten much for 3 days, rejecting food, and so not sure if difficulty swallowing.  She complained of weakness in her R > L leg (but also sometimes L leg too).  +headache.  She speaks [place], from [place].  She was not taking daily ASA.   Hospital Course by Problem:   Stroke: multiple bilateral infarcts - embolic - source unknown. -CT head - Multiple bilateral infarcts. The largest infarct involves the left frontal and parietal lobes and appears subacute consistent with history of 2 days of symptoms. Multiple infarcts have associated encephalomalacia suggesting prior remote infarcts in many locations. -MRI head - Acute/subacute infarct left parietal lobe with extensive edema in the cortex. No  associated hemorrhage. Chronic hemorrhagic infarct left frontal lobe. Chronic infarcts in the occipital lobe bilaterally left greater than right. Chronic infarct left cerebellum.  -MRA head - negative -2D Echo -normal ejection fraction.  No cardiac source of embolism. -Carotid Doppler B ICA 1-39% stenosis, VAs antegrade  -TEE unrevealing -unable to get loop due to insurance -ASA plus plavix for 3 weeks then ASA alone: discussed with Dr. Leonie Man -outpatient PT/OT  Hypertension -resume home meds  Hyperlipidemia -LDL 94, goal < 70 - Lipitor 40 mg daily   Diabetes- type 2 controlled - metformin -HgbA1c 6.8, goal < 7.0    Medical Consultants:    Neuro/cards  Discharge Exam:   Vitals:   04/01/20 1408 04/01/20 1417  BP: 108/63 129/76  Pulse: 74 74  Resp: 15 16  Temp: 97.9 F (36.6 C)   SpO2: 93% 95%   Vitals:   04/01/20 1112 04/01/20 1319 04/01/20 1408 04/01/20 1417  BP: (!) 149/89 (!) 158/84 108/63 129/76  Pulse: 91 85 74 74  Resp: 17 17 15 16   Temp: (!) 97.3 F (36.3 C) 97.6 F (36.4 C) 97.9 F (36.6 C)   TempSrc: Oral Oral Oral   SpO2: 98% 100% 93% 95%  Weight:  77.1 kg      General exam: Appears calm and comfortable  The results of significant diagnostics from this hospitalization (including imaging, microbiology, ancillary and laboratory) are listed below for reference.     Procedures and Diagnostic Studies:   DG Chest 1 View  Result Date: 03/29/2020 CLINICAL DATA:  Pre MRI screening. EXAM: CHEST  1 VIEW COMPARISON:  None. FINDINGS: No evidence  of implanted medical device or radiopaque foreign body to preclude MRI imaging. Linear density projecting over the thoracic inlet is presumably related to patient's mask. Mild cardiomegaly. Tortuous thoracic aorta. Subsegmental atelectasis in the lung bases. Pulmonary vasculature is normal. No consolidation, pleural effusion, or pneumothorax. No acute osseous abnormalities are seen. IMPRESSION: 1. No evidence of  implanted medical device or radiopaque foreign body to preclude MRI imaging. 2. Mild cardiomegaly and bibasilar atelectasis. Electronically Signed   By: Keith Rake M.D.   On: 03/29/2020 17:15   DG Pelvis 1-2 Views  Result Date: 03/29/2020 CLINICAL DATA:  Pre MRI screening. EXAM: PELVIS - 1-2 VIEW COMPARISON:  None. FINDINGS: No visualized radiopaque foreign body or implanted medical device to preclude MRI imaging. The cortical margins of the bony pelvis are intact. No fracture. Pubic symphysis and sacroiliac joints are congruent. Both femoral heads are well-seated in the respective acetabula. Pelvic bowel gas pattern is unremarkable. IMPRESSION: No radiopaque foreign body or implanted medical device in the pelvis to preclude MRI imaging. Electronically Signed   By: Keith Rake M.D.   On: 03/29/2020 17:16   DG Abd 1 View  Result Date: 03/29/2020 CLINICAL DATA:  Pre MRI screening. EXAM: ABDOMEN - 1 VIEW COMPARISON:  None. FINDINGS: There are no radiopaque foreign bodies or evidence of implanted medical devices to preclude MRI imaging. Nonobstructive bowel gas pattern. Suggestion of sigmoid colonic redundancy with mild gaseous distension. No small bowel dilatation or obstruction. Single right upper quadrant calcification is nonspecific. No acute osseous abnormalities are seen. IMPRESSION: 1. No radiopaque foreign bodies or evidence of implanted medical devices to preclude MRI imaging. 2. Nonobstructive bowel gas pattern. Electronically Signed   By: Keith Rake M.D.   On: 03/29/2020 17:18   CT HEAD WO CONTRAST  Result Date: 03/29/2020 CLINICAL DATA:  Neuro deficit. Acute stroke suspected. Aphasia. The patient's symptoms have been present for 2 days. EXAM: CT HEAD WITHOUT CONTRAST TECHNIQUE: Contiguous axial images were obtained from the base of the skull through the vertex without intravenous contrast. COMPARISON:  None. FINDINGS: Brain: No subdural, epidural, and subarachnoid hemorrhage. The  patient has multiple infarcts. There appears to be a large subacute infarct in the left MCA territory involving the frontal and parietal lobes. An area of encephalomalacia in the left frontal lobe probably represents sequela of previous infarct. Encephalomalacia in the left occipital lobe likely represents a previous remote infarct. A small infarct in the right occipital lobe also has associated cephalo malacia suggesting it is nonacute. An age indeterminate infarct is seen in the right posterior parietal lobe on series 3, image 14. Encephalomalacia in the posterior right frontal lobe on series 3, image 24 is consistent with another site of previous infarct. There is an infarct in the posterior left cerebellar hemisphere. The cerebellum, brainstem, and basal cisterns are otherwise normal. Small lacunar infarct in the right thalamus. Small lacunar infarct in the left basal ganglia on axial image 12. Ventricles are unremarkable. Sulci are unremarkable. No midline shift. Vascular: No hyperdense vessel or unexpected calcification. Skull: Normal. Negative for fracture or focal lesion. Sinuses/Orbits: No acute finding. Other: None. IMPRESSION: 1. Multiple bilateral infarcts. The largest infarct involves the left frontal and parietal lobes and appears subacute consistent with history of 2 days of symptoms. Multiple infarcts have associated encephalomalacia suggesting prior remote infarcts in many locations. 2. No other acute intracranial abnormalities are identified. Electronically Signed   By: Dorise Bullion III M.D   On: 03/29/2020 12:52   MR ANGIO HEAD  WO CONTRAST  Result Date: 03/29/2020 CLINICAL DATA:  Stroke EXAM: MRI HEAD WITHOUT CONTRAST MRA HEAD WITHOUT CONTRAST TECHNIQUE: Multiplanar, multiecho pulse sequences of the brain and surrounding structures were obtained without intravenous contrast. Angiographic images of the head were obtained using MRA technique without contrast. COMPARISON:  CT head 03/29/2020  FINDINGS: MRI HEAD FINDINGS Brain: Acute/subacute infarct in the left parietal lobe which shows restricted diffusion and extensive edema on T2 and FLAIR. Additional possible small areas of acute infarct in the right frontal white matter and in the right parietal cortex although these could be related to chronic infarct. Chronic infarct in the left frontal lobe, left occipital lobe. Small chronic infarct right occipital lobe. Chronic infarct left cerebellum. Mild atrophy. Ventricular size normal. Chronic hemorrhage in the left frontal infarct. Densely calcified mass to the right of the frontal falx most likely meningioma measuring 10 x 17 mm. No edema in the adjacent brain. Vascular: Normal arterial flow voids. Skull and upper cervical spine: No focal skeletal lesion. Sinuses/Orbits: Mild mucosal edema paranasal sinuses. Negative orbit Other: None MRA HEAD FINDINGS Both vertebral arteries patent to the basilar. Left PICA patent. Right PICA very small. Basilar widely patent. Superior cerebellar and posterior cerebral arteries patent bilaterally without stenosis. Small posterior communicating arteries patent bilaterally. Internal carotid artery widely patent bilaterally. Right anterior and middle cerebral arteries patent without stenosis. Left anterior cerebral artery patent. Right middle cerebral artery patent bilaterally without stenosis. There is decreased signal in the inferior division left MCA due to tortuosity. Negative for aneurysm. IMPRESSION: 1. Acute/subacute infarct left parietal lobe with extensive edema in the cortex. No associated hemorrhage 2. Chronic hemorrhagic infarct left frontal lobe. Chronic infarcts in the occipital lobe bilaterally left greater than right. Chronic infarct left cerebellum. 3. Right frontal para falcine calcified mass compatible with meningioma. 4. Negative MRA head Electronically Signed   By: Franchot Gallo M.D.   On: 03/29/2020 18:59   MR BRAIN WO CONTRAST  Result Date:  03/29/2020 CLINICAL DATA:  Stroke EXAM: MRI HEAD WITHOUT CONTRAST MRA HEAD WITHOUT CONTRAST TECHNIQUE: Multiplanar, multiecho pulse sequences of the brain and surrounding structures were obtained without intravenous contrast. Angiographic images of the head were obtained using MRA technique without contrast. COMPARISON:  CT head 03/29/2020 FINDINGS: MRI HEAD FINDINGS Brain: Acute/subacute infarct in the left parietal lobe which shows restricted diffusion and extensive edema on T2 and FLAIR. Additional possible small areas of acute infarct in the right frontal white matter and in the right parietal cortex although these could be related to chronic infarct. Chronic infarct in the left frontal lobe, left occipital lobe. Small chronic infarct right occipital lobe. Chronic infarct left cerebellum. Mild atrophy. Ventricular size normal. Chronic hemorrhage in the left frontal infarct. Densely calcified mass to the right of the frontal falx most likely meningioma measuring 10 x 17 mm. No edema in the adjacent brain. Vascular: Normal arterial flow voids. Skull and upper cervical spine: No focal skeletal lesion. Sinuses/Orbits: Mild mucosal edema paranasal sinuses. Negative orbit Other: None MRA HEAD FINDINGS Both vertebral arteries patent to the basilar. Left PICA patent. Right PICA very small. Basilar widely patent. Superior cerebellar and posterior cerebral arteries patent bilaterally without stenosis. Small posterior communicating arteries patent bilaterally. Internal carotid artery widely patent bilaterally. Right anterior and middle cerebral arteries patent without stenosis. Left anterior cerebral artery patent. Right middle cerebral artery patent bilaterally without stenosis. There is decreased signal in the inferior division left MCA due to tortuosity. Negative for aneurysm. IMPRESSION: 1. Acute/subacute infarct  left parietal lobe with extensive edema in the cortex. No associated hemorrhage 2. Chronic hemorrhagic  infarct left frontal lobe. Chronic infarcts in the occipital lobe bilaterally left greater than right. Chronic infarct left cerebellum. 3. Right frontal para falcine calcified mass compatible with meningioma. 4. Negative MRA head Electronically Signed   By: Franchot Gallo M.D.   On: 03/29/2020 18:59   ECHOCARDIOGRAM COMPLETE  Result Date: 03/30/2020    ECHOCARDIOGRAM REPORT   Patient Name:   STACI DACK Date of Exam: 03/30/2020 Medical Rec #:  [id number]        Height: Accession #:    [id number]       Weight:       170.8 lb Date of Birth:  [birth year]       BSA:          1.818 m Patient Age:    62 years         BP:           141/82 mmHg Patient Gender: F                HR:           85 bpm. Exam Location:  Inpatient Procedure: 2D Echo Indications:    stroke 434.91  History:        Patient has no prior history of Echocardiogram examinations.                 Risk Factors:Diabetes and Hypertension.  Sonographer:    Johny Chess Referring Phys: [place]  1. Left ventricular ejection fraction, by estimation, is 60 to 65%. The left ventricle has normal function. The left ventricle has no regional wall motion abnormalities. Left ventricular diastolic parameters are consistent with Grade I diastolic dysfunction (impaired relaxation).  2. Right ventricular systolic function is normal. The right ventricular size is normal. There is normal pulmonary artery systolic pressure.  3. Left atrial size was moderately dilated.  4. The mitral valve is normal in structure. No evidence of mitral valve regurgitation. No evidence of mitral stenosis.  5. The aortic valve is normal in structure. Aortic valve regurgitation is mild to moderate. No aortic stenosis is present.  6. The inferior vena cava is normal in size with greater than 50% respiratory variability, suggesting right atrial pressure of 3 mmHg. Conclusion(s)/Recommendation(s): No intracardiac source of embolism detected on this transthoracic  study. A transesophageal echocardiogram is recommended to exclude cardiac source of embolism if clinically indicated. FINDINGS  Left Ventricle: Left ventricular ejection fraction, by estimation, is 60 to 65%. The left ventricle has normal function. The left ventricle has no regional wall motion abnormalities. The left ventricular internal cavity size was normal in size. There is  no left ventricular hypertrophy. Left ventricular diastolic parameters are consistent with Grade I diastolic dysfunction (impaired relaxation). Right Ventricle: The right ventricular size is normal. No increase in right ventricular wall thickness. Right ventricular systolic function is normal. There is normal pulmonary artery systolic pressure. The tricuspid regurgitant velocity is 2.40 m/s, and  with an assumed right atrial pressure of 3 mmHg, the estimated right ventricular systolic pressure is 57.2 mmHg. Left Atrium: Left atrial size was moderately dilated. Right Atrium: Right atrial size was normal in size. Pericardium: There is no evidence of pericardial effusion. Mitral Valve: The mitral valve is normal in structure. Normal mobility of the mitral valve leaflets. No evidence of mitral valve regurgitation. No evidence of mitral valve stenosis. Tricuspid Valve: The tricuspid valve is normal  in structure. Tricuspid valve regurgitation is mild . No evidence of tricuspid stenosis. Aortic Valve: The aortic valve is normal in structure. Aortic valve regurgitation is mild to moderate. Aortic regurgitation PHT measures 552 msec. No aortic stenosis is present. Pulmonic Valve: The pulmonic valve was normal in structure. Pulmonic valve regurgitation is not visualized. No evidence of pulmonic stenosis. Aorta: The aortic root is normal in size and structure. Venous: The inferior vena cava is normal in size with greater than 50% respiratory variability, suggesting right atrial pressure of 3 mmHg. IAS/Shunts: No atrial level shunt detected by color  flow Doppler.  LEFT VENTRICLE PLAX 2D LVIDd:         5.40 cm  Diastology LVIDs:         3.40 cm  LV e' lateral:   7.29 cm/s LV PW:         0.90 cm  LV E/e' lateral: 6.4 LV IVS:        0.90 cm  LV e' medial:    5.55 cm/s LVOT diam:     1.70 cm  LV E/e' medial:  8.3 LV SV:         39 LV SV Index:   22 LVOT Area:     2.27 cm  RIGHT VENTRICLE             IVC RV S prime:     14.70 cm/s  IVC diam: 1.60 cm TAPSE (M-mode): 2.1 cm LEFT ATRIUM             Index       RIGHT ATRIUM           Index LA diam:        2.90 cm 1.60 cm/m  RA Area:     13.30 cm LA Vol (A2C):   45.6 ml 25.09 ml/m RA Volume:   29.20 ml  16.07 ml/m LA Vol (A4C):   36.9 ml 20.30 ml/m LA Biplane Vol: 42.6 ml 23.44 ml/m  AORTIC VALVE LVOT Vmax:   95.30 cm/s LVOT Vmean:  63.200 cm/s LVOT VTI:    0.173 m AI PHT:      552 msec  AORTA Ao Root diam: 3.40 cm Ao Asc diam:  3.30 cm MITRAL VALVE               TRICUSPID VALVE MV Area (PHT): 4.06 cm    TR Peak grad:   23.0 mmHg MV Decel Time: 187 msec    TR Vmax:        240.00 cm/s MV E velocity: 46.30 cm/s MV A velocity: 77.10 cm/s  SHUNTS MV E/A ratio:  0.60        Systemic VTI:  0.17 m                            Systemic Diam: 1.70 cm Candee Furbish MD Electronically signed by Candee Furbish MD Signature Date/Time: 03/30/2020/4:41:46 PM    Final    VAS US CAROTID (at [facility] and [facility] only)  Result Date: 03/30/2020 Carotid Arterial Duplex Study Indications:       CVA. Limitations        Today's exam was limited due to Patient inability to remain                    stationary during exam. Comparison Study:  No prior studies. Performing Technologist: Darlin Coco  Examination Guidelines: A complete evaluation includes B-mode imaging, spectral Doppler,  color Doppler, and power Doppler as needed of all accessible portions of each vessel. Bilateral testing is considered an integral part of a complete examination. Limited examinations for reoccurring indications may be performed as noted.  Right Carotid Findings:  +----------+--------+--------+--------+------------------+------------------+           PSV cm/sEDV cm/sStenosisPlaque DescriptionComments           +----------+--------+--------+--------+------------------+------------------+ CCA Prox  70      16                                intimal thickening +----------+--------+--------+--------+------------------+------------------+ CCA Distal54      12                                intimal thickening +----------+--------+--------+--------+------------------+------------------+ ICA Prox  48      13              heterogenous                         +----------+--------+--------+--------+------------------+------------------+ ECA       24      5                                                    +----------+--------+--------+--------+------------------+------------------+ +----------+--------+-------+----------------+-------------------+           PSV cm/sEDV cmsDescribe        Arm Pressure (mmHG) +----------+--------+-------+----------------+-------------------+ [id number]             Multiphasic, WNL                    +----------+--------+-------+----------------+-------------------+ +---------+--------+--+--------+--+---------+ VertebralPSV cm/s50EDV cm/s11Antegrade +---------+--------+--+--------+--+---------+  Left Carotid Findings: +----------+--------+--------+--------+------------------+------------------+           PSV cm/sEDV cm/sStenosisPlaque DescriptionComments           +----------+--------+--------+--------+------------------+------------------+ CCA Prox  101     9                                 intimal thickening +----------+--------+--------+--------+------------------+------------------+ CCA Distal37      9                                 intimal thickening +----------+--------+--------+--------+------------------+------------------+ ICA Prox  41      11               heterogenous                         +----------+--------+--------+--------+------------------+------------------+ ICA Distal48      13                                                   +----------+--------+--------+--------+------------------+------------------+ ECA       49      12                                                   +----------+--------+--------+--------+------------------+------------------+ +----------+--------+--------+----------------+-------------------+  PSV cm/sEDV cm/sDescribe        Arm Pressure (mmHG) +----------+--------+--------+----------------+-------------------+ [id number]              Multiphasic, WNL                    +----------+--------+--------+----------------+-------------------+ +---------+--------+--+--------+-+---------+ VertebralPSV cm/s41EDV cm/s7Antegrade +---------+--------+--+--------+-+---------+   Summary: Right Carotid: Velocities in the right ICA are consistent with a 1-39% stenosis. Left Carotid: Velocities in the left ICA are consistent with a 1-39% stenosis. Vertebrals:  Bilateral vertebral arteries demonstrate antegrade flow. Subclavians: Normal flow hemodynamics were seen in bilateral subclavian              arteries. *See table(s) above for measurements and observations.  Electronically signed by Servando Snare MD on 03/30/2020 at 7:42:08 PM.    Final      Labs:   Basic Metabolic Panel: Recent Labs  Lab 03/29/20 1128  NA 139  K 4.3  CL 105  CO2 25  GLUCOSE 138*  BUN 9  CREATININE 0.67  CALCIUM 9.6   GFR CrCl cannot be calculated (Unknown ideal weight.). Liver Function Tests: Recent Labs  Lab 03/29/20 1128  AST 25  ALT 13  ALKPHOS 77  BILITOT 0.9  PROT 7.6  ALBUMIN 3.8   No results for input(s): LIPASE, AMYLASE in the last 168 hours. No results for input(s): AMMONIA in the last 168 hours. Coagulation profile Recent Labs  Lab 03/29/20 1128  INR 1.1    CBC: Recent Labs  Lab  03/29/20 1128  WBC 9.1  NEUTROABS 6.1  HGB 13.1  HCT 40.0  MCV 90.9  PLT 278   Cardiac Enzymes: No results for input(s): CKTOTAL, CKMB, CKMBINDEX, TROPONINI in the last 168 hours. BNP: Invalid input(s): POCBNP CBG: Recent Labs  Lab 03/31/20 1559 03/31/20 1833 03/31/20 2120 04/01/20 0608 04/01/20 1106  GLUCAP 222* 117* 138* 153* 226*   D-Dimer No results for input(s): DDIMER in the last 72 hours. Hgb A1c No results for input(s): HGBA1C in the last 72 hours. Lipid Profile Recent Labs    03/30/20 0531  CHOL 149  HDL 38*  LDLCALC 95  TRIG 82  CHOLHDL 3.9   Thyroid function studies Recent Labs    03/29/20 1643  TSH 0.751   Anemia work up No results for input(s): VITAMINB12, FOLATE, FERRITIN, TIBC, IRON, RETICCTPCT in the last 72 hours. Microbiology Recent Results (from the past 240 hour(s))  SARS Coronavirus 2 by RT PCR (hospital order, performed in [facility] hospital lab) Nasopharyngeal Nasopharyngeal Swab     Status: None   Collection Time: 03/29/20  4:22 PM   Specimen: Nasopharyngeal Swab  Result Value Ref Range Status   SARS Coronavirus 2 NEGATIVE NEGATIVE Final    Comment: (NOTE) SARS-CoV-2 target nucleic acids are NOT DETECTED.  The SARS-CoV-2 RNA is generally detectable in upper and lower respiratory specimens during the acute phase of infection. The lowest concentration of SARS-CoV-2 viral copies this assay can detect is 250 copies / mL. A negative result does not preclude SARS-CoV-2 infection and should not be used as the sole basis for treatment or other patient management decisions.  A negative result may occur with improper specimen collection / handling, submission of specimen other than nasopharyngeal swab, presence of viral mutation(s) within the areas targeted by this assay, and inadequate number of viral copies (<250 copies / mL). A negative result must be combined with clinical observations, patient history, and epidemiological  information.  Fact Sheet for Patients:   StrictlyIdeas.no  Fact Sheet for Healthcare Providers: BankingDealers.co.za  This test is not yet approved or  cleared by the [place] FDA and has been authorized for detection and/or diagnosis of SARS-CoV-2 by FDA under an Emergency Use Authorization (EUA).  This EUA will remain in effect (meaning this test can be used) for the duration of the COVID-19 declaration under Section 564(b)(1) of the Act, 21 U.S.C. section 360bbb-3(b)(1), unless the authorization is terminated or revoked sooner.  Performed at [facility], [place] [address], [place], [place] [address]      Discharge Instructions:   Discharge Instructions    Ambulatory referral to Neurology   Complete by: As directed    An appointment is requested in approximately: 6 weeks   Ambulatory referral to Occupational Therapy   Complete by: As directed    Ambulatory referral to Physical Therapy   Complete by: As directed    Ambulatory referral to Speech Therapy   Complete by: As directed    Diet - low sodium heart healthy   Complete by: As directed    Diet Carb Modified   Complete by: As directed    Discharge instructions   Complete by: As directed    ASA plus plavix x 3 weeks then ASA ALONE   Increase activity slowly   Complete by: As directed      Allergies as of 04/01/2020   No Known Allergies     Medication List    TAKE these medications   amLODipine 10 MG tablet Commonly known as: NORVASC Take 1 tablet (10 mg total) by mouth daily.   aspirin 81 MG EC tablet Take 1 tablet (81 mg total) by mouth daily. Swallow whole. Start taking on: April 02, 2020   atorvastatin 40 MG tablet Commonly known as: LIPITOR Take 1 tablet (40 mg total) by mouth daily. Start taking on: April 02, 2020   clopidogrel 75 MG tablet Commonly known as: PLAVIX Take 1 tablet (75 mg total) by mouth daily. Start taking on:  April 02, 2020   lisinopril 10 MG tablet Commonly known as: ZESTRIL Take 1 tablet (10 mg total) by mouth daily.   metFORMIN 500 MG tablet Commonly known as: GLUCOPHAGE Take 2 tablets (1,000 mg total) by mouth 2 (two) times daily with a meal.   multivitamin with minerals tablet Take 1 tablet by mouth daily.       Follow-up Information    [facility].   Specialty: Rehabilitation Contact information: [address] [place] [id number] [place] [address] [phone]               Time coordinating discharge: 35 min  Signed:  Geradine Girt DO  [facility] 04/01/2020, 3:24 PM

## 2020-04-03 NOTE — Anesthesia Postprocedure Evaluation (Signed)
Anesthesia Post Note  Patient: Sonya George  Procedure(s) Performed: TRANSESOPHAGEAL ECHOCARDIOGRAM (TEE) (N/A ) BUBBLE STUDY     Patient location during evaluation: Endoscopy Anesthesia Type: MAC Level of consciousness: awake and alert Pain management: pain level controlled Vital Signs Assessment: post-procedure vital signs reviewed and stable Respiratory status: spontaneous breathing, nonlabored ventilation, respiratory function stable and patient connected to nasal cannula oxygen Cardiovascular status: stable and blood pressure returned to baseline Postop Assessment: no apparent nausea or vomiting Anesthetic complications: no   No complications documented.  Last Vitals:  Vitals:   04/01/20 1408 04/01/20 1417  BP: 108/63 129/76  Pulse: 74 74  Resp: 15 16  Temp: 36.6 C   SpO2: 93% 95%    Last Pain:  Vitals:   04/01/20 1408  TempSrc: Oral  PainSc:                  [name]

## 2020-04-03 NOTE — Anesthesia Preprocedure Evaluation (Signed)
Anesthesia Evaluation  Patient identified by MRN, date of birth, ID band Patient awake    Reviewed: Allergy & Precautions, NPO status , Patient's Chart, lab work & pertinent test results  History of Anesthesia Complications Negative for: history of anesthetic complications  Airway Mallampati: III  TM Distance: >3 FB Neck ROM: Full    Dental  (+) Dental Advisory Given   Pulmonary neg recent URI,    breath sounds clear to auscultation       Cardiovascular hypertension, Pt. on medications (-) angina(-) Past MI  Rhythm:Regular     Neuro/Psych CVA negative psych ROS   GI/Hepatic negative GI ROS, Neg liver ROS,   Endo/Other  diabetes, Type 2  Renal/GU      Musculoskeletal   Abdominal   Peds  Hematology   Anesthesia Other Findings   Reproductive/Obstetrics                             Anesthesia Physical Anesthesia Plan  ASA: II  Anesthesia Plan: MAC   Post-op Pain Management:    Induction: Intravenous  PONV Risk Score and Plan: 2 and Propofol infusion and Treatment may vary due to age or medical condition  Airway Management Planned: Nasal Cannula  Additional Equipment: None  Intra-op Plan:   Post-operative Plan:   Informed Consent: I have reviewed the patients History and Physical, chart, labs and discussed the procedure including the risks, benefits and alternatives for the proposed anesthesia with the patient or authorized representative who has indicated his/her understanding and acceptance.     Dental advisory given  Plan Discussed with: CRNA  Anesthesia Plan Comments:         Anesthesia Quick Evaluation

## 2020-04-26 ENCOUNTER — Other Ambulatory Visit: Payer: Self-pay | Admitting: Registered Nurse

## 2020-04-26 DIAGNOSIS — I10 Essential (primary) hypertension: Secondary | ICD-10-CM

## 2020-04-26 DIAGNOSIS — E119 Type 2 diabetes mellitus without complications: Secondary | ICD-10-CM

## 2020-04-26 NOTE — Telephone Encounter (Signed)
Requested Prescriptions  Pending Prescriptions Disp Refills  . lisinopril (ZESTRIL) 10 MG tablet [Pharmacy Med Name: Lisinopril 10 MG Oral Tablet] 90 tablet     Sig: Take 1 tablet by mouth once daily     Cardiovascular:  ACE Inhibitors Passed - 04/26/2020 10:47 AM      Passed - Cr in normal range and within 180 days    Creatinine, Ser  Date Value Ref Range Status  03/29/2020 0.67 0.44 - 1.00 mg/dL Final         Passed - K in normal range and within 180 days    Potassium  Date Value Ref Range Status  03/29/2020 4.3 3.5 - 5.1 mmol/L Final         Passed - Patient is not pregnant      Passed - Last BP in normal range    BP Readings from Last 1 Encounters:  04/01/20 129/76         Passed - Valid encounter within last 6 months    Recent Outpatient Visits          4 months ago Essential hypertension   [facility], NP   1 year ago Encounter to establish care   [facility], Delfino Lovett, NP      Future Appointments            In 2 weeks Frann Rider, NP [facility]           . metFORMIN (GLUCOPHAGE) 500 MG tablet [Pharmacy Med Name: metFORMIN HCl 500 MG Oral Tablet] 360 tablet     Sig: TAKE 2 TABLETS BY MOUTH TWICE DAILY WITH A MEAL     Endocrinology:  Diabetes - Biguanides Passed - 04/26/2020 10:47 AM      Passed - Cr in normal range and within 360 days    Creatinine, Ser  Date Value Ref Range Status  03/29/2020 0.67 0.44 - 1.00 mg/dL Final         Passed - HBA1C is between 0 and 7.9 and within 180 days    Hemoglobin A1C  Date Value Ref Range Status  12/14/2019 6.8 (A) 4.0 - 5.6 % Final   Hgb A1c MFr Bld  Date Value Ref Range Status  01/02/2019 5.6 4.8 - 5.6 % Final    Comment:    (NOTE) Pre diabetes:          5.7%-6.4% Diabetes:              >6.4% Glycemic control for   <7.0% adults with diabetes          Passed - eGFR in normal range and within 360 days    GFR calc Af Amer  Date Value Ref  Range Status  03/29/2020 >60 >60 mL/min Final   GFR calc non Af Amer  Date Value Ref Range Status  03/29/2020 >60 >60 mL/min Final         Passed - Valid encounter within last 6 months    Recent Outpatient Visits          4 months ago Essential hypertension   [facility], Delfino Lovett, NP   1 year ago Encounter to establish care   [facility], Delfino Lovett, NP      Future Appointments            In 2 weeks Frann Rider, NP [facility]

## 2020-05-12 ENCOUNTER — Inpatient Hospital Stay: Payer: MEDICAID | Admitting: Adult Health

## 2020-05-12 ENCOUNTER — Encounter: Payer: Self-pay | Admitting: Adult Health

## 2020-05-12 NOTE — Progress Notes (Deleted)
[facility] [address] [place] [address] [phone]       Mind[place] Sanyah Molnar Date of Birth:  [birth year] Medical Record Number:  [id number]   Reason for Referral:  hospital stroke follow up    SUBJECTIVE:   CHIEF COMPLAINT:  No chief complaint on file.   HPI:   Sonya George is a 64 y.o. female with history of DM, HTN, chronic body aches, and stroke 5-7 years ago who presented on 03/21/2020 with a 2 day history of aphasia, confusion, and kept pointing to her left leg.   Stroke work-up revealed multiple bilateral infarcts, embolic secondary to unknown source.  Imaging also showed chronic left frontal lobe hemorrhagic infarct, chronic infarcts in the occipital lobes bilaterally and chronic infarct left cerebellum.  TEE unremarkable but unfortunately unable to have loop recorder placed due to lack of insurance.  Recommended DAPT for 3 weeks and aspirin alone.  HTN stable and resumed home medications.  LDL 94 and initiate atorvastatin 40 mg daily.  Controlled DM with A1c 6.8.  Other stroke risk factors include advanced age, family history of stroke and personal history of stroke.  Stroke: multiple bilateral infarcts - embolic - source unknown.  CT head - Multiple bilateral infarcts. The largest infarct involves the left frontal and parietal lobes and appears subacute consistent with history of 2 days of symptoms. Multiple infarcts have associated encephalomalacia suggesting prior remote infarcts in many locations.  MRI head - Acute/subacute infarct left parietal lobe with extensive edema in the cortex. No associated hemorrhage. Chronic hemorrhagic infarct left frontal lobe. Chronic infarcts in the occipital lobe bilaterally left greater than right. Chronic infarct left cerebellum.   MRA head - negative  2D Echo -normal ejection fraction.  No cardiac source of embolism.  Carotid Doppler B ICA 1-39% stenosis, VAs antegrade    TEE negative -unable to get loop recorder due to insurance   [employer] Virus 2 - negative  LDL - 94  HgbA1c - 6.8  UDS - negative  VTE prophylaxis - Lovenox  No antithrombotic prior to admission, now on aspirin 325 mg daily  Therapy recommendations:  OP OT/PT/SLP  Disposition: home        ROS:   14 system review of systems performed and negative with exception of ***  PMH:  Past Medical History:  Diagnosis Date  . Diabetes mellitus without complication ([place])   . Hypertension   . Stroke [facility])     PSH:  Past Surgical History:  Procedure Laterality Date  . BUBBLE STUDY  04/01/2020   Procedure: BUBBLE STUDY;  Surgeon: Josue Hector, MD;  Location: [facility];  Service: Cardiovascular;;  . TEE WITHOUT CARDIOVERSION N/A 04/01/2020   Procedure: TRANSESOPHAGEAL ECHOCARDIOGRAM (TEE);  Surgeon: Josue Hector, MD;  Location: [facility] ENDOSCOPY;  Service: Cardiovascular;  Laterality: N/A;    Social History:  Social History   Socioeconomic History  . Marital status: Married    Spouse name: Not on file  . Number of children: Not on file  . Years of education: Not on file  . Highest education level: Not on file  Occupational History  . Occupation: unemployed  Tobacco Use  . Smoking status: Never Smoker  . Smokeless tobacco: Never Used  Vaping Use  . Vaping Use: Never used  Substance and Sexual Activity  . Alcohol use: Never  . Drug use: Never  . Sexual activity: Not on file  Other Topics Concern  . Not  on file  Social History Narrative  . Not on file   Social Determinants of Health   Financial Resource Strain:   . Difficulty of Paying Living Expenses: Not on file  Food Insecurity:   . Worried About Charity fundraiser in the Last Year: Not on file  . Ran Out of Food in the Last Year: Not on file  Transportation Needs:   . Lack of Transportation (Medical): Not on file  . Lack of Transportation (Non-Medical): Not on file  Physical Activity:   . Days  of Exercise per Week: Not on file  . Minutes of Exercise per Session: Not on file  Stress:   . Feeling of Stress : Not on file  Social Connections:   . Frequency of Communication with Friends and Family: Not on file  . Frequency of Social Gatherings with Friends and Family: Not on file  . Attends Religious Services: Not on file  . Active Member of Clubs or Organizations: Not on file  . Attends Archivist Meetings: Not on file  . Marital Status: Not on file  Intimate Partner Violence:   . Fear of Current or Ex-Partner: Not on file  . Emotionally Abused: Not on file  . Physically Abused: Not on file  . Sexually Abused: Not on file    Family History:  Family History  Problem Relation Age of Onset  . Stroke Sister        Actively dying of CVA  . Stroke Brother        Died 2 weeks ago of CVA    Medications:   Current Outpatient Medications on File Prior to Visit  Medication Sig Dispense Refill  . amLODipine (NORVASC) 10 MG tablet Take 1 tablet (10 mg total) by mouth daily. 90 tablet 1  . aspirin EC 81 MG EC tablet Take 1 tablet (81 mg total) by mouth daily. Swallow whole. 30 tablet 11  . atorvastatin (LIPITOR) 40 MG tablet Take 1 tablet (40 mg total) by mouth daily. 30 tablet 0  . clopidogrel (PLAVIX) 75 MG tablet Take 1 tablet (75 mg total) by mouth daily. 21 tablet 0  . lisinopril (ZESTRIL) 10 MG tablet Take 1 tablet (10 mg total) by mouth daily. 90 tablet 1  . metFORMIN (GLUCOPHAGE) 500 MG tablet Take 2 tablets (1,000 mg total) by mouth 2 (two) times daily with a meal. 360 tablet 1  . Multiple Vitamins-Minerals (MULTIVITAMIN WITH MINERALS) tablet Take 1 tablet by mouth daily.     No current facility-administered medications on file prior to visit.    Allergies:  No Known Allergies    OBJECTIVE:  Physical Exam  There were no vitals filed for this visit. There is no height or weight on file to calculate BMI. No exam data present  Depression screen [facility] 2/9  12/14/2019  Decreased Interest 0  Down, Depressed, Hopeless 0  PHQ - 2 Score 0     General: well developed, well nourished, seated, in no evident distress Head: head normocephalic and atraumatic.   Neck: supple with no carotid or supraclavicular bruits Cardiovascular: regular rate and rhythm, no murmurs Musculoskeletal: no deformity Skin:  no rash/petichiae Vascular:  Normal pulses all extremities   Neurologic Exam Mental Status: Awake and fully alert. Oriented to place and time. Recent and remote memory intact. Attention span, concentration and fund of knowledge appropriate. Mood and affect appropriate.  Cranial Nerves: Fundoscopic exam reveals sharp disc margins. Pupils equal, briskly reactive to light. Extraocular movements full  without nystagmus. Visual fields full to confrontation. Hearing intact. Facial sensation intact. Face, tongue, palate moves normally and symmetrically.  Motor: Normal bulk and tone. Normal strength in all tested extremity muscles. Sensory.: intact to touch , pinprick , position and vibratory sensation.  Coordination: Rapid alternating movements normal in all extremities. Finger-to-nose and heel-to-shin performed accurately bilaterally. Gait and Station: Arises from chair without difficulty. Stance is normal. Gait demonstrates normal stride length and balance Reflexes: 1+ and symmetric. Toes downgoing.     NIHSS  *** Modified Rankin  *** CHA2DS2-VASc *** HAS-BLED ***     ASSESSMENT: Sonya George is a 64 y.o. year old female presented with 2-day history of aphasia, confusion and kept pointing to her left leg on 03/29/2020 with stroke work-up revealing multiple bilateral infarcts, embolic secondary to unknown source.  Unable to have loop recorder placed due to lack of insurance.  Vascular risk factors include prior strokes, HTN, HLD, and DM.      PLAN:  1. Cryptogenic stroke: Residual deficit: ***.  Recommend proceeding with loop recorder placement  once Medicaid approved.  Continue aspirin 81 mg daily  and atorvastatin for secondary stroke prevention. Close PCP follow up for aggressive stroke risk factor management  2. HTN: BP goal <130/90. Continue f/u with PCP 3. HLD: LDL goal <70. Recent LDL 94.  Continue atorvastatin 40 mg daily.  F/u with PCP for management as well as prescribing of statin 4. DMII: A1c goal<7.0. Recent A1c 6.8. F/u with PCP    Follow up in *** or call earlier if needed   I spent *** minutes of face-to-face and non-face-to-face time with patient.  This included previsit chart review, lab review, study review, order entry, electronic health record documentation, patient education regarding recent stroke, residual deficits, importance of managing stroke risk factors and answered all questions to patient satisfaction     Frann Rider, [facility]  [facility] [address] [place], [place] [address]  Phone [phone] Fax [phone] Note: This document was prepared with digital dictation and possible smart phrase technology. Any transcriptional errors that result from this process are unintentional.

## 2020-06-07 ENCOUNTER — Encounter (HOSPITAL_COMMUNITY): Payer: Self-pay

## 2020-06-07 ENCOUNTER — Emergency Department (HOSPITAL_COMMUNITY): Payer: Medicaid Other

## 2020-06-07 ENCOUNTER — Inpatient Hospital Stay (HOSPITAL_COMMUNITY)
Admission: EM | Admit: 2020-06-07 | Discharge: 2020-06-25 | DRG: 064 | Disposition: A | Discharge status: Home / Self Care | Payer: Medicaid Other | Attending: Internal Medicine | Admitting: Internal Medicine

## 2020-06-07 ENCOUNTER — Other Ambulatory Visit (HOSPITAL_COMMUNITY): Payer: Self-pay

## 2020-06-07 DIAGNOSIS — R4182 Altered mental status, unspecified: Secondary | ICD-10-CM

## 2020-06-07 DIAGNOSIS — F0150 Vascular dementia without behavioral disturbance: Secondary | ICD-10-CM | POA: Diagnosis present

## 2020-06-07 DIAGNOSIS — Z7982 Long term (current) use of aspirin: Secondary | ICD-10-CM

## 2020-06-07 DIAGNOSIS — D320 Benign neoplasm of cerebral meninges: Secondary | ICD-10-CM | POA: Diagnosis present

## 2020-06-07 DIAGNOSIS — I69318 Other symptoms and signs involving cognitive functions following cerebral infarction: Secondary | ICD-10-CM

## 2020-06-07 DIAGNOSIS — G9389 Other specified disorders of brain: Secondary | ICD-10-CM | POA: Diagnosis present

## 2020-06-07 DIAGNOSIS — I6340 Cerebral infarction due to embolism of unspecified cerebral artery: Principal | ICD-10-CM | POA: Diagnosis present

## 2020-06-07 DIAGNOSIS — Z7984 Long term (current) use of oral hypoglycemic drugs: Secondary | ICD-10-CM

## 2020-06-07 DIAGNOSIS — F0151 Vascular dementia with behavioral disturbance: Secondary | ICD-10-CM | POA: Diagnosis present

## 2020-06-07 DIAGNOSIS — R451 Restlessness and agitation: Secondary | ICD-10-CM

## 2020-06-07 DIAGNOSIS — Z823 Family history of stroke: Secondary | ICD-10-CM

## 2020-06-07 DIAGNOSIS — R339 Retention of urine, unspecified: Secondary | ICD-10-CM | POA: Diagnosis not present

## 2020-06-07 DIAGNOSIS — E119 Type 2 diabetes mellitus without complications: Secondary | ICD-10-CM

## 2020-06-07 DIAGNOSIS — Z7189 Other specified counseling: Secondary | ICD-10-CM

## 2020-06-07 DIAGNOSIS — Z8661 Personal history of infections of the central nervous system: Secondary | ICD-10-CM

## 2020-06-07 DIAGNOSIS — R569 Unspecified convulsions: Secondary | ICD-10-CM | POA: Diagnosis present

## 2020-06-07 DIAGNOSIS — Z79899 Other long term (current) drug therapy: Secondary | ICD-10-CM

## 2020-06-07 DIAGNOSIS — Z7902 Long term (current) use of antithrombotics/antiplatelets: Secondary | ICD-10-CM

## 2020-06-07 DIAGNOSIS — I10 Essential (primary) hypertension: Secondary | ICD-10-CM | POA: Diagnosis present

## 2020-06-07 DIAGNOSIS — Z20822 Contact with and (suspected) exposure to covid-19: Secondary | ICD-10-CM | POA: Diagnosis present

## 2020-06-07 DIAGNOSIS — G9340 Encephalopathy, unspecified: Secondary | ICD-10-CM | POA: Diagnosis present

## 2020-06-07 DIAGNOSIS — G0481 Other encephalitis and encephalomyelitis: Secondary | ICD-10-CM | POA: Diagnosis present

## 2020-06-07 DIAGNOSIS — E785 Hyperlipidemia, unspecified: Secondary | ICD-10-CM | POA: Diagnosis present

## 2020-06-07 DIAGNOSIS — E1165 Type 2 diabetes mellitus with hyperglycemia: Secondary | ICD-10-CM | POA: Diagnosis not present

## 2020-06-07 DIAGNOSIS — R4701 Aphasia: Secondary | ICD-10-CM | POA: Diagnosis present

## 2020-06-07 DIAGNOSIS — G9349 Other encephalopathy: Secondary | ICD-10-CM | POA: Diagnosis present

## 2020-06-07 DIAGNOSIS — Z91130 Patient's unintentional underdosing of medication regimen due to age-related debility: Secondary | ICD-10-CM

## 2020-06-07 DIAGNOSIS — F438 Other reactions to severe stress: Secondary | ICD-10-CM | POA: Diagnosis present

## 2020-06-07 DIAGNOSIS — T380X5A Adverse effect of glucocorticoids and synthetic analogues, initial encounter: Secondary | ICD-10-CM | POA: Diagnosis not present

## 2020-06-07 DIAGNOSIS — R29714 NIHSS score 14: Secondary | ICD-10-CM | POA: Diagnosis not present

## 2020-06-07 DIAGNOSIS — F05 Delirium due to known physiological condition: Secondary | ICD-10-CM | POA: Diagnosis present

## 2020-06-07 DIAGNOSIS — T50916A Underdosing of multiple unspecified drugs, medicaments and biological substances, initial encounter: Secondary | ICD-10-CM | POA: Diagnosis present

## 2020-06-07 DIAGNOSIS — Z515 Encounter for palliative care: Secondary | ICD-10-CM

## 2020-06-07 HISTORY — DX: Hyperlipidemia, unspecified: E78.5

## 2020-06-07 LAB — CBC WITH DIFFERENTIAL/PLATELET
Abs Immature Granulocytes: 0.03 10*3/uL (ref 0.00–0.07)
Basophils Absolute: 0 10*3/uL (ref 0.0–0.1)
Basophils Relative: 1 %
Eosinophils Absolute: 0 10*3/uL (ref 0.0–0.5)
Eosinophils Relative: 0 %
HCT: 38.2 % (ref 36.0–46.0)
Hemoglobin: 12.6 g/dL (ref 12.0–15.0)
Immature Granulocytes: 0 %
Lymphocytes Relative: 28 %
Lymphs Abs: 2.2 10*3/uL (ref 0.7–4.0)
MCH: 30 pg (ref 26.0–34.0)
MCHC: 33 g/dL (ref 30.0–36.0)
MCV: 91 fL (ref 80.0–100.0)
Monocytes Absolute: 0.6 10*3/uL (ref 0.1–1.0)
Monocytes Relative: 7 %
Neutro Abs: 4.9 10*3/uL (ref 1.7–7.7)
Neutrophils Relative %: 64 %
Platelets: 250 10*3/uL (ref 150–400)
RBC: 4.2 MIL/uL (ref 3.87–5.11)
RDW: 14.1 % (ref 11.5–15.5)
WBC: 7.7 10*3/uL (ref 4.0–10.5)
nRBC: 0 % (ref 0.0–0.2)

## 2020-06-07 LAB — COMPREHENSIVE METABOLIC PANEL
ALT: 20 U/L (ref 0–44)
AST: 40 U/L (ref 15–41)
Albumin: 3.7 g/dL (ref 3.5–5.0)
Alkaline Phosphatase: 73 U/L (ref 38–126)
Anion gap: 10 (ref 5–15)
BUN: 9 mg/dL (ref 8–23)
CO2: 27 mmol/L (ref 22–32)
Calcium: 9.9 mg/dL (ref 8.9–10.3)
Chloride: 102 mmol/L (ref 98–111)
Creatinine, Ser: 0.71 mg/dL (ref 0.44–1.00)
GFR, Estimated: >60 mL/min (ref >60)
Glucose, Bld: 166 mg/dL — ABNORMAL HIGH (ref 70–99)
Potassium: 3.8 mmol/L (ref 3.5–5.1)
Sodium: 139 mmol/L (ref 135–145)
Total Bilirubin: 0.9 mg/dL (ref 0.3–1.2)
Total Protein: 7.2 g/dL (ref 6.5–8.1)

## 2020-06-07 LAB — RESPIRATORY PANEL BY RT PCR (FLU A&B, COVID)
Influenza A by PCR: NEGATIVE
Influenza B by PCR: NEGATIVE
SARS Coronavirus 2 by RT PCR: NEGATIVE

## 2020-06-07 LAB — CBG MONITORING, ED
Glucose-Capillary: 147 mg/dL — ABNORMAL HIGH (ref 70–99)
Glucose-Capillary: 109 mg/dL — ABNORMAL HIGH (ref 70–99)

## 2020-06-07 LAB — TROPONIN I (HIGH SENSITIVITY)
Troponin I (High Sensitivity): 13 ng/L (ref <18)
Troponin I (High Sensitivity): 8 ng/L (ref <18)

## 2020-06-07 LAB — AMMONIA: Ammonia: 29 umol/L (ref 9–35)

## 2020-06-07 MED ORDER — LORAZEPAM 2 MG/ML IJ SOLN
2.0000 mg | Freq: Once | INTRAMUSCULAR | Status: DC
  Filled 2020-06-07: qty 1

## 2020-06-07 MED ORDER — LORAZEPAM 2 MG/ML IJ SOLN
2.0000 mg | Freq: Once | INTRAMUSCULAR | Status: AC
  Administered 2020-06-07: 2 mg via INTRAMUSCULAR

## 2020-06-07 MED ORDER — LEVETIRACETAM 500 MG PO TABS
500.0000 mg | ORAL_TABLET | Freq: Two times a day (BID) | ORAL | Status: DC
  Administered 2020-06-07: 500 mg via ORAL
  Filled 2020-06-07: qty 1

## 2020-06-07 MED ORDER — ACETAMINOPHEN 325 MG PO TABS: 650.0000 mg | ORAL_TABLET | Freq: Four times a day (QID) | ORAL | Status: DC | PRN

## 2020-06-07 MED ORDER — LEVETIRACETAM 500 MG PO TABS: 500.0000 mg | ORAL_TABLET | Freq: Two times a day (BID) | ORAL | Status: DC

## 2020-06-07 MED ORDER — POTASSIUM CHLORIDE IN NACL 20-0.9 MEQ/L-% IV SOLN
INTRAVENOUS | Status: AC
  Filled 2020-06-07 (×4): qty 1000

## 2020-06-07 MED ORDER — SODIUM CHLORIDE 0.9 % IV SOLN: INTRAVENOUS | Status: DC

## 2020-06-07 MED ORDER — HALOPERIDOL LACTATE 5 MG/ML IJ SOLN
5.0000 mg | Freq: Once | INTRAMUSCULAR | Status: AC
  Administered 2020-06-07: 5 mg via INTRAMUSCULAR
  Filled 2020-06-07: qty 1

## 2020-06-07 MED ORDER — ENOXAPARIN SODIUM 40 MG/0.4ML ~~LOC~~ SOLN
40.0000 mg | SUBCUTANEOUS | Status: DC
  Administered 2020-06-07 – 2020-06-15 (×9): 40 mg via SUBCUTANEOUS
  Filled 2020-06-07 (×9): qty 0.4

## 2020-06-07 MED ORDER — ONDANSETRON HCL 4 MG/2ML IJ SOLN: 4.0000 mg | Freq: Four times a day (QID) | INTRAMUSCULAR | Status: DC | PRN

## 2020-06-07 MED ORDER — ONDANSETRON HCL 4 MG PO TABS: 4.0000 mg | ORAL_TABLET | Freq: Four times a day (QID) | ORAL | Status: DC | PRN

## 2020-06-07 MED ORDER — ACETAMINOPHEN 650 MG RE SUPP: 650.0000 mg | Freq: Four times a day (QID) | RECTAL | Status: DC | PRN
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
## 2020-06-07 NOTE — ED Notes (Signed)
Attempted report x 2 

## 2020-06-07 NOTE — ED Notes (Signed)
Patient transported to CT 
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
## 2020-06-07 NOTE — ED Notes (Signed)
Pt keeps trying to get OOB and keeps taking all of her EKG leads off, bp cuff off and keeps trying to get dressed. Pt not redirectable. Dr. Maryan Rued notified and said to let pt walk around and it's okay for pt to take everything off.
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
## 2020-06-07 NOTE — ED Notes (Signed)
Pt in chair at nurses' station and keeps trying to get OOB. Pt was give wash cloths and coloring book, but still keeps trying to get [place] chair.

## 2020-06-07 NOTE — ED Notes (Signed)
Put pt in chair at nurses' station, because pt found wandering around ED.
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
## 2020-06-07 NOTE — Progress Notes (Cosign Needed)
Pt is not able to stay still, tech unable to perform test due to agitation.

## 2020-06-07 NOTE — ED Provider Notes (Signed)
[facility] EMERGENCY DEPARTMENT Provider Note   CSN: [id number] Arrival date & time: 06/07/20  1937     History Chief Complaint  Patient presents with  . Altered Mental Status    Sonya George is a 64 y.o. female.  Patient is a 65 year old female with a history of diabetes, hypertension, and stroke who is presenting today with EMS with a history of altered mental status.  Patient speaks Twi from [place] and does not speak [place].  Currently her daughter is not present to interpret.  Her daughter reported to EMS that patient has not been acting herself since she found out that her sister died.  Daughter reported that she has been acting bizarre, talking incoherently, pulling everything out of drawers and she turned on the sinks letting them overflow.  Daughter is currently not present and unclear how long she has been acting like this.  Patient is unable to respond to any questions but is looking around the room.  On patient's last hospital admission on 03/29/2020 she was found to have multiple acute/subacute infarcts with chronic hemorrhage in the frontal lobe.  She was to take a course of Plavix and aspirin and then continue on aspirin.  The MRA at that time was negative and carotid Doppler showed 39% stenosis on one side and the other side was clear.  TEE was negative.  At this time patient does not appear to have any trouble breathing and does not appear to be uncomfortable and blood sugar in route was normal.  The history is provided by the EMS personnel. The history is limited by a language barrier. Language interpreter used: no available interpreter for Twi.  Altered Mental Status Presenting symptoms: behavior changes        Past Medical History:  Diagnosis Date  . Diabetes mellitus without complication ([facility])   . Hypertension   . Stroke [facility])     Patient Active Problem List   Diagnosis Date Noted  . CVA (cerebral vascular accident) ([place]) 03/29/2020  .  Diabetes ([place]) 01/29/2019  . Essential hypertension 01/29/2019    Past Surgical History:  Procedure Laterality Date  . BUBBLE STUDY  04/01/2020   Procedure: BUBBLE STUDY;  Surgeon: Josue Hector, MD;  Location: [place] [facility];  Service: Cardiovascular;;  . TEE WITHOUT CARDIOVERSION N/A 04/01/2020   Procedure: TRANSESOPHAGEAL ECHOCARDIOGRAM (TEE);  Surgeon: Josue Hector, MD;  Location: [facility] ENDOSCOPY;  Service: Cardiovascular;  Laterality: N/A;     OB History   No obstetric history on file.     Family History  Problem Relation Age of Onset  . Stroke Sister        Actively dying of CVA  . Stroke Brother        Died 2 weeks ago of CVA    Social History   Tobacco Use  . Smoking status: Never Smoker  . Smokeless tobacco: Never Used  Vaping Use  . Vaping Use: Never used  Substance Use Topics  . Alcohol use: Never  . Drug use: Never    Home Medications Prior to Admission medications   Medication Sig Start Date End Date Taking? Authorizing Provider  amLODipine (NORVASC) 10 MG tablet Take 1 tablet (10 mg total) by mouth daily. 12/14/19   Maximiano Coss, NP  aspirin EC 81 MG EC tablet Take 1 tablet (81 mg total) by mouth daily. Swallow whole. 04/02/20   Geradine Girt, DO  atorvastatin (LIPITOR) 40 MG tablet Take 1 tablet (40 mg total) by mouth  daily. 04/02/20   Geradine Girt, DO  clopidogrel (PLAVIX) 75 MG tablet Take 1 tablet (75 mg total) by mouth daily. 04/02/20   Geradine Girt, DO  lisinopril (ZESTRIL) 10 MG tablet Take 1 tablet (10 mg total) by mouth daily. 12/14/19   Maximiano Coss, NP  metFORMIN (GLUCOPHAGE) 500 MG tablet Take 2 tablets (1,000 mg total) by mouth 2 (two) times daily with a meal. 12/14/19 06/11/20  Maximiano Coss, NP  Multiple Vitamins-Minerals (MULTIVITAMIN WITH MINERALS) tablet Take 1 tablet by mouth daily.    [provider]    Allergies    Patient has no known allergies.  Review of Systems   Review of Systems  Unable to perform ROS: Other  (language barrier and no family member present at this time.)    Physical Exam Updated Vital Signs BP 117/65   Pulse 83   Temp 98.2 F (36.8 C) (Oral)   Resp 14   Ht 5\' 6"  (1.676 m)   Wt 63.5 kg   SpO2 100%   BMI 22.60 kg/m   Physical Exam Vitals and nursing note reviewed.  Constitutional:      General: She is not in acute distress.    Appearance: She is well-developed and normal weight.  HENT:     Head: Normocephalic and atraumatic.     Nose: Nose normal.     Mouth/Throat:     Mouth: Mucous membranes are moist.  Eyes:     Extraocular Movements: Extraocular movements intact.     Conjunctiva/sclera: Conjunctivae normal.     Pupils: Pupils are equal, round, and reactive to light.  Neck:     Vascular: No carotid bruit.  Cardiovascular:     Rate and Rhythm: Normal rate and regular rhythm.     Pulses: Normal pulses.     Heart sounds: Normal heart sounds. No murmur heard.  No friction rub.  Pulmonary:     Effort: Pulmonary effort is normal.     Breath sounds: Normal breath sounds. No wheezing or rales.  Abdominal:     General: Bowel sounds are normal. There is no distension.     Palpations: Abdomen is soft.     Tenderness: There is no abdominal tenderness. There is no guarding or rebound.  Musculoskeletal:        General: No tenderness. Normal range of motion.     Cervical back: Normal range of motion and neck supple.     Right lower leg: No edema.     Left lower leg: No edema.     Comments: No edema  Lymphadenopathy:     Cervical: No cervical adenopathy.  Skin:    General: Skin is warm and dry.     Findings: No rash.  Neurological:     Mental Status: She is alert.     Comments: Awake and looking around.  Does not have a gaze preference.  Left arm noted to have mild rhythmic jerking that comes and goes.  Right arm within normal limits.  NO lip smacking or leg jerking.  Able to move all extremities but does not follow commands most likely related to language barrier.      ED Results / Procedures / Treatments   Labs (all labs ordered are listed, but only abnormal results are displayed) Labs Reviewed  CBG MONITORING, ED - Abnormal; Notable for the following components:      Result Value   Glucose-Capillary 147 (*)    All other components within normal limits  RESPIRATORY  PANEL BY RT PCR (FLU A&B, COVID)  CBC WITH DIFFERENTIAL/PLATELET  AMMONIA  COMPREHENSIVE METABOLIC PANEL  TROPONIN I (HIGH SENSITIVITY)  TROPONIN I (HIGH SENSITIVITY)    EKG EKG Interpretation  Date/Time:  Saturday June 07 2020 08:33:03 EDT Ventricular Rate:  84 PR Interval:    QRS Duration: 94 QT Interval:  362 QTC Calculation: 428 R Axis:     Text Interpretation: Sinus rhythm Consider left ventricular hypertrophy No significant change since last tracing Confirmed by Blanchie Dessert [phone]) on 06/07/2020 8:42:02 AM   Radiology CT HEAD WO CONTRAST  Result Date: 06/07/2020 CLINICAL DATA:  64 year old female with history of mental status change. EXAM: CT HEAD WITHOUT CONTRAST TECHNIQUE: Contiguous axial images were obtained from the base of the skull through the vertex without intravenous contrast. COMPARISON:  Head CT 03/29/2020. FINDINGS: Brain: Mild cerebral atrophy. Patchy and confluent areas of decreased attenuation are noted throughout the deep and periventricular white matter of the cerebral hemispheres bilaterally, compatible with chronic microvascular ischemic disease. In addition, there are more well-defined areas of low attenuation scattered in a patchy distribution throughout the left cerebral hemisphere, right occipital region, and left cerebellar hemisphere compatible with areas of encephalomalacia/gliosis from prior infarctions. No definitive evidence of new acute or subacute ischemia. No definite mass or mass effect. No hydrocephalus. Vascular: No hyperdense vessel or unexpected calcification. Skull: Normal. Negative for fracture or focal lesion.  Sinuses/Orbits: No acute finding. Other: None. IMPRESSION: 1. No acute intracranial abnormalities. 2. Mild cerebral atrophy with chronic microvascular ischemic changes and patchy areas of encephalomalacia/gliosis in the cerebrum and cerebellum, similar to prior examinations, as above. Electronically Signed   By: Vinnie Langton M.D.   On: 06/07/2020 10:03   DG Chest Port 1 View  Result Date: 06/07/2020 CLINICAL DATA:  Altered mental status, chest pain EXAM: PORTABLE CHEST 1 VIEW COMPARISON:  03/29/2020 chest radiograph. FINDINGS: Stable cardiomediastinal silhouette with mild cardiomegaly. No pneumothorax. No pleural effusion. Lungs appear clear, with no acute consolidative airspace disease and no pulmonary edema. IMPRESSION: Stable mild cardiomegaly without pulmonary edema. No active pulmonary disease. Electronically Signed   By: Ilona Sorrel M.D.   On: 06/07/2020 09:49    Procedures Procedures (including critical care time)  Medications Ordered in ED Medications - No data to display  ED Course  I have reviewed the triage vital signs and the nursing notes.  Pertinent labs & imaging results that were available during my care of the patient were reviewed by me and considered in my medical decision making (see chart for details).    MDM Rules/Calculators/A&P                          65 year old female presenting today with altered mental status.  Patient has significant history of stroke at the end of August with chronic hemorrhage in her frontal lobe on daily aspirin per last hospital record but no defined cause for the stroke other than hypertension and diabetes.  On exam patient is awake and alert but due to the language barrier and no family member present at this moment unclear how long her behavior has been going on.  Reported that it started after hearing about the death of a family member but unclear if that is been 4 days or hours.  Patient does have some mild rhythmic jerking of her  left arm and concern for possible partial complex seizure as she was acting bizarrely and turning on water and taking things out of  drawers.  Concern for further stroke.  Patient is not on any mind altering medication and has no history of liver or respiratory disease concerning for hyperammonemia or hypercarbia.  Vital signs are within normal limits.  There was no report from her daughter of infectious symptoms.  EKG without acute findings.  Head CT, chest x-ray and labs are pending. Will reevaluate when daughter arrives for any further history.  10:33 AM Was able to speak with patient's daughter Sonya George on the phone and she reports that she told her mother 2 days ago that her sister had died and she was appropriately sad and morning.  She reports that she would be very sad 1 minute and crying but other times would be calm.  She has not been eating or sleeping much but then she noticed last night that her mother was not acting normally.  She reports that she was turning on the water and letting it run out of the sink she was throwing things on the floor when she would try to talk with her she acted like she could not hear her which she reports is very different than any behavior she has displayed in the past.  Patient CT shows mild cerebral atrophy with chronic microvascular ischemic changes and patchy areas of encephalomalacia which is similar to prior.  Chest x-ray without acute findings, EKG unchanged, CBC and ammonia levels without acute findings.  On repeat evaluation here patient has no further twitching in the left arm and is now awake looking at me and attempting to communicate which is different from upon arrival.  Concerned that patient may be having atypical seizures.  Will discuss with neurology.  Final Clinical Impression(s) / ED Diagnoses Final diagnoses:  None    Rx / DC Orders ED Discharge Orders    None       Blanchie Dessert, MD 06/07/20 1551

## 2020-06-07 NOTE — ED Notes (Signed)
Pt given [place] bag and cup of ice water

## 2020-06-08 ENCOUNTER — Observation Stay (HOSPITAL_COMMUNITY): Payer: Medicaid Other

## 2020-06-08 DIAGNOSIS — R339 Retention of urine, unspecified: Secondary | ICD-10-CM | POA: Diagnosis not present

## 2020-06-08 DIAGNOSIS — G9340 Encephalopathy, unspecified: Secondary | ICD-10-CM

## 2020-06-08 DIAGNOSIS — R451 Restlessness and agitation: Secondary | ICD-10-CM | POA: Diagnosis present

## 2020-06-08 DIAGNOSIS — Z7902 Long term (current) use of antithrombotics/antiplatelets: Secondary | ICD-10-CM | POA: Diagnosis not present

## 2020-06-08 DIAGNOSIS — I69318 Other symptoms and signs involving cognitive functions following cerebral infarction: Secondary | ICD-10-CM | POA: Diagnosis not present

## 2020-06-08 DIAGNOSIS — I6340 Cerebral infarction due to embolism of unspecified cerebral artery: Secondary | ICD-10-CM | POA: Diagnosis present

## 2020-06-08 DIAGNOSIS — Z515 Encounter for palliative care: Secondary | ICD-10-CM | POA: Diagnosis not present

## 2020-06-08 DIAGNOSIS — Z8661 Personal history of infections of the central nervous system: Secondary | ICD-10-CM | POA: Diagnosis not present

## 2020-06-08 DIAGNOSIS — F0150 Vascular dementia without behavioral disturbance: Secondary | ICD-10-CM | POA: Diagnosis present

## 2020-06-08 DIAGNOSIS — E1165 Type 2 diabetes mellitus with hyperglycemia: Secondary | ICD-10-CM | POA: Diagnosis not present

## 2020-06-08 DIAGNOSIS — R4701 Aphasia: Secondary | ICD-10-CM | POA: Diagnosis present

## 2020-06-08 DIAGNOSIS — G0481 Other encephalitis and encephalomyelitis: Secondary | ICD-10-CM | POA: Diagnosis present

## 2020-06-08 DIAGNOSIS — Z20822 Contact with and (suspected) exposure to covid-19: Secondary | ICD-10-CM | POA: Diagnosis present

## 2020-06-08 DIAGNOSIS — D320 Benign neoplasm of cerebral meninges: Secondary | ICD-10-CM | POA: Diagnosis present

## 2020-06-08 DIAGNOSIS — G9389 Other specified disorders of brain: Secondary | ICD-10-CM | POA: Diagnosis present

## 2020-06-08 DIAGNOSIS — T50916A Underdosing of multiple unspecified drugs, medicaments and biological substances, initial encounter: Secondary | ICD-10-CM | POA: Diagnosis present

## 2020-06-08 DIAGNOSIS — E785 Hyperlipidemia, unspecified: Secondary | ICD-10-CM

## 2020-06-08 DIAGNOSIS — Z91130 Patient's unintentional underdosing of medication regimen due to age-related debility: Secondary | ICD-10-CM | POA: Diagnosis not present

## 2020-06-08 DIAGNOSIS — F05 Delirium due to known physiological condition: Secondary | ICD-10-CM | POA: Diagnosis present

## 2020-06-08 DIAGNOSIS — I10 Essential (primary) hypertension: Secondary | ICD-10-CM | POA: Diagnosis present

## 2020-06-08 DIAGNOSIS — R569 Unspecified convulsions: Secondary | ICD-10-CM | POA: Diagnosis present

## 2020-06-08 DIAGNOSIS — Z7982 Long term (current) use of aspirin: Secondary | ICD-10-CM | POA: Diagnosis not present

## 2020-06-08 DIAGNOSIS — Z823 Family history of stroke: Secondary | ICD-10-CM | POA: Diagnosis not present

## 2020-06-08 DIAGNOSIS — R29714 NIHSS score 14: Secondary | ICD-10-CM | POA: Diagnosis not present

## 2020-06-08 DIAGNOSIS — F0151 Vascular dementia with behavioral disturbance: Secondary | ICD-10-CM | POA: Diagnosis present

## 2020-06-08 DIAGNOSIS — G9349 Other encephalopathy: Secondary | ICD-10-CM | POA: Diagnosis present

## 2020-06-08 LAB — URINALYSIS, ROUTINE W REFLEX MICROSCOPIC
Bilirubin Urine: NEGATIVE
Glucose, UA: 100 mg/dL — AB
Hgb urine dipstick: NEGATIVE
Ketones, ur: 15 mg/dL — AB
Leukocytes,Ua: NEGATIVE
Nitrite: NEGATIVE
Protein, ur: NEGATIVE mg/dL
Specific Gravity, Urine: 1.015 (ref 1.005–1.030)
pH: 8 (ref 5.0–8.0)

## 2020-06-08 LAB — GLUCOSE, CAPILLARY
Glucose-Capillary: 152 mg/dL — ABNORMAL HIGH (ref 70–99)
Glucose-Capillary: 221 mg/dL — ABNORMAL HIGH (ref 70–99)

## 2020-06-08 LAB — RAPID URINE DRUG SCREEN, HOSP PERFORMED
Amphetamines: NOT DETECTED
Barbiturates: NOT DETECTED
Benzodiazepines: POSITIVE — AB
Cocaine: NOT DETECTED
Opiates: NOT DETECTED
Tetrahydrocannabinol: NOT DETECTED

## 2020-06-08 LAB — AMMONIA: Ammonia: 17 umol/L (ref 9–35)

## 2020-06-08 LAB — HIV ANTIBODY (ROUTINE TESTING W REFLEX): HIV Screen 4th Generation wRfx: NONREACTIVE

## 2020-06-08 LAB — TSH: TSH: 0.937 u[IU]/mL (ref 0.350–4.500)

## 2020-06-08 LAB — PHOSPHORUS: Phosphorus: 2.5 mg/dL (ref 2.5–4.6)

## 2020-06-08 LAB — VITAMIN B12: Vitamin B-12: 561 pg/mL (ref 180–914)

## 2020-06-08 LAB — MAGNESIUM: Magnesium: 1.8 mg/dL (ref 1.7–2.4)

## 2020-06-08 MED ORDER — HALOPERIDOL LACTATE 5 MG/ML IJ SOLN
2.0000 mg | Freq: Four times a day (QID) | INTRAMUSCULAR | Status: DC | PRN
  Administered 2020-06-08 – 2020-06-10 (×4): 2 mg via INTRAMUSCULAR
  Filled 2020-06-08 (×4): qty 1

## 2020-06-08 MED ORDER — LORAZEPAM 2 MG/ML IJ SOLN
1.0000 mg | Freq: Once | INTRAMUSCULAR | Status: AC
  Administered 2020-06-08: 1 mg via INTRAVENOUS

## 2020-06-08 MED ORDER — QUETIAPINE FUMARATE 25 MG PO TABS
25.0000 mg | ORAL_TABLET | Freq: Every day | ORAL | Status: DC
  Administered 2020-06-09 – 2020-06-13 (×4): 25 mg via ORAL
  Filled 2020-06-08 (×5): qty 1

## 2020-06-08 MED ORDER — LORAZEPAM 2 MG/ML IJ SOLN
1.0000 mg | INTRAMUSCULAR | Status: DC | PRN
  Administered 2020-06-08 – 2020-06-14 (×8): 1 mg via INTRAVENOUS
  Filled 2020-06-08 (×11): qty 1

## 2020-06-08 NOTE — Progress Notes (Addendum)
PROGRESS NOTE    Sonya George  KZS:[id number] DOB: 1956/04/09 DOA: 06/07/2020 PCP: Maximiano Coss, NP    Chief Complaint  Patient presents with  . Altered Mental Status    Brief Narrative:   64 year old lady with prior history of hypertension, diabetes recent history of multiple strokes in August 2021 presents to ED with confusion, delirious and agitation. Patient speaks Twi dialect from [place], could not get any history from the patient at this time CT head showed Mild cerebral atrophy with chronic microvascular ischemic changes and patchy areas of encephalomalacia/gliosis in the cerebrum and cerebellum. Coronavirus and influenza PCR is negative.  Neurology consulted recommended EEG and will follow.  Assessment & Plan:   Principal Problem:   Acute encephalopathy Active Problems:   Type 2 diabetes mellitus (HCC)   Essential hypertension   Seizure (HCC)   Hyperlipidemia   Acute encephalopathy probably secondary to focal seizures in the setting of encephalomalacia from prior left MCA territory stroke versus behavioral abnormalities from acute psychosis vs delirium.  EEG done this am suggestive of mild diffuse encephalopathy, nonspecific in etiology.  No seizures or epileptiform discharges were seen throughout the reading. She was given Keppra initially but discontinued due to worsening of her behavioral abnormalities. As needed Haldol, IV Ativan ordered.  When she is more stable and calms down she will probably need psychiatric evaluation. Seroquel 25 mg ordered for bedtime. TSH, vitamin B12 within normal limits Urine drug screen is positive for benzodiazepines. Urine analysis and chest x-ray is negative for infection.  She remains afebrile and WBC count within normal limits.   Type 2 diabetes mellitus CBG (last 3)  Recent Labs    06/07/20 0846 06/07/20 2209 06/08/20 1135  GLUCAP 147* 109* 152*   Continue sliding scale insulin for now.    History of recent left  MCA territory stroke: As per the daughter patient is noncompliant to meds, occasionally confused and spits out the meds. Currently all her meds on hold as patient is confused and would not take anything orally.   Essential hypertension Blood pressure parameters appear to be optimal at this time.     DVT prophylaxis: LOVENOX.  Code Status: full code.  Family Communication: (Discussed with daughter over the phone,  who will visit the patient later today.) Disposition:   Status is: Observation  The patient will require care spanning > 2 midnights and should be moved to inpatient because: Ongoing diagnostic testing needed not appropriate for outpatient work up, Unsafe d/c plan, IV treatments appropriate due to intensity of illness or inability to take PO and Inpatient level of care appropriate due to severity of illness, pt confused , agitated, in restraints. Further work up needed.   Dispo: The patient is from: Home              Anticipated d/c is to: pending.               Anticipated d/c date is: 2 days              Patient currently is not medically stable to d/c.       Consultants:   Neurology.    Procedures: EEG   Antimicrobials: none.   Subjective: Pt confused and agitated.   Objective: Vitals:   06/08/20 0026 06/08/20 0527 06/08/20 0848 06/08/20 1138  BP: (!) 115/53 (!) 136/94 (!) 142/89 135/83  Pulse: 81 81 85 92  Resp: 18 16 18 18   Temp:  98 F (36.7 C) 98.2 F (36.8 C)  98.2 F (36.8 C)  TempSrc:  Axillary    SpO2: 100% 100% 100% 100%  Weight:      Height:        Intake/Output Summary (Last 24 hours) at 06/08/2020 1316 Last data filed at 06/08/2020 0600 Gross per 24 hour  Intake 832.11 ml  Output --  Net 832.11 ml   Filed Weights   06/07/20 0840  Weight: 63.5 kg    Examination:  General exam: Appears calm and comfortable  Respiratory system: Clear to auscultation. Respiratory effort normal. Cardiovascular system: S1 & S2 heard,. No JVD,  No  pedal edema. Gastrointestinal system: Abdomen is nondistended, soft and nontender.Normal bowel sounds heard. Central nervous system: confused, agitated, delirious.  Extremities: Symmetric 5 x 5 power. Skin: No rashes, lesions or ulcers Psychiatry: cannot be assessed.     Data Reviewed: I have personally reviewed following labs and imaging studies  CBC: Recent Labs  Lab 06/07/20 0901  WBC 7.7  NEUTROABS 4.9  HGB 12.6  HCT 38.2  MCV 91.0  PLT 740    Basic Metabolic Panel: Recent Labs  Lab 06/07/20 0901  NA 139  K 3.8  CL 102  CO2 27  GLUCOSE 166*  BUN 9  CREATININE 0.71  CALCIUM 9.9    GFR: Estimated Creatinine Clearance: 65.6 mL/min (by C-G formula based on SCr of 0.71 mg/dL).  Liver Function Tests: Recent Labs  Lab 06/07/20 0901  AST 40  ALT 20  ALKPHOS 73  BILITOT 0.9  PROT 7.2  ALBUMIN 3.7    CBG: Recent Labs  Lab 06/07/20 0846 06/07/20 2209 06/08/20 1135  GLUCAP 147* 109* 152*     Recent Results (from the past 240 hour(s))  Respiratory Panel by RT PCR (Flu A&B, Covid) - Nasopharyngeal Swab     Status: None   Collection Time: 06/07/20 10:39 AM   Specimen: Nasopharyngeal Swab  Result Value Ref Range Status   SARS Coronavirus 2 by RT PCR NEGATIVE NEGATIVE Final    Comment: (NOTE) SARS-CoV-2 target nucleic acids are NOT DETECTED.  The SARS-CoV-2 RNA is generally detectable in upper respiratoy specimens during the acute phase of infection. The lowest concentration of SARS-CoV-2 viral copies this assay can detect is 131 copies/mL. A negative result does not preclude SARS-Cov-2 infection and should not be used as the sole basis for treatment or other patient management decisions. A negative result may occur with  improper specimen collection/handling, submission of specimen other than nasopharyngeal swab, presence of viral mutation(s) within the areas targeted by this assay, and inadequate number of viral copies (<131 copies/mL). A negative  result must be combined with clinical observations, patient history, and epidemiological information. The expected result is Negative.  Fact Sheet for Patients:  PinkCheek.be  Fact Sheet for Healthcare Providers:  GravelBags.it  This test is no t yet approved or cleared by the [place] FDA and  has been authorized for detection and/or diagnosis of SARS-CoV-2 by FDA under an Emergency Use Authorization (EUA). This EUA will remain  in effect (meaning this test can be used) for the duration of the COVID-19 declaration under Section 564(b)(1) of the Act, 21 U.S.C. section 360bbb-3(b)(1), unless the authorization is terminated or revoked sooner.     Influenza A by PCR NEGATIVE NEGATIVE Final   Influenza B by PCR NEGATIVE NEGATIVE Final    Comment: (NOTE) The Xpert Xpress SARS-CoV-2/FLU/RSV assay is intended as an aid in  the diagnosis of influenza from Nasopharyngeal swab specimens and  should not be used  as a sole basis for treatment. Nasal washings and  aspirates are unacceptable for Xpert Xpress SARS-CoV-2/FLU/RSV  testing.  Fact Sheet for Patients: PinkCheek.be  Fact Sheet for Healthcare Providers: GravelBags.it  This test is not yet approved or cleared by the [place] FDA and  has been authorized for detection and/or diagnosis of SARS-CoV-2 by  FDA under an Emergency Use Authorization (EUA). This EUA will remain  in effect (meaning this test can be used) for the duration of the  Covid-19 declaration under Section 564(b)(1) of the Act, 21  U.S.C. section 360bbb-3(b)(1), unless the authorization is  terminated or revoked. Performed at [facility], [place] [address] [place], [place] [address]          Radiology Studies: CT HEAD WO CONTRAST  Result Date: 06/07/2020 CLINICAL DATA:  64 year old female with history of mental status change.  EXAM: CT HEAD WITHOUT CONTRAST TECHNIQUE: Contiguous axial images were obtained from the base of the skull through the vertex without intravenous contrast. COMPARISON:  Head CT 03/29/2020. FINDINGS: Brain: Mild cerebral atrophy. Patchy and confluent areas of decreased attenuation are noted throughout the deep and periventricular white matter of the cerebral hemispheres bilaterally, compatible with chronic microvascular ischemic disease. In addition, there are more well-defined areas of low attenuation scattered in a patchy distribution throughout the left cerebral hemisphere, right occipital region, and left cerebellar hemisphere compatible with areas of encephalomalacia/gliosis from prior infarctions. No definitive evidence of new acute or subacute ischemia. No definite mass or mass effect. No hydrocephalus. Vascular: No hyperdense vessel or unexpected calcification. Skull: Normal. Negative for fracture or focal lesion. Sinuses/Orbits: No acute finding. Other: None. IMPRESSION: 1. No acute intracranial abnormalities. 2. Mild cerebral atrophy with chronic microvascular ischemic changes and patchy areas of encephalomalacia/gliosis in the cerebrum and cerebellum, similar to prior examinations, as above. Electronically Signed   By: Vinnie Langton M.D.   On: 06/07/2020 10:03   DG Chest Port 1 View  Result Date: 06/07/2020 CLINICAL DATA:  Altered mental status, chest pain EXAM: PORTABLE CHEST 1 VIEW COMPARISON:  03/29/2020 chest radiograph. FINDINGS: Stable cardiomediastinal silhouette with mild cardiomegaly. No pneumothorax. No pleural effusion. Lungs appear clear, with no acute consolidative airspace disease and no pulmonary edema. IMPRESSION: Stable mild cardiomegaly without pulmonary edema. No active pulmonary disease. Electronically Signed   By: Ilona Sorrel M.D.   On: 06/07/2020 09:49   EEG adult  Result Date: 06/08/2020 Lora Havens, MD     06/08/2020 12:56 PM Patient Name: Laria Grimmett MRN:  [id number] Epilepsy Attending: Lora Havens Referring Physician/Provider: Claiborne Billings, PA Date: 06/08/2020 Duration: 25.15 mins Patient history: 64yo F with ams. EEG to evaluate for seizure. Level of alertness: Awake AEDs during EEG study: Ativan Technical aspects: This EEG study was done with scalp electrodes positioned according to the 10-20 International system of electrode placement. Electrical activity was acquired at a sampling rate of 500Hz  and reviewed with a high frequency filter of 70Hz  and a low frequency filter of 1Hz . EEG data were recorded continuously and digitally stored. Description: The posterior dominant rhythm consists of 8 Hz activity of moderate voltage (25-35 uV) seen predominantly in posterior head regions, symmetric and reactive to eye opening and eye closing. EEG showed continuous generalized 3 to 6 Hz theta-delta slowing admixed with 15-18Hz  beta activity in frontocentral regions.   Hyperventilation and photic stimulation were not performed.   ABNORMALITY -Continuous slow, generalized IMPRESSION: This study is suggestive of mild diffuse encephalopathy, nonspecific etiology. No seizures or epileptiform  discharges were seen throughout the recording. Priyanka Barbra Sarks        Scheduled Meds: . enoxaparin (LOVENOX) injection  40 mg Subcutaneous Q24H  . QUEtiapine  25 mg Oral QHS   Continuous Infusions: . 0.9 % NaCl with KCl 20 mEq / L 100 mL/hr at 06/08/20 0916     LOS: 0 days        Hosie Poisson, MD [facility]   To contact the attending provider between 7A-7P or the covering provider during after hours 7P-7A, please log into the web site [email or web address] and access using universal [place] password for that web site. If you do not have the password, please call the hospital operator.  06/08/2020, 1:16 PM

## 2020-06-08 NOTE — Progress Notes (Addendum)
Neurology Progress Note  Patient ID: Sonya George is a 64 y.o. with PMHx of  has a past medical history of Diabetes mellitus without complication ([place]), Hyperlipidemia (06/07/2020), Hypertension, and Stroke ([place]).  Initially consulted for: twitching at left hand   Major interval events:  Combative throughout hospitalization Sedated with haldol for EEG: negative  Subjective:  unable to communicate  Exam: Vitals:   06/08/20 0848 06/08/20 1138  BP: (!) 142/89 135/83  Pulse: 85 92  Resp: 18 18  Temp: 98.2 F (36.8 C) 98.2 F (36.8 C)  SpO2: 100% 100%   Gen: In bed, comfortable  Resp: non-labored breathing, no grossly audible wheezing Cardiac: Perfusing extremities well  Abd: soft, nt  Neuro: MS: lethargic secondary to haldol administeration.  XE:NMMHWKGSU, moans to verbal stimuli but not fully responsive. Eyes open an close spontaneously Moves all extremities.  sensate throughout    PJS:RPRXYVOP not tested   Pertinent Labs: ua negative EEG: mild diffuse encephalopathy, nonspecific etiology. No seiaures seen.     Impression:   64 year old black female admitted with altered mental status. Neuroimaging and EEG unremarkable.  Suspect her recent decompensation is more likely a stress reaction to her sister's passing.  Appreciate our psychiatry colleagues in assessment and management of her agitation.  While there was a single episode of possible left hand twitching, this does not correlate well to either of the potential epileptogenic lesions on her CNS imaging (her meningioma would be more likely to cause focal leg twitching right more than left, and her chronic left MCA territory infarcts would be expected to cause focal right sided symptoms).  She has not had any recurrent episodes of this type of twitching.  Therefore these imaging data coupled with the EEG with only diffuse encephalopathy suggest to me she is not having ongoing seizure activity.  However should there be  further concern for potential ictal activity, neurology is happy to reevaluate.  Recommendations: - no further neuro workup indicated at this time - No indication for an antiseizure medication at this time - Recommend outpatient follow up with neurologist as previously presribed We remain available to assist.   Lesleigh Noe MD-PhD [facility] [phone]

## 2020-06-08 NOTE — Progress Notes (Signed)
Stat  EEG complete - results pending.  

## 2020-06-08 NOTE — Procedures (Signed)
Patient Name: Sonya George  MRN: [id number]  Epilepsy Attending: Lora Havens  Referring Physician/Provider: Claiborne Billings, PA Date: 06/08/2020 Duration: 25.15 mins  Patient history: 64yo F with ams. EEG to evaluate for seizure.  Level of alertness: Awake  AEDs during EEG study: Ativan  Technical aspects: This EEG study was done with scalp electrodes positioned according to the 10-20 International system of electrode placement. Electrical activity was acquired at a sampling rate of 500Hz  and reviewed with a high frequency filter of 70Hz  and a low frequency filter of 1Hz . EEG data were recorded continuously and digitally stored.   Description: The posterior dominant rhythm consists of 8 Hz activity of moderate voltage (25-35 uV) seen predominantly in posterior head regions, symmetric and reactive to eye opening and eye closing. EEG showed continuous generalized 3 to 6 Hz theta-delta slowing admixed with 15-18Hz  beta activity in frontocentral regions.   Hyperventilation and photic stimulation were not performed.     ABNORMALITY -Continuous slow, generalized  IMPRESSION: This study is suggestive of mild diffuse encephalopathy, nonspecific etiology. No seizures or epileptiform discharges were seen throughout the recording.   Josafat Enrico Barbra Sarks

## 2020-06-09 LAB — COMPREHENSIVE METABOLIC PANEL
ALT: 26 U/L (ref 0–44)
AST: 48 U/L — ABNORMAL HIGH (ref 15–41)
Albumin: 3.6 g/dL (ref 3.5–5.0)
Alkaline Phosphatase: 73 U/L (ref 38–126)
Anion gap: 11 (ref 5–15)
BUN: 5 mg/dL — ABNORMAL LOW (ref 8–23)
CO2: 24 mmol/L (ref 22–32)
Calcium: 9.8 mg/dL (ref 8.9–10.3)
Chloride: 102 mmol/L (ref 98–111)
Creatinine, Ser: 0.67 mg/dL (ref 0.44–1.00)
GFR, Estimated: >60 mL/min (ref >60)
Glucose, Bld: 244 mg/dL — ABNORMAL HIGH (ref 70–99)
Potassium: 3.9 mmol/L (ref 3.5–5.1)
Sodium: 137 mmol/L (ref 135–145)
Total Bilirubin: 1 mg/dL (ref 0.3–1.2)
Total Protein: 7.3 g/dL (ref 6.5–8.1)

## 2020-06-09 LAB — CBC
HCT: 41.4 % (ref 36.0–46.0)
Hemoglobin: 14.1 g/dL (ref 12.0–15.0)
MCH: 29.9 pg (ref 26.0–34.0)
MCHC: 34.1 g/dL (ref 30.0–36.0)
MCV: 87.9 fL (ref 80.0–100.0)
Platelets: 319 10*3/uL (ref 150–400)
RBC: 4.71 MIL/uL (ref 3.87–5.11)
RDW: 13.8 % (ref 11.5–15.5)
WBC: 9.5 10*3/uL (ref 4.0–10.5)
nRBC: 0 % (ref 0.0–0.2)

## 2020-06-09 LAB — GLUCOSE, CAPILLARY
Glucose-Capillary: 246 mg/dL — ABNORMAL HIGH (ref 70–99)
Glucose-Capillary: 237 mg/dL — ABNORMAL HIGH (ref 70–99)
Glucose-Capillary: 198 mg/dL — ABNORMAL HIGH (ref 70–99)

## 2020-06-09 LAB — HEMOGLOBIN A1C
Hgb A1c MFr Bld: 6.9 % — ABNORMAL HIGH (ref 4.8–5.6)
Mean Plasma Glucose: 151.33 mg/dL

## 2020-06-09 LAB — RPR: RPR Ser Ql: NONREACTIVE

## 2020-06-09 MED ORDER — INSULIN ASPART 100 UNIT/ML ~~LOC~~ SOLN
0.0000 [IU] | Freq: Three times a day (TID) | SUBCUTANEOUS | Status: DC
  Administered 2020-06-09: 3 [IU] via SUBCUTANEOUS
  Administered 2020-06-10 (×2): 2 [IU] via SUBCUTANEOUS
  Administered 2020-06-10 – 2020-06-11 (×2): 3 [IU] via SUBCUTANEOUS
  Administered 2020-06-11: 5 [IU] via SUBCUTANEOUS
  Administered 2020-06-11: 1 [IU] via SUBCUTANEOUS
  Administered 2020-06-12: 3 [IU] via SUBCUTANEOUS
  Administered 2020-06-12: 2 [IU] via SUBCUTANEOUS
  Administered 2020-06-12: 3 [IU] via SUBCUTANEOUS
  Administered 2020-06-13 (×2): 2 [IU] via SUBCUTANEOUS
  Administered 2020-06-13: 5 [IU] via SUBCUTANEOUS
  Administered 2020-06-14: 2 [IU] via SUBCUTANEOUS
  Administered 2020-06-14: 1 [IU] via SUBCUTANEOUS
  Administered 2020-06-14 – 2020-06-15 (×2): 3 [IU] via SUBCUTANEOUS
  Administered 2020-06-15: 2 [IU] via SUBCUTANEOUS
  Administered 2020-06-16: 1 [IU] via SUBCUTANEOUS
  Administered 2020-06-16 – 2020-06-17 (×4): 2 [IU] via SUBCUTANEOUS
  Administered 2020-06-17: 3 [IU] via SUBCUTANEOUS
  Administered 2020-06-18: 1 [IU] via SUBCUTANEOUS
  Administered 2020-06-18 (×2): 2 [IU] via SUBCUTANEOUS

## 2020-06-09 MED ORDER — DIPHENHYDRAMINE HCL 12.5 MG/5ML PO ELIX
6.2500 mg | ORAL_SOLUTION | Freq: Four times a day (QID) | ORAL | Status: DC | PRN
  Administered 2020-06-09: 6.25 mg via ORAL
  Filled 2020-06-09: qty 10

## 2020-06-09 NOTE — Plan of Care (Signed)

## 2020-06-09 NOTE — Progress Notes (Signed)
PROGRESS NOTE    Sonya George  ZGY:[id number] DOB: 1956-03-17 DOA: 06/07/2020 PCP: Maximiano Coss, NP    Chief Complaint  Patient presents with  . Altered Mental Status    Brief Narrative:   64 year old lady with prior history of hypertension, diabetes recent history of multiple strokes in August 2021 presents to ED with confusion, delirious and agitation. Patient speaks Twi dialect from [place], could not get any history from the patient at this time CT head showed Mild cerebral atrophy with chronic microvascular ischemic changes and patchy areas of encephalomalacia/gliosis in the cerebrum and cerebellum. Coronavirus and influenza PCR is negative.  Neurology consulted, suggested that recent decompensation is more likely a stress reaction to her sister's passing and recommended psychiatry consult.  Pt seen and examined at bedside. She is more calm today but still very confused.   Assessment & Plan:   Principal Problem:   Acute encephalopathy Active Problems:   Type 2 diabetes mellitus (HCC)   Essential hypertension   Seizure (HCC)   Hyperlipidemia   Acute encephalopathy probably secondary to focal seizures in the setting of encephalomalacia from prior left MCA territory stroke versus behavioral abnormalities from acute psychosis vs delirium.  EEG done this am suggestive of mild diffuse encephalopathy, nonspecific in etiology.  No seizures or epileptiform discharges were seen throughout the reading. She was given Keppra initially but discontinued due to worsening of her behavioral abnormalities. As needed Haldol, IV Ativan ordered.  When she is more stable and calms down she will probably need psychiatric evaluation. Seroquel 25 mg ordered for bedtime. TSH, vitamin B12 within normal limits Urine drug screen is positive for benzodiazepines. Urine analysis and chest x-ray is negative for infection.  She remains afebrile and WBC count within normal limits. Neurology on board  suggested that recent decompensation is more likely a stress reaction to her sister's passing and recommended psychiatry consult.    Type 2 diabetes mellitus CBG (last 3)  Recent Labs    06/08/20 1135 06/08/20 2135 06/09/20 0609  GLUCAP 152* 221* 246*   Continue sliding scale insulin for now. No changes in meds.     History of recent left MCA territory stroke: As per the daughter patient is noncompliant to meds, occasionally confused and spits out the meds. Currently all her meds on hold as patient is confused and would not take anything orally. SLP evaluation ordered.    Essential hypertension BP parameters are elevated. Prn labetalol ordered.      DVT prophylaxis: LOVENOX.  Code Status: full code.  Family Communication: None at bedside.  Disposition:   Status is: Observation  The patient will require care spanning > 2 midnights and should be moved to inpatient because: Ongoing diagnostic testing needed not appropriate for outpatient work up, Unsafe d/c plan, IV treatments appropriate due to intensity of illness or inability to take PO and Inpatient level of care appropriate due to severity of illness, pt confused , agitated, in restraints. Further work up needed.   Dispo: The patient is from: Home              Anticipated d/c is to: pending.               Anticipated d/c date is: 2 days              Patient currently is not medically stable to d/c.       Consultants:   Neurology.    Procedures: EEG   Antimicrobials: none.   Subjective: Sleepy,  mumbling in her language. Confused.   Objective: Vitals:   06/08/20 2338 06/09/20 0339 06/09/20 0949 06/09/20 1118  BP: 109/74 138/78 140/80 (!) 162/97  Pulse: 87 95 100 (!) 106  Resp: 18 18 17 18   Temp: 97.7 F (36.5 C) 97.7 F (36.5 C) 98.8 F (37.1 C) 98.9 F (37.2 C)  TempSrc: Axillary Oral Axillary Axillary  SpO2: 100% 100% 100% 100%  Weight:      Height:        Intake/Output Summary (Last 24  hours) at 06/09/2020 1223 Last data filed at 06/08/2020 2000 Gross per 24 hour  Intake 1446.03 ml  Output --  Net 1446.03 ml   Filed Weights   06/07/20 0840  Weight: 63.5 kg    Examination:  General exam: Alert and comfortable, not in distress Respiratory system: Clear to auscultation bilaterally no wheezing or rhonchi Cardiovascular system: S1-S2 heard, no JVD no pedal edema Gastrointestinal system: Abdomen is soft, nontender bowel sounds normal Central nervous system: Patient alert but confused Extremities: No pedal edema Skin: No rashes seen Psychiatry: Cannot be assessed    Data Reviewed: I have personally reviewed following labs and imaging studies  CBC: Recent Labs  Lab 06/07/20 0901  WBC 7.7  NEUTROABS 4.9  HGB 12.6  HCT 38.2  MCV 91.0  PLT 591    Basic Metabolic Panel: Recent Labs  Lab 06/07/20 0901 06/08/20 1302  NA 139  --   K 3.8  --   CL 102  --   CO2 27  --   GLUCOSE 166*  --   BUN 9  --   CREATININE 0.71  --   CALCIUM 9.9  --   MG  --  1.8  PHOS  --  2.5    GFR: Estimated Creatinine Clearance: 65.6 mL/min (by C-G formula based on SCr of 0.71 mg/dL).  Liver Function Tests: Recent Labs  Lab 06/07/20 0901  AST 40  ALT 20  ALKPHOS 73  BILITOT 0.9  PROT 7.2  ALBUMIN 3.7    CBG: Recent Labs  Lab 06/07/20 0846 06/07/20 2209 06/08/20 1135 06/08/20 2135 06/09/20 0609  GLUCAP 147* 109* 152* 221* 246*     Recent Results (from the past 240 hour(s))  Respiratory Panel by RT PCR (Flu A&B, Covid) - Nasopharyngeal Swab     Status: None   Collection Time: 06/07/20 10:39 AM   Specimen: Nasopharyngeal Swab  Result Value Ref Range Status   SARS Coronavirus 2 by RT PCR NEGATIVE NEGATIVE Final    Comment: (NOTE) SARS-CoV-2 target nucleic acids are NOT DETECTED.  The SARS-CoV-2 RNA is generally detectable in upper respiratoy specimens during the acute phase of infection. The lowest concentration of SARS-CoV-2 viral copies this assay  can detect is 131 copies/mL. A negative result does not preclude SARS-Cov-2 infection and should not be used as the sole basis for treatment or other patient management decisions. A negative result may occur with  improper specimen collection/handling, submission of specimen other than nasopharyngeal swab, presence of viral mutation(s) within the areas targeted by this assay, and inadequate number of viral copies (<131 copies/mL). A negative result must be combined with clinical observations, patient history, and epidemiological information. The expected result is Negative.  Fact Sheet for Patients:  PinkCheek.be  Fact Sheet for Healthcare Providers:  GravelBags.it  This test is no t yet approved or cleared by the [place] FDA and  has been authorized for detection and/or diagnosis of SARS-CoV-2 by FDA under an Emergency Use Authorization (  EUA). This EUA will remain  in effect (meaning this test can be used) for the duration of the COVID-19 declaration under Section 564(b)(1) of the Act, 21 U.S.C. section 360bbb-3(b)(1), unless the authorization is terminated or revoked sooner.     Influenza A by PCR NEGATIVE NEGATIVE Final   Influenza B by PCR NEGATIVE NEGATIVE Final    Comment: (NOTE) The Xpert Xpress SARS-CoV-2/FLU/RSV assay is intended as an aid in  the diagnosis of influenza from Nasopharyngeal swab specimens and  should not be used as a sole basis for treatment. Nasal washings and  aspirates are unacceptable for Xpert Xpress SARS-CoV-2/FLU/RSV  testing.  Fact Sheet for Patients: PinkCheek.be  Fact Sheet for Healthcare Providers: GravelBags.it  This test is not yet approved or cleared by the [place] FDA and  has been authorized for detection and/or diagnosis of SARS-CoV-2 by  FDA under an Emergency Use Authorization (EUA). This EUA will remain    in effect (meaning this test can be used) for the duration of the  Covid-19 declaration under Section 564(b)(1) of the Act, 21  U.S.C. section 360bbb-3(b)(1), unless the authorization is  terminated or revoked. Performed at [facility], [place] [address] [place], [place] [address]          Radiology Studies: EEG adult  Result Date: 07-06-2020 Lora Havens, MD     07/06/2020 12:56 PM Patient Name: Sonya George MRN: [id number] Epilepsy Attending: Lora Havens Referring Physician/Provider: Claiborne Billings, PA Date: 2020/07/06 Duration: 25.15 mins Patient history: 64yo F with ams. EEG to evaluate for seizure. Level of alertness: Awake AEDs during EEG study: Ativan Technical aspects: This EEG study was done with scalp electrodes positioned according to the 10-20 International system of electrode placement. Electrical activity was acquired at a sampling rate of 500Hz  and reviewed with a high frequency filter of 70Hz  and a low frequency filter of 1Hz . EEG data were recorded continuously and digitally stored. Description: The posterior dominant rhythm consists of 8 Hz activity of moderate voltage (25-35 uV) seen predominantly in posterior head regions, symmetric and reactive to eye opening and eye closing. EEG showed continuous generalized 3 to 6 Hz theta-delta slowing admixed with 15-18Hz  beta activity in frontocentral regions.   Hyperventilation and photic stimulation were not performed.   ABNORMALITY -Continuous slow, generalized IMPRESSION: This study is suggestive of mild diffuse encephalopathy, nonspecific etiology. No seizures or epileptiform discharges were seen throughout the recording. Priyanka Barbra Sarks        Scheduled Meds: . enoxaparin (LOVENOX) injection  40 mg Subcutaneous Q24H  . QUEtiapine  25 mg Oral QHS   Continuous Infusions:    LOS: 1 day        Hosie Poisson, MD [facility]   To contact the attending provider between 7A-7P or the covering  provider during after hours 7P-7A, please log into the web site [email or web address] and access using universal [place] password for that web site. If you do not have the password, please call the hospital operator.  06/09/2020, 12:23 PM

## 2020-06-09 NOTE — Plan of Care (Signed)
  Problem: Safety: Goal: Ability to remain free from injury will improve Outcome: Progressing   Problem: Pain Managment: Goal: General experience of comfort will improve Outcome: Progressing   Problem: Skin Integrity: Goal: Risk for impaired skin integrity will decrease Outcome: Progressing   

## 2020-06-09 NOTE — Evaluation (Signed)
Physical Therapy Evaluation Patient Details Name: Sonya George MRN: [id number] DOB: 1956-02-07 Today's Date: 06/09/2020   History of Present Illness  Pt is a 64 year old female who presented to the hospital with AMS. Per chart, pt's daughter reports that the AMS started after hearing about her sister's death. EEG study revealed mild diffuse encephalopathy and no seizures or spileptiform discharges were noted during the recording. CT of head was negative for any acute abnormalities. Medical hx of the following: stroke involving frontal lobe, HTN, and DM.  Clinical Impression  Pt is resistive to all functional mobility and minimally opening her eyes during the session this date. Secondary to her resistance to all cues she required mod-maxA for all bed mobility and total assist to attempt a STS transfer without success in clearing her buttocks from the bed. Pt mumbled throughout session. Attempted utilizing a Corporate treasurer via phone as family was not present, but the translator reported that the pt was not speaking Twi. Will continue to follow acutely to assess changes in cognitive status and willingness to participate in order to identify deficits more clearly to maximize her independence and safety with functional mobility. At this time, recommending SNF upon d/c.    Follow Up Recommendations SNF;Supervision/Assistance - 24 hour    Equipment Recommendations  Wheelchair (measurements PT);Wheelchair cushion (measurements PT);Hospital bed    Recommendations for Other Services       Precautions / Restrictions Precautions Precautions: Fall Precaution Comments: soft wrist and waist restraints Restrictions Weight Bearing Restrictions: No      Mobility  Bed Mobility Overal bed mobility: Needs Assistance Bed Mobility: Supine to Sit;Sit to Supine     Supine to sit: Max assist;HOB elevated Sit to supine: Mod assist;HOB elevated   General bed mobility comments: Pt pushing posteriorly and  laterally to R, requiring maxA to come to sit and modA to return to supine.    Transfers Overall transfer level: Needs assistance   Transfers: Sit to/from Stand Sit to Stand: Total assist         General transfer comment: Attempted STS with B knee block, but due to resistance unable to clear buttocks.  Ambulation/Gait             General Gait Details: Deferred due to safety concerns.  Stairs            Wheelchair Mobility    Modified Rankin (Stroke Patients Only)       Balance Overall balance assessment: Needs assistance Sitting-balance support: Bilateral upper extremity supported;Feet supported Sitting balance-Leahy Scale: Zero Sitting balance - Comments: MaxA to maintain static sitting bslsnce EOB due to posterior push. Postural control: Posterior lean;Right lateral lean     Standing balance comment: Unable to come to stand.                             Pertinent Vitals/Pain Pain Assessment: Faces Faces Pain Scale: Hurts little more Pain Location: generalized with movement Pain Descriptors / Indicators: Grimacing Pain Intervention(s): Limited activity within patient's tolerance;Monitored during session    Home Living Family/patient expects to be discharged to:: Private residence Living Arrangements: Children Available Help at Discharge: Family;Available PRN/intermittently Type of Home: House Home Access: Stairs to enter   Entrance Stairs-Number of Steps: ??? Home Layout: Two level;Able to live on main level with bedroom/bathroom Home Equipment: None Additional Comments: Info from 03/30/20 as pt not verbalizing in [place] and is resistive this date and family is not present.  Attempted to utilize Corporate treasurer via phone, but translator reported that the pt was not speaking Twi.    Prior Function Level of Independence: Independent         Comments: PLOF based on note from 03/30/20 as pt is resistive and family not present.     Hand  Dominance   Dominant Hand: Right    Extremity/Trunk Assessment   Upper Extremity Assessment Upper Extremity Assessment: Defer to OT evaluation    Lower Extremity Assessment Lower Extremity Assessment: Generalized weakness;Difficult to assess due to impaired cognition (Pt refusing to participate/follow cues)       Communication   Communication: Prefers language other than English (Twi)  Cognition Arousal/Alertness: Lethargic Behavior During Therapy: Restless Overall Cognitive Status: Impaired/Different from baseline Area of Impairment: Attention;Safety/judgement;Following commands                   Current Attention Level: Selective (resistive)   Following Commands: Follows one step commands inconsistently (TCs) Safety/Judgement: Decreased awareness of safety     General Comments: Pt resistive this date with all mobility and with TCs, unable to assess cognitive status due to language barrier. Pt minimally opening eyes.       General Comments      Exercises     Assessment/Plan    PT Assessment Patient needs continued PT services  PT Problem List Decreased strength;Decreased activity tolerance;Decreased balance;Decreased mobility;Decreased cognition;Decreased safety awareness;Decreased knowledge of precautions       PT Treatment Interventions DME instruction;Gait training;Stair training;Functional mobility training;Therapeutic activities;Therapeutic exercise;Balance training;Neuromuscular re-education;Cognitive remediation;Patient/family education    PT Goals (Current goals can be found in the Care Plan section)  Acute Rehab PT Goals Patient Stated Goal: Pt did not state PT Goal Formulation: Patient unable to participate in goal setting Time For Goal Achievement: 06/23/20 Potential to Achieve Goals: Fair    Frequency Min 3X/week   Barriers to discharge        Co-evaluation               AM-PAC PT "6 Clicks" Mobility  Outcome Measure Help needed  turning from your back to your side while in a flat bed without using bedrails?: A Lot Help needed moving from lying on your back to sitting on the side of a flat bed without using bedrails?: A Lot Help needed moving to and from a bed to a chair (including a wheelchair)?: Total Help needed standing up from a chair using your arms (e.g., wheelchair or bedside chair)?: Total Help needed to walk in hospital room?: Total Help needed climbing 3-5 steps with a railing? : Total 6 Click Score: 8    End of Session Equipment Utilized During Treatment: Gait belt Activity Tolerance: Patient limited by lethargy;Treatment limited secondary to agitation;Treatment limited secondary to medical complications (Comment) (resistive) Patient left: in bed;with call bell/phone within reach;with bed alarm set;with restraints reapplied Nurse Communication: Mobility status;Precautions PT Visit Diagnosis: Muscle weakness (generalized) (M62.81);Difficulty in walking, not elsewhere classified (R26.2)    Time: 6144-3154 PT Time Calculation (min) (ACUTE ONLY): 29 min   Charges:   PT Evaluation $PT Eval Moderate Complexity: 1 Mod PT Treatments $Therapeutic Activity: 8-22 mins        Moishe Spice, PT, DPT Acute Rehabilitation Services  Pager: [phone] Office: [phone]   Orvan Falconer 06/09/2020, 3:48 PM

## 2020-06-10 LAB — GLUCOSE, CAPILLARY
Glucose-Capillary: 238 mg/dL — ABNORMAL HIGH (ref 70–99)
Glucose-Capillary: 189 mg/dL — ABNORMAL HIGH (ref 70–99)
Glucose-Capillary: 174 mg/dL — ABNORMAL HIGH (ref 70–99)
Glucose-Capillary: 199 mg/dL — ABNORMAL HIGH (ref 70–99)

## 2020-06-10 MED ORDER — DIPHENHYDRAMINE HCL 50 MG/ML IJ SOLN
12.5000 mg | Freq: Three times a day (TID) | INTRAMUSCULAR | Status: DC | PRN
  Administered 2020-06-10 – 2020-06-20 (×5): 12.5 mg via INTRAVENOUS
  Filled 2020-06-10 (×6): qty 1

## 2020-06-10 MED ORDER — LABETALOL HCL 5 MG/ML IV SOLN
5.0000 mg | INTRAVENOUS | Status: DC | PRN
  Administered 2020-06-10: 5 mg via INTRAVENOUS
  Filled 2020-06-10: qty 4

## 2020-06-10 MED ORDER — HALOPERIDOL LACTATE 5 MG/ML IJ SOLN
5.0000 mg | Freq: Four times a day (QID) | INTRAMUSCULAR | Status: DC | PRN
  Administered 2020-06-10 – 2020-06-18 (×6): 5 mg via INTRAMUSCULAR
  Filled 2020-06-10 (×6): qty 1

## 2020-06-10 NOTE — Plan of Care (Signed)
  Problem: Safety: Goal: Ability to remain free from injury will improve Outcome: Progressing   Problem: Skin Integrity: Goal: Risk for impaired skin integrity will decrease Outcome: Progressing   

## 2020-06-10 NOTE — Progress Notes (Signed)
Inpatient Diabetes Program Recommendations  AACE/ADA: New Consensus Statement on Inpatient Glycemic Control (2015)  Target Ranges:  Prepandial:   less than 140 mg/dL      Peak postprandial:   less than 180 mg/dL (1-2 hours)      Critically ill patients:  140 - 180 mg/dL   Results for Sonya George, Sonya George (MRN [id number]) as of 06/10/2020 07:35  Ref. Range 06/09/2020 06:09 06/09/2020 17:59 06/09/2020 21:19  Glucose-Capillary Latest Ref Range: 70 - 99 mg/dL 246 (H) 237 (H)  3 units NOVOLOG  198 (H)   Results for Sonya George, Sonya George (MRN [id number]) as of 06/10/2020 07:35  Ref. Range 06/10/2020 06:37  Glucose-Capillary Latest Ref Range: 70 - 99 mg/dL 238 (H)  3 units NOVOLOG    Results for Sonya George, Sonya George (MRN [id number]) as of 06/10/2020 07:35  Ref. Range 06/09/2020 16:17  Hemoglobin A1C Latest Ref Range: 4.8 - 5.6 % 6.9 (H)    Admit with: AMS (probably secondary to focal seizures in the setting of encephalomalacia from prior left MCA territory stroke versus behavioral abnormalities from acute psychosis vs delirium)  History: DM, CVA  Home DM Meds: Metformin 1000 mg BID  Current Orders: Novolog Sensitive Correction Scale/ SSI (0-9 units) TID AC     MD- Note AM CBGs have been >200 the last 2 days.  Novolog SSI started last PM.  Please consider adding low dose basal insulin to in-hospital insulin regimen while home Metformin is on hold:  Levemir 6 units Daily (0.1 units/kg)    --Will follow patient during hospitalization--  Wyn Quaker RN, MSN, CDE Diabetes Coordinator Inpatient Glycemic Control Team Team Pager: [phone] (8a-5p)

## 2020-06-10 NOTE — Progress Notes (Signed)
PROGRESS NOTE    Sonya George  RWE:[id number] DOB: [birth year] DOA: 06/07/2020 PCP: Maximiano Coss, NP    Chief Complaint  Patient presents with  . Altered Mental Status    Brief Narrative:   64 year old lady with prior history of hypertension, diabetes recent history of multiple strokes in August 2021 presents to ED with confusion, delirious and agitation. Patient speaks [place] dialect from [place], could not get any history from the patient at this time CT head showed Mild cerebral atrophy with chronic microvascular ischemic changes and patchy areas of encephalomalacia/gliosis in the cerebrum and cerebellum. Coronavirus and influenza PCR is negative.  Neurology consulted, suggested that recent decompensation is more likely a stress reaction to her sister's passing and recommended psychiatry consult.  Pt seen and examined atbedside with her daughter by the side.  Pt still very confused, restless, removing the tele leads.    Assessment & Plan:   Principal Problem:   Acute encephalopathy Active Problems:   Type 2 diabetes mellitus (HCC)   Essential hypertension   Seizure (HCC)   Hyperlipidemia   Acute encephalopathy probably secondary to focal seizures in the setting of encephalomalacia from prior left MCA territory stroke versus behavioral abnormalities from acute psychosis vs delirium.  EEG done this am suggestive of mild diffuse encephalopathy, nonspecific in etiology.  No seizures or epileptiform discharges were seen throughout the reading. She was given Keppra initially but discontinued due to worsening of her behavioral abnormalities. As needed Haldol, IV Ativan ordered.  IV Haldol increased to 5 mg every 6 hours as needed.  Psychiatric evaluation also ordered. Seroquel 25 mg ordered for bedtime. TSH, vitamin B12 within normal limits Urine drug screen is positive for benzodiazepines. Urine analysis and chest x-ray is negative for infection.  She remains afebrile and WBC  count within normal limits. Neurology on board suggested that recent decompensation is more likely a stress reaction to her sister's passing and recommended psychiatry consult.    Type 2 diabetes mellitus CBG (last 3)  Recent Labs    06/09/20 2119 06/10/20 0637 06/10/20 1227  GLUCAP 198* 238* 189*   Continue with sliding scale insulin for now    History of recent left MCA territory stroke: As per the daughter patient is noncompliant to meds, occasionally confused and spits out the meds. Patient is currently on heart healthy diet.   Essential hypertension BP parameters are elevated. Prn labetalol ordered.      DVT prophylaxis: LOVENOX.  Code Status: full code.  Family Communication: Daughter at bedside Disposition:   Status is: Inpatient  The patient will require care spanning > 2 midnights and should be moved to inpatient because: Ongoing diagnostic testing needed not appropriate for outpatient work up, Unsafe d/c plan, IV treatments appropriate due to intensity of illness or inability to take PO and Inpatient level of care appropriate due to severity of illness, pt confused , agitated, in restraints.   Dispo: The patient is from: Home              Anticipated d/c is to: pending.               Anticipated d/c date is: 2 days              Patient currently is not medically stable to d/c.       Consultants:   Neurology.   Psychiatry   Procedures: EEG   Antimicrobials: none.   Subjective: Confused, restless slightly agitated and mumbling.  Objective: Vitals:   06/10/20  0408 06/10/20 1235 06/10/20 1352 06/10/20 1415  BP: 98/66 (!) 159/90 115/85 115/85  Pulse: 93 (!) 115 (!) 115 (!) 109  Resp: 17 18 17 19   Temp: 98.2 F (36.8 C) 97.9 F (36.6 C)  97.9 F (36.6 C)  TempSrc: Oral Axillary    SpO2: 93% 97% 97% 100%  Weight:      Height:        Intake/Output Summary (Last 24 hours) at 06/10/2020 1454 Last data filed at 06/10/2020 0715 Gross per 24  hour  Intake 560 ml  Output --  Net 560 ml   Filed Weights   06/07/20 0840  Weight: 63.5 kg    Examination:  General exam: Alert and comfortable not in any kind of distress. Respiratory system: Clear to auscultation, no wheezing Cardiovascular system: S1-S2 heard, tachycardic, no JVD Gastrointestinal system: Mini soft, nontender, bowel sounds normal Central nervous system: Confused, agitated, pulling out the telemetry leads able to move all extremities Extremities: No pedal edema or cyanosis Skin: No rashes seen Psychiatry: Cannot be assessed due to confusion, agitation and hallucinations.    Data Reviewed: I have personally reviewed following labs and imaging studies  CBC: Recent Labs  Lab 06/07/20 0901 06/09/20 1346  WBC 7.7 9.5  NEUTROABS 4.9  --   HGB 12.6 14.1  HCT 38.2 41.4  MCV 91.0 87.9  PLT 250 539    Basic Metabolic Panel: Recent Labs  Lab 06/07/20 0901 06/08/20 1302 06/09/20 1346  NA 139  --  137  K 3.8  --  3.9  CL 102  --  102  CO2 27  --  24  GLUCOSE 166*  --  244*  BUN 9  --  5*  CREATININE 0.71  --  0.67  CALCIUM 9.9  --  9.8  MG  --  1.8  --   PHOS  --  2.5  --     GFR: Estimated Creatinine Clearance: 65.6 mL/min (by C-G formula based on SCr of 0.67 mg/dL).  Liver Function Tests: Recent Labs  Lab 06/07/20 0901 06/09/20 1346  AST 40 48*  ALT 20 26  ALKPHOS 73 73  BILITOT 0.9 1.0  PROT 7.2 7.3  ALBUMIN 3.7 3.6    CBG: Recent Labs  Lab 06/09/20 0609 06/09/20 1759 06/09/20 2119 06/10/20 0637 06/10/20 1227  GLUCAP 246* 237* 198* 238* 189*     Recent Results (from the past 240 hour(s))  Respiratory Panel by RT PCR (Flu A&B, Covid) - Nasopharyngeal Swab     Status: None   Collection Time: 06/07/20 10:39 AM   Specimen: Nasopharyngeal Swab  Result Value Ref Range Status   SARS Coronavirus 2 by RT PCR NEGATIVE NEGATIVE Final    Comment: (NOTE) SARS-CoV-2 target nucleic acids are NOT DETECTED.  The SARS-CoV-2 RNA is  generally detectable in upper respiratoy specimens during the acute phase of infection. The lowest concentration of SARS-CoV-2 viral copies this assay can detect is 131 copies/mL. A negative result does not preclude SARS-Cov-2 infection and should not be used as the sole basis for treatment or other patient management decisions. A negative result may occur with  improper specimen collection/handling, submission of specimen other than nasopharyngeal swab, presence of viral mutation(s) within the areas targeted by this assay, and inadequate number of viral copies (<131 copies/mL). A negative result must be combined with clinical observations, patient history, and epidemiological information. The expected result is Negative.  Fact Sheet for Patients:  PinkCheek.be  Fact Sheet for Healthcare Providers:  GravelBags.it  This test is no t yet approved or cleared by the [place] and  has been authorized for detection and/or diagnosis of SARS-CoV-2 by FDA under an Emergency Use Authorization (EUA). This EUA will remain  in effect (meaning this test can be used) for the duration of the COVID-19 declaration under Section 564(b)(1) of the Act, 21 U.S.C. section 360bbb-3(b)(1), unless the authorization is terminated or revoked sooner.     Influenza A by PCR NEGATIVE NEGATIVE Final   Influenza B by PCR NEGATIVE NEGATIVE Final    Comment: (NOTE) The Xpert Xpress SARS-CoV-2/FLU/RSV assay is intended as an aid in  the diagnosis of influenza from Nasopharyngeal swab specimens and  should not be used as a sole basis for treatment. Nasal washings and  aspirates are unacceptable for Xpert Xpress SARS-CoV-2/FLU/RSV  testing.  Fact Sheet for Patients: PinkCheek.be  Fact Sheet for Healthcare Providers: GravelBags.it  This test is not yet approved or cleared by the [place] FDA and  has been authorized for detection and/or diagnosis of SARS-CoV-2 by  FDA under an Emergency Use Authorization (EUA). This EUA will remain  in effect (meaning this test can be used) for the duration of the  Covid-19 declaration under Section 564(b)(1) of the Act, 21  U.S.C. section 360bbb-3(b)(1), unless the authorization is  terminated or revoked. Performed at [facility], [place] [address] [place], [place] [address]          Radiology Studies: No results found.      Scheduled Meds: . enoxaparin (LOVENOX) injection  40 mg Subcutaneous Q24H  . insulin aspart  0-9 Units Subcutaneous TID WC  . QUEtiapine  25 mg Oral QHS   Continuous Infusions:    LOS: 2 days        Hosie Poisson, MD [facility]   To contact the attending provider between 7A-7P or the covering provider during after hours 7P-7A, please log into the web site [email or web address] and access using universal [place] password for that web site. If you do not have the password, please call the hospital operator.  06/10/2020, 2:54 PM

## 2020-06-10 NOTE — Consult Note (Signed)
  Sonya George is a 64 year old female with past medical history of hypertension, diabetes, multiple CVAs in August 2021, who presented to the ED with worsening confusion, delirious, and agitation.  Neurology was consulted who suggested that recent decompensation is more likely to a stress reaction to her sister's passing and recommended psychiatric consult.  As a result a psych consult was placed for hallucinations and acute psychosis.  Patient is seen and attempted to evaluate, however she is nonverbal at this time.  Patient is very restless, mumbling of speech, and continues to make multiple attempts to get out of the bed.  Her daughter who is at the bedside is able to provide some insight into ongoing mental illness.  Daughter denies any previous psychiatric history, previous family history, and or substance abuse.  Her daughter acknowledges that her symptoms of confusion, agitation, and delirium began shortly after her stroke.  She reports that her mother recently had a stroke, and the neurologist suspects she has had another stroke.  The daughter's main concern is her ongoing periods of confusion and wanting to start medication.  As per the daughter her goal is to return her mother to [place].  She acknowledges that this is a 16-hour flight, and they have a trip planned for November 20.  Writer inquired about specific symptoms she is wanting to treat in which she states" not to fight, sit down quietly, and try to relax".  Did discuss with daughter and length are goal is to treat her symptoms of hallucinations, confusion, and ongoing psychosis.  However we would not recommend or prescribe medications to sedate patient for an international flight.  Did discuss with daughter also the current symptoms and presentation may be due to residual stroke.  Recent EEG shows mild diffuse encephalopathy.  Her primary team has ordered as needed Haldol and Ativan as needed, she does appear to have some improvement with  these medications.  She was also ordered Seroquel 25 mg p.o. nightly.  Recent EKG shows QTC of 434.  Patient continues to be confused, mumbling of speech, and exhibiting symptoms consistent with encephalopathy, post MCA stroke.  It is also worth noting that her CT scan showed mild cerebral atrophy with chronic microvascular ischemic changes, and patchy areas of encephalomalacia in the cerebrum and cerebellum.  Patient will benefit from antipsychotic Seroquel which is already been initiated.  If patient continues to exhibit signs of agitation, confusion and or depression, may benefit from citalopram.  At this time we are unable to complete a psychiatric evaluation, please defer to neurology for further management of symptoms above.  Psychiatry to sign off at this time.

## 2020-06-11 DIAGNOSIS — E119 Type 2 diabetes mellitus without complications: Secondary | ICD-10-CM

## 2020-06-11 LAB — GLUCOSE, CAPILLARY
Glucose-Capillary: 204 mg/dL — ABNORMAL HIGH (ref 70–99)
Glucose-Capillary: 253 mg/dL — ABNORMAL HIGH (ref 70–99)
Glucose-Capillary: 121 mg/dL — ABNORMAL HIGH (ref 70–99)
Glucose-Capillary: 179 mg/dL — ABNORMAL HIGH (ref 70–99)

## 2020-06-11 MED ORDER — ASPIRIN EC 81 MG PO TBEC
81.0000 mg | DELAYED_RELEASE_TABLET | Freq: Every day | ORAL | Status: DC
  Administered 2020-06-11 – 2020-06-18 (×8): 81 mg via ORAL
  Filled 2020-06-11 (×11): qty 1

## 2020-06-11 MED ORDER — ADULT MULTIVITAMIN W/MINERALS CH
1.0000 | ORAL_TABLET | Freq: Every day | ORAL | Status: DC
  Administered 2020-06-11 – 2020-06-25 (×15): 1 via ORAL
  Filled 2020-06-11 (×15): qty 1

## 2020-06-11 MED ORDER — AMLODIPINE BESYLATE 10 MG PO TABS
10.0000 mg | ORAL_TABLET | Freq: Every day | ORAL | Status: DC
  Administered 2020-06-11 – 2020-06-20 (×9): 10 mg via ORAL
  Filled 2020-06-11 (×10): qty 1

## 2020-06-11 MED ORDER — ATORVASTATIN CALCIUM 40 MG PO TABS
40.0000 mg | ORAL_TABLET | Freq: Every day | ORAL | Status: DC
  Administered 2020-06-11 – 2020-06-24 (×12): 40 mg via ORAL
  Filled 2020-06-11 (×13): qty 1

## 2020-06-11 NOTE — Progress Notes (Signed)
PROGRESS NOTE        PATIENT DETAILS Name: Sonya George Age: 64 y.o. Sex: female Date of Birth: 06-Jan-1956 Admit Date: 06/07/2020 Admitting Physician Reubin Milan, MD FFM:[id number], Delfino Lovett, NP  Brief Narrative: Patient is a 64 y.o. female with past medical history of HTN, DM, prior CVA-who presented with altered mental status after getting news of her sister's passing.  She was admitted for further work-up-extensive neuroimaging/EEG negative-evaluated by both neurology and psychiatry-thought to have functional/stress-induced etiology for altered mental status.  Significant events: 11/6>> admit to [facility] for confusion  Significant studies: 11/6>> CT head: No acute intracranial abnormality. 11/6>> chest x-ray: No active pulmonary disease. 11/7>> RPR: Nonreactive 11/7>> vitamin B12: 561 11/7>> EEG: Mild diffuse encephalopathy-nonspecific etiology-no seizures.  Antimicrobial therapy: None  Microbiology data: None  Procedures : None  Consults: Neurology, psychiatry  DVT Prophylaxis : enoxaparin (LOVENOX) injection 40 mg Start: 06/07/20 2100  Subjective: Able to answer 1-2 questions in one-word answers.  Per daughter-although improved-she is not yet back to baseline.  Assessment/Plan: Acute confusional state: Thought to be functional-stress response to her sister's passing.  Neuroimaging/EEG and other work-up as above.  Neurology/psychiatry evaluation completed-both services have signed off.  Per daughter-some improvement-patient able to follow some commands at times-able to eat without any assistance-plan is to continue reassurance-supportive care.  Remains on Seroquel.  History of CVA: Resume aspirin and statin.  Reviewed prior discharge summary-was only supposed to be on aspirin/Plavix for 3 weeks.  HLD: Continue statin  HTN: BP slowly creeping up-resume amlodipine-continue to hold lisinopril-follow and adjust accordingly.  DM-2 (A1c 6.9  on 11/8): CBG stable-continue SSI  Recent Labs    06/10/20 2101 06/11/20 0754 06/11/20 1328  GLUCAP 199* 204* 253*    Diet: Diet Order            Diet heart healthy/carb modified Room service appropriate? Yes; Fluid consistency: Thin  Diet effective now                  Code Status: Full code   Family Communication: Daughter at bedside  Disposition Plan: Status is: Inpatient  Remains inpatient appropriate because:Inpatient level of care appropriate due to severity of illness   Dispo: The patient is from: Home              Anticipated d/c is to: TBD              Anticipated d/c date is: 2 days              Patient currently is not medically stable to d/c.   Barriers to Discharge: Continued altered mental status-not yet at baseline.  Antimicrobial agents: Anti-infectives (From admission, onward)   None       Time spent: 25- minutes-Greater than 50% of this time was spent in counseling, explanation of diagnosis, planning of further management, and coordination of care.  MEDICATIONS: Scheduled Meds: . enoxaparin (LOVENOX) injection  40 mg Subcutaneous Q24H  . insulin aspart  0-9 Units Subcutaneous TID WC  . QUEtiapine  25 mg Oral QHS   Continuous Infusions: PRN Meds:.acetaminophen **OR** acetaminophen, diphenhydrAMINE, haloperidol lactate, labetalol, LORazepam, ondansetron **OR** ondansetron (ZOFRAN) IV   PHYSICAL EXAM: Vital signs: Vitals:   06/10/20 2313 06/11/20 0320 06/11/20 0802 06/11/20 1333  BP: 121/90 (!) 129/96 130/87 131/78  Pulse: 78 86 100 97  Resp: 16 18 18  18  Temp: 98.6 F (37 C) (!) 97.4 F (36.3 C) 97.8 F (36.6 C) 98.6 F (37 C)  TempSrc: Oral Oral Oral Oral  SpO2: 100% 100% 98% 100%  Weight:      Height:       Filed Weights   06/07/20 0840  Weight: 63.5 kg   Body mass index is 22.6 kg/m.   Gen Exam: Confused-only follows some commands.  Appears weak. HEENT:atraumatic, normocephalic Chest: B/L clear to auscultation  anteriorly CVS:S1S2 regular Abdomen:soft non tender, non distended Extremities:no edema Neurology: Difficult exam but seems to be moving all 4 extremities Skin: no rash  I have personally reviewed following labs and imaging studies  LABORATORY DATA: CBC: Recent Labs  Lab 06/07/20 0901 06/09/20 1346  WBC 7.7 9.5  NEUTROABS 4.9  --   HGB 12.6 14.1  HCT 38.2 41.4  MCV 91.0 87.9  PLT 250 782    Basic Metabolic Panel: Recent Labs  Lab 06/07/20 0901 06/08/20 1302 06/09/20 1346  NA 139  --  137  K 3.8  --  3.9  CL 102  --  102  CO2 27  --  24  GLUCOSE 166*  --  244*  BUN 9  --  5*  CREATININE 0.71  --  0.67  CALCIUM 9.9  --  9.8  MG  --  1.8  --   PHOS  --  2.5  --     GFR: Estimated Creatinine Clearance: 65.6 mL/min (by C-G formula based on SCr of 0.67 mg/dL).  Liver Function Tests: Recent Labs  Lab 06/07/20 0901 06/09/20 1346  AST 40 48*  ALT 20 26  ALKPHOS 73 73  BILITOT 0.9 1.0  PROT 7.2 7.3  ALBUMIN 3.7 3.6   No results for input(s): LIPASE, AMYLASE in the last 168 hours. Recent Labs  Lab 06/07/20 0901 06/08/20 1302  AMMONIA 29 17    Coagulation Profile: No results for input(s): INR, PROTIME in the last 168 hours.  Cardiac Enzymes: No results for input(s): CKTOTAL, CKMB, CKMBINDEX, TROPONINI in the last 168 hours.  BNP (last 3 results) No results for input(s): PROBNP in the last 8760 hours.  Lipid Profile: No results for input(s): CHOL, HDL, LDLCALC, TRIG, CHOLHDL, LDLDIRECT in the last 72 hours.  Thyroid Function Tests: No results for input(s): TSH, T4TOTAL, FREET4, T3FREE, THYROIDAB in the last 72 hours.  Anemia Panel: No results for input(s): VITAMINB12, FOLATE, FERRITIN, TIBC, IRON, RETICCTPCT in the last 72 hours.  Urine analysis:    Component Value Date/Time   COLORURINE STRAW (A) 06/08/2020 1349   APPEARANCEUR CLEAR 06/08/2020 1349   LABSPEC 1.015 06/08/2020 1349   PHURINE 8.0 06/08/2020 1349   GLUCOSEU 100 (A) 06/08/2020  1349   HGBUR NEGATIVE 06/08/2020 1349   BILIRUBINUR NEGATIVE 06/08/2020 1349   KETONESUR 15 (A) 06/08/2020 1349   PROTEINUR NEGATIVE 06/08/2020 1349   NITRITE NEGATIVE 06/08/2020 1349   LEUKOCYTESUR NEGATIVE 06/08/2020 1349    Sepsis Labs: Lactic Acid, Venous No results found for: LATICACIDVEN  MICROBIOLOGY: Recent Results (from the past 240 hour(s))  Respiratory Panel by RT PCR (Flu A&B, Covid) - Nasopharyngeal Swab     Status: None   Collection Time: 06/07/20 10:39 AM   Specimen: Nasopharyngeal Swab  Result Value Ref Range Status   SARS Coronavirus 2 by RT PCR NEGATIVE NEGATIVE Final    Comment: (NOTE) SARS-CoV-2 target nucleic acids are NOT DETECTED.  The SARS-CoV-2 RNA is generally detectable in upper respiratoy specimens during the acute phase  of infection. The lowest concentration of SARS-CoV-2 viral copies this assay can detect is 131 copies/mL. A negative result does not preclude SARS-Cov-2 infection and should not be used as the sole basis for treatment or other patient management decisions. A negative result may occur with  improper specimen collection/handling, submission of specimen other than nasopharyngeal swab, presence of viral mutation(s) within the areas targeted by this assay, and inadequate number of viral copies (<131 copies/mL). A negative result must be combined with clinical observations, patient history, and epidemiological information. The expected result is Negative.  Fact Sheet for Patients:  PinkCheek.be  Fact Sheet for Healthcare Providers:  GravelBags.it  This test is no t yet approved or cleared by the [place] FDA and  has been authorized for detection and/or diagnosis of SARS-CoV-2 by FDA under an Emergency Use Authorization (EUA). This EUA will remain  in effect (meaning this test can be used) for the duration of the COVID-19 declaration under Section 564(b)(1) of the Act, 21  U.S.C. section 360bbb-3(b)(1), unless the authorization is terminated or revoked sooner.     Influenza A by PCR NEGATIVE NEGATIVE Final   Influenza B by PCR NEGATIVE NEGATIVE Final    Comment: (NOTE) The Xpert Xpress SARS-CoV-2/FLU/RSV assay is intended as an aid in  the diagnosis of influenza from Nasopharyngeal swab specimens and  should not be used as a sole basis for treatment. Nasal washings and  aspirates are unacceptable for Xpert Xpress SARS-CoV-2/FLU/RSV  testing.  Fact Sheet for Patients: PinkCheek.be  Fact Sheet for Healthcare Providers: GravelBags.it  This test is not yet approved or cleared by the [place] FDA and  has been authorized for detection and/or diagnosis of SARS-CoV-2 by  FDA under an Emergency Use Authorization (EUA). This EUA will remain  in effect (meaning this test can be used) for the duration of the  Covid-19 declaration under Section 564(b)(1) of the Act, 21  U.S.C. section 360bbb-3(b)(1), unless the authorization is  terminated or revoked. Performed at [facility], [place] [address], [place], [place] [address]     RADIOLOGY STUDIES/RESULTS: No results found.   LOS: 3 days   Oren Binet, MD  [facility]    To contact the attending provider between 7A-7P or the covering provider during after hours 7P-7A, please log into the web site [email or web address] and access using universal [place] password for that web site. If you do not have the password, please call the hospital operator.  06/11/2020, 1:43 PM

## 2020-06-11 NOTE — Progress Notes (Signed)
Physical Therapy Treatment Patient Details Name: Sonya George MRN: [id number] DOB: 04-Apr-1956 Today's Date: 06/11/2020    History of Present Illness 64 y.o. female admitted with AMS with difficulty communicating. 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 pt noncompliant spits out medications PMH CVA HTN DM seizures    PT Comments    Treated pt in conjunction with OT this date for max pt safety and to address impulsive tendencies. Pt currently requires total +2 max (A) for all functional mobility. Pt impulsive to attempt to ambulate with poor management of RW and with tendency to swing L LE laterally causing it to collide with RW on occasion. MaxAx2 provided during gait to maintain balance, manage RW, and assist with L foot placement to prevent injury. Pt s daughter present and requesting to take patient home for d/c. Pt will require extensive care at this time due to dependence for all functional mobility.  Pt will benefit from skilled PT to increase their independence and safety with mobility and balance. Recommending discharge to SNF but will likely d/c home per family request so HHPT needed.   Follow Up Recommendations  SNF;Supervision/Assistance - 24 hour (family desires to take pt home so [facility] PT)     Equipment Recommendations  Wheelchair (measurements PT);Wheelchair cushion (measurements PT);Hospital bed;3in1 (PT);Rolling walker with 5" wheels    Recommendations for Other Services       Precautions / Restrictions Precautions Precautions: Fall Precaution Comments: soft wrist and waist restraints Restrictions Weight Bearing Restrictions: No    Mobility  Bed Mobility Overal bed mobility: Needs Assistance Bed Mobility: Supine to Sit     Supine to sit: +2 for physical assistance;Max assist     General bed mobility comments: pt requires maxAx2 to initiate and progress to eob. pt does not follow command. pt static sitting with min (A) daughter close guarding pt at all  times  Transfers Overall transfer level: Needs assistance Equipment used: Rolling walker (2 wheeled) Transfers: Sit to/from Stand Sit to Stand: +2 physical assistance;Max assist         General transfer comment: pt requires L LE facilitation due to striking leg on RW and lack of awareness. pt requires max encouragement to stand and once standing initiated walking impulsively. pt completed sit<>Stand x3 this session with one failed attempt due to resistance  Ambulation/Gait Ambulation/Gait assistance: +2 physical assistance;Max assist Gait Distance (Feet): 25 Feet (x2 bouts of ~25 ft > ~15 ft) Assistive device: Rolling walker (2 wheeled) Gait Pattern/deviations: Step-through pattern;Narrow base of support (L LE lateral swing) Gait velocity: decreased Gait velocity interpretation: <1.31 ft/sec, indicative of household ambulator General Gait Details: Pt tends to swing L LE laterally, cauing it to collide with RW on occasion. MaxAx2 provided along with TCs at L LE to direct L foot placement to avoid hitting foot on RW. Assistance provided to manage RW safely.   Stairs             Wheelchair Mobility    Modified Rankin (Stroke Patients Only)       Balance Overall balance assessment: Needs assistance Sitting-balance support: No upper extremity supported;Feet supported Sitting balance-Leahy Scale: Fair Sitting balance - Comments: Pt able to maintain static sitting balance with min guard-minA with UE support on EOB.   Standing balance support: Bilateral upper extremity supported;During functional activity Standing balance-Leahy Scale: Poor Standing balance comment: releasing rw and no awareness  Cognition Arousal/Alertness: Awake/alert Behavior During Therapy: Restless Overall Cognitive Status: Difficult to assess                                 General Comments: pt does not follow commands. pt holding liquids in the  mouth and daughter states yesterday she spit i all over me. pt noted to have internal distractions at times.       Exercises      General Comments        Pertinent Vitals/Pain Pain Assessment: No/denies pain Pain Intervention(s): Limited activity within patient's tolerance;Monitored during session    Home Living Family/patient expects to be discharged to:: Private residence Living Arrangements: Children Available Help at Discharge: Family;Available PRN/intermittently Type of Home: House Home Access: Stairs to enter   Home Layout: Two level;Able to live on main level with bedroom/bathroom Home Equipment: None Additional Comments: daughter reports Indep cooking cleaning and helping caregive for children in the home. daughter states "she got my kids ready for school for me"    Prior Function Level of Independence: Independent          PT Goals (current goals can now be found in the care plan section) Acute Rehab PT Goals Patient Stated Goal: none stated by patient PT Goal Formulation: With patient/family Time For Goal Achievement: 06/23/20 Potential to Achieve Goals: Fair Progress towards PT goals: Progressing toward goals    Frequency    Min 3X/week      PT Plan Discharge plan needs to be updated    Co-evaluation PT/OT/SLP Co-Evaluation/Treatment: Yes Reason for Co-Treatment: Complexity of the patient's impairments (multi-system involvement);Necessary to address cognition/behavior during functional activity;For patient/therapist safety;To address functional/ADL transfers PT goals addressed during session: Mobility/safety with mobility;Balance;Proper use of DME OT goals addressed during session: Proper use of Adaptive equipment and DME;ADL's and self-care;Strengthening/ROM      AM-PAC PT "6 Clicks" Mobility   Outcome Measure  Help needed turning from your back to your side while in a flat bed without using bedrails?: A Lot Help needed moving from lying on your  back to sitting on the side of a flat bed without using bedrails?: A Lot Help needed moving to and from a bed to a chair (including a wheelchair)?: A Lot Help needed standing up from a chair using your arms (e.g., wheelchair or bedside chair)?: A Lot Help needed to walk in hospital room?: A Lot Help needed climbing 3-5 steps with a railing? : Total 6 Click Score: 11    End of Session Equipment Utilized During Treatment: Gait belt Activity Tolerance: Patient tolerated treatment well Patient left: in chair;with call bell/phone within reach;with chair alarm set;with family/visitor present;with restraints reapplied (daughter present) Nurse Communication: Mobility status;Precautions PT Visit Diagnosis: Muscle weakness (generalized) (M62.81);Difficulty in walking, not elsewhere classified (R26.2);Other abnormalities of gait and mobility (R26.89);Unsteadiness on feet (R26.81)     Time: 6378-5885 PT Time Calculation (min) (ACUTE ONLY): 31 min  Charges:  $Gait Training: 8-22 mins                     Moishe Spice, PT, DPT Acute Rehabilitation Services  Pager: [phone] Office: [place] 06/11/2020, 10:40 AM

## 2020-06-11 NOTE — Evaluation (Signed)
Occupational Therapy Evaluation Patient Details Name: Sonya George MRN: [id number] DOB: 1955-12-10 Today's Date: 06/11/2020    History of Present Illness 64 y.o. female admitted with AMS with difficulty communicating. 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 pt noncompliant spits out medications PMH CVA HTN DM seizures   Clinical Impression   PT admitted with AMS. Pt currently with functional limitiations due to the deficits listed below (see OT problem list). Pt currently requires total +2 max (A) for safe transfers with RW. Pt s daughter present and requesting to take patient home for d/c. Pt will require extensive care at this time due to dependence for all adls.  Pt will benefit from skilled OT to increase their independence and safety with adls and balance to allow discharge SNF but will likely d/c home so HHOT needed.     Follow Up Recommendations  SNF;Other (comment) (family to d/c home with pt so HHOT)    Equipment Recommendations  3 in 1 bedside commode;Wheelchair (measurements OT);Wheelchair cushion (measurements OT)    Recommendations for Other Services       Precautions / Restrictions Precautions Precautions: Fall Precaution Comments: soft wrist and waist restraints      Mobility Bed Mobility Overal bed mobility: Needs Assistance Bed Mobility: Supine to Sit     Supine to sit: +2 for physical assistance;Max assist     General bed mobility comments: pt requires (A) to initiate and progres to eob. pt does not follow command. pt static sitting with min (A) daughter close guarding pt at all times    Transfers Overall transfer level: Needs assistance Equipment used: Rolling walker (2 wheeled) Transfers: Sit to/from Stand Sit to Stand: +2 physical assistance;Max assist         General transfer comment: pt requires L LE facilitation due to striking leg on RW and lack of awareness. pt requires max encouragement to stand and once standing initiated  walking impulsively. pt completed sit<>Stand x3 this session with one failed attempt due to resistance    Balance Overall balance assessment: Needs assistance Sitting-balance support: No upper extremity supported;Feet supported Sitting balance-Leahy Scale: Fair     Standing balance support: Bilateral upper extremity supported;During functional activity Standing balance-Leahy Scale: Poor Standing balance comment: releasing rw and not awareness                           ADL either performed or assessed with clinical judgement   ADL Overall ADL's : Needs assistance/impaired Eating/Feeding: Moderate assistance   Grooming: Maximal assistance;Bed level Grooming Details (indicate cue type and reason): daughter brushing teeth. pt requires >4 minutes to get pt to spit out liquids                                     Vision   Additional Comments: difficult to fully assess but with R gaze preference. pt will scan L but not sustain     Perception     Praxis      Pertinent Vitals/Pain Pain Assessment: No/denies pain     Hand Dominance Right   Extremity/Trunk Assessment Upper Extremity Assessment Upper Extremity Assessment: LUE deficits/detail;RUE deficits/detail RUE Deficits / Details: movement noted but pointing with L UE RUE Sensation: decreased proprioception RUE Coordination: decreased fine motor;decreased gross motor LUE Deficits / Details: AROM shoulder flexion 90 degrees with supination to point to need. daughter reports this  is baseline LUE Coordination: decreased fine motor;decreased gross motor   Lower Extremity Assessment Lower Extremity Assessment: Defer to PT evaluation;LLE deficits/detail LLE Deficits / Details: running L foot into the RW and no awareness to striking foot   Cervical / Trunk Assessment Cervical / Trunk Assessment: Kyphotic   Communication Communication Communication: Prefers language other than English;Other (comment)  (daughter reports "yes she knows english")   Cognition Arousal/Alertness: Awake/alert Behavior During Therapy: Restless Overall Cognitive Status: Difficult to assess                                 General Comments: pt does not follow commands. pt holding liquids in the mouth and daughter states yesterday she spit i all over me. pt noted to have internal distractions at times.    General Comments       Exercises     Shoulder Instructions      Home Living Family/patient expects to be discharged to:: Private residence Living Arrangements: Children Available Help at Discharge: Family;Available PRN/intermittently Type of Home: House Home Access: Stairs to enter Entrance Stairs-Number of Steps: 0   Home Layout: Two level;Able to live on main level with bedroom/bathroom Alternate Level Stairs-Number of Steps: flight   Bathroom Shower/Tub: Teacher, early years/pre: Standard     Home Equipment: None   Additional Comments: daughter reports Indep cooking cleaning and helping caregive for children in the home. daughter states "she got my kids ready for school for me"      Prior Functioning/Environment Level of Independence: Independent                 OT Problem List: Decreased strength;Decreased activity tolerance;Impaired balance (sitting and/or standing);Decreased cognition;Decreased safety awareness;Decreased knowledge of use of DME or AE;Decreased knowledge of precautions;Decreased coordination      OT Treatment/Interventions: Self-care/ADL training;Therapeutic exercise;DME and/or AE instruction;Neuromuscular education;Therapeutic activities;Patient/family education;Balance training;Cognitive remediation/compensation    OT Goals(Current goals can be found in the care plan section) Acute Rehab OT Goals Patient Stated Goal: none stated by patient OT Goal Formulation: With family Time For Goal Achievement: 06/25/20 Potential to Achieve Goals: Good   OT Frequency: Min 2X/week   Barriers to D/C:            Co-evaluation PT/OT/SLP Co-Evaluation/Treatment: Yes Reason for Co-Treatment: Necessary to address cognition/behavior during functional activity;For patient/therapist safety;To address functional/ADL transfers   OT goals addressed during session: Proper use of Adaptive equipment and DME;ADL's and self-care;Strengthening/ROM      AM-PAC OT "6 Clicks" Daily Activity     Outcome Measure Help from another person eating meals?: A Lot Help from another person taking care of personal grooming?: A Lot Help from another person toileting, which includes using toliet, bedpan, or urinal?: A Lot Help from another person bathing (including washing, rinsing, drying)?: A Lot Help from another person to put on and taking off regular upper body clothing?: A Lot Help from another person to put on and taking off regular lower body clothing?: Total 6 Click Score: 11   End of Session Equipment Utilized During Treatment: Gait belt;Rolling walker Nurse Communication: Mobility status;Precautions  Activity Tolerance: Patient tolerated treatment well Patient left: in chair;with call bell/phone within reach;with chair alarm set;with family/visitor present;Other (comment) (mittens to protect iv)  OT Visit Diagnosis: Unsteadiness on feet (R26.81);Muscle weakness (generalized) (M62.81)                Time: 2979-8921 OT Time  Calculation (min): 31 min Charges:  OT General Charges $OT Visit: 1 Visit OT Evaluation $OT Eval Moderate Complexity: 1 Mod   Brynn, OTR/L  Acute Rehabilitation Services Pager: [phone] Office: [phone] .   Jeri Modena 06/11/2020, 10:20 AM

## 2020-06-12 LAB — GLUCOSE, CAPILLARY
Glucose-Capillary: 165 mg/dL — ABNORMAL HIGH (ref 70–99)
Glucose-Capillary: 213 mg/dL — ABNORMAL HIGH (ref 70–99)
Glucose-Capillary: 213 mg/dL — ABNORMAL HIGH (ref 70–99)
Glucose-Capillary: 154 mg/dL — ABNORMAL HIGH (ref 70–99)

## 2020-06-12 NOTE — Plan of Care (Signed)

## 2020-06-12 NOTE — Progress Notes (Signed)
Occupational Therapy Treatment Patient Details Name: Sonya George MRN: [id number] DOB: 04-13-1956 Today's Date: 06/12/2020    History of present illness 64 y.o. female admitted with AMS with difficulty communicating. 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 pt noncompliant spits out medications PMH CVA HTN DM seizures   OT comments  Pt making gradual progress towards OT goals this session. Pt was able to wash face from recliner with max multimodal cues and initial hand over hand assist. Pt nonverbal during session but was not resistant to movement. Pt currently requires MAX A for bed mobility, MOD A +2 to stand and MAX A +2 to pivot to recliner. Pt restless during session and sporadically noted to reach out for unknown items. Continue to recommend DC to SNF based on the level of assist pt is currently needing as well as her inability to follow commands. Will continue to follow acutely per POC.    Follow Up Recommendations  SNF;Other (comment) (family to d/c home with pt so HHOT)    Equipment Recommendations  3 in 1 bedside commode;Wheelchair (measurements OT);Wheelchair cushion (measurements OT)    Recommendations for Other Services      Precautions / Restrictions Precautions Precautions: Fall Precaution Comments: Air cabin crew Restrictions Weight Bearing Restrictions: No       Mobility Bed Mobility Overal bed mobility: Needs Assistance Bed Mobility: Supine to Sit     Supine to sit: Max assist;HOB elevated     General bed mobility comments: pt requried MAX A +1 for bed mobility needing assist to maneuver BLEs to EOB as pt not making any effort to assist. MAX A +1 to elevate trunk and scoot hips to EOB with assist from bed pad  Transfers Overall transfer level: Needs assistance Equipment used: 2 person hand held assist Transfers: Sit to/from [facility] Sit to Stand: Mod assist;+2 physical assistance Stand pivot transfers: Max assist;+2  physical assistance       General transfer comment: pt able to power into standing with MOD A +2, provided tactile cues of shifting weight anteriorly in prep for stand and pt seemed to respond well to cues. MAX A +2 to pivot to recliner as pt needed tactile cues to BLEs to initiate pivotal steps to recliner    Balance Overall balance assessment: Needs assistance Sitting-balance support: Bilateral upper extremity supported;Feet supported Sitting balance-Leahy Scale: Poor Sitting balance - Comments: pt ulitmately requried at least [facility]  for sitting balance however brief periods of minguard for balance with BUEs supported on EOB   Standing balance support: Bilateral upper extremity supported Standing balance-Leahy Scale: Poor Standing balance comment: pt required external assist                           ADL either performed or assessed with clinical judgement   ADL Overall ADL's : Needs assistance/impaired     Grooming: Wash/dry face;Sitting;Maximal assistance Grooming Details (indicate cue type and reason): pt able to wash face from recliner with MAX tactile cues and initial hand over hand assist     Lower Body Bathing: Total assistance Lower Body Bathing Details (indicate cue type and reason): total A for simulated LB bathing tasks with pt needing total A to apply lotion to BLEs from long sitting in chair         Toilet Transfer: Maximal assistance;+2 for physical assistance;Stand-pivot Toilet Transfer Details (indicate cue type and reason): MAX A +2 for simulated toilet transfer from EOB>recliner, pt not  following commands needing tactile cues to initiate pivotal steps to recliner         Functional mobility during ADLs: Maximal assistance;+2 for physical assistance (stand pivot only) General ADL Comments: pt mostly limted by cogntive deficits as pt unable to follow commands, pt nonverbal during session but does smile and reach out for items in room.     Vision    Additional Comments: vision continues to be difficult to assess secondary to cognitive deficits however pt continues to prefer R gaze but will track stimulus to L with tactile facilitation   Perception     Praxis      Cognition Arousal/Alertness: Awake/alert;Lethargic (moments of lethargy and moments of wakefulness) Behavior During Therapy: Restless (reaching out for items and pulling at purewick tube, ordered activity mat) Overall Cognitive Status: Difficult to assess Area of Impairment: Attention;Safety/judgement;Following commands                   Current Attention Level: Selective   Following Commands: Follows one step commands inconsistently Safety/Judgement: Decreased awareness of safety;Decreased awareness of deficits     General Comments: pt not following any commands but is not resistant to movement this session, more receptive this sesssion with increased time and tactile cues        Exercises     Shoulder Instructions       General Comments requested activity mat to be order as pt impulsively reaching for items    Pertinent Vitals/ Pain       Pain Assessment: Faces Faces Pain Scale: No hurt  Home Living                                          Prior Functioning/Environment              Frequency  Min 2X/week        Progress Toward Goals  OT Goals(current goals can now be found in the care plan section)  Progress towards OT goals: Progressing toward goals  Acute Rehab OT Goals Patient Stated Goal: none stated by patient OT Goal Formulation: With family Time For Goal Achievement: 06/25/20 Potential to Achieve Goals: Good  Plan Discharge plan remains appropriate;Frequency remains appropriate    Co-evaluation                 AM-PAC OT "6 Clicks" Daily Activity     Outcome Measure   Help from another person eating meals?: A Lot Help from another person taking care of personal grooming?: A Lot Help from  another person toileting, which includes using toliet, bedpan, or urinal?: Total Help from another person bathing (including washing, rinsing, drying)?: A Lot Help from another person to put on and taking off regular upper body clothing?: A Lot Help from another person to put on and taking off regular lower body clothing?: Total 6 Click Score: 10    End of Session Equipment Utilized During Treatment: Gait belt  OT Visit Diagnosis: Unsteadiness on feet (R26.81);Muscle weakness (generalized) (M62.81)   Activity Tolerance Patient tolerated treatment well   Patient Left in chair;with call bell/phone within reach;with nursing/sitter in room   Nurse Communication Mobility status        Time: 9470-9628 OT Time Calculation (min): 26 min  Charges: OT General Charges $OT Visit: 1 Visit OT Treatments $Self Care/Home Management : 23-37 mins  Corinne Ports C., COTA/L Acute Rehabilitation  Services [phone] [place] 06/12/2020, 10:02 AM

## 2020-06-12 NOTE — Progress Notes (Signed)
PROGRESS NOTE        PATIENT DETAILS Name: Sonya George Age: 64 y.o. Sex: female Date of Birth: Aug 30, 1955 Admit Date: 06/07/2020 Admitting Physician Reubin Milan, MD JOI:NOMVEH, Delfino Lovett, NP  Brief Narrative: Patient is a 64 y.o. female with past medical history of HTN, DM, prior CVA-who presented with altered mental status after getting news of her sister's passing.  She was admitted for further work-up-extensive neuroimaging/EEG negative-evaluated by both neurology and psychiatry-thought to have functional/stress-induced etiology for altered mental status.  Significant events: 11/6>> admit to [facility] for confusion  Significant studies: 11/6>> CT head: No acute intracranial abnormality. 11/6>> chest x-ray: No active pulmonary disease. 11/7>> RPR: Nonreactive 11/7>> vitamin B12: 561 11/7>> EEG: Mild diffuse encephalopathy-nonspecific etiology-no seizures.  Antimicrobial therapy: None  Microbiology data: None  Procedures : None  Consults: Neurology, psychiatry  DVT Prophylaxis : enoxaparin (LOVENOX) injection 40 mg Start: 06/07/20 2100  Subjective: Sitting in bedside chair-eating-patient care tech feeding her-she seems to be following commands and eating without any major issues.  She acknowledge me-whispered something-track my movement-but otherwise really did not speak.  Spoke with daughter over the phone this morning-she claims that the patient told her that she wanted to go home to [place] has apparently brought a ticket for this coming Sunday.  Assessment/Plan: Acute confusional state: Thought to be functional-stress response to her sister's passing.  Neuroimaging/EEG and other work-up as above.  Neurology/psychiatry evaluation completed-both services have signed off.  Slowly improving-she is following some commands-eating well per family and patient tech.  Need to make sure she is able to ambulate-but overall much better-and  suspect that if this trend continues-could be discharged in the next day or 2.  Continue supportive care-reassurance provided-remains on Seroquel.  History of CVA: Resume aspirin and statin.  Reviewed prior discharge summary-was only supposed to be on aspirin/Plavix for 3 weeks.  HLD: Continue statin  HTN: BP stable-continue amlodipine-lisinopril remains on hold.   DM-2 (A1c 6.9 on 11/8): CBG stable-continue SSI  Recent Labs    06/11/20 1647 06/11/20 2114 06/12/20 0608  GLUCAP 121* 179* 165*    Diet: Diet Order            Diet heart healthy/carb modified Room service appropriate? Yes; Fluid consistency: Thin  Diet effective now                  Code Status: Full code   Family Communication: Daughter [name] [phone]) over the phone on 11/11.  Disposition Plan: Status is: Inpatient  Remains inpatient appropriate because:Inpatient level of care appropriate due to severity of illness   Dispo: The patient is from: Home              Anticipated d/c is to: TBD              Anticipated d/c date is: 2 days              Patient currently is not medically stable to d/c.   Barriers to Discharge: Continued altered mental status-not yet at baseline.  Antimicrobial agents: Anti-infectives (From admission, onward)   None       Time spent: 25- minutes-Greater than 50% of this time was spent in counseling, explanation of diagnosis, planning of further management, and coordination of care.  MEDICATIONS: Scheduled Meds: . amLODipine  10 mg Oral Daily  . aspirin EC  81 mg Oral Daily  . atorvastatin  40 mg Oral q1800  . enoxaparin (LOVENOX) injection  40 mg Subcutaneous Q24H  . insulin aspart  0-9 Units Subcutaneous TID WC  . multivitamin with minerals  1 tablet Oral Daily  . QUEtiapine  25 mg Oral QHS   Continuous Infusions: PRN Meds:.acetaminophen **OR** acetaminophen, diphenhydrAMINE, haloperidol lactate, labetalol, LORazepam, ondansetron **OR** ondansetron  (ZOFRAN) IV   PHYSICAL EXAM: Vital signs: Vitals:   06/11/20 1913 06/11/20 2341 06/12/20 0359 06/12/20 0834  BP: 129/83 118/66 130/86 107/73  Pulse: 100 96 94 95  Resp: 17 17 15 20   Temp: 98.5 F (36.9 C) 98.7 F (37.1 C) (!) 97.5 F (36.4 C) 98.4 F (36.9 C)  TempSrc: Oral Oral Oral Axillary  SpO2: 98% 100% 97% 99%  Weight:      Height:       Filed Weights   06/07/20 0840  Weight: 63.5 kg   Body mass index is 22.6 kg/m.   Gen Exam: Much more,-alert-tracking my movement-appears to follow some commands. HEENT:atraumatic, normocephalic Chest: B/L clear to auscultation anteriorly CVS:S1S2 regular Abdomen:soft non tender, non distended Extremities:no edema Neurology: Generalized weakness-but appears nonfocal.   Skin: no rash  I have personally reviewed following labs and imaging studies  LABORATORY DATA: CBC: Recent Labs  Lab 06/07/20 0901 06/09/20 1346  WBC 7.7 9.5  NEUTROABS 4.9  --   HGB 12.6 14.1  HCT 38.2 41.4  MCV 91.0 87.9  PLT 250 466    Basic Metabolic Panel: Recent Labs  Lab 06/07/20 0901 06/08/20 1302 06/09/20 1346  NA 139  --  137  K 3.8  --  3.9  CL 102  --  102  CO2 27  --  24  GLUCOSE 166*  --  244*  BUN 9  --  5*  CREATININE 0.71  --  0.67  CALCIUM 9.9  --  9.8  MG  --  1.8  --   PHOS  --  2.5  --     GFR: Estimated Creatinine Clearance: 65.6 mL/min (by C-G formula based on SCr of 0.67 mg/dL).  Liver Function Tests: Recent Labs  Lab 06/07/20 0901 06/09/20 1346  AST 40 48*  ALT 20 26  ALKPHOS 73 73  BILITOT 0.9 1.0  PROT 7.2 7.3  ALBUMIN 3.7 3.6   No results for input(s): LIPASE, AMYLASE in the last 168 hours. Recent Labs  Lab 06/07/20 0901 06/08/20 1302  AMMONIA 29 17    Coagulation Profile: No results for input(s): INR, PROTIME in the last 168 hours.  Cardiac Enzymes: No results for input(s): CKTOTAL, CKMB, CKMBINDEX, TROPONINI in the last 168 hours.  BNP (last 3 results) No results for input(s): PROBNP  in the last 8760 hours.  Lipid Profile: No results for input(s): CHOL, HDL, LDLCALC, TRIG, CHOLHDL, LDLDIRECT in the last 72 hours.  Thyroid Function Tests: No results for input(s): TSH, T4TOTAL, FREET4, T3FREE, THYROIDAB in the last 72 hours.  Anemia Panel: No results for input(s): VITAMINB12, FOLATE, FERRITIN, TIBC, IRON, RETICCTPCT in the last 72 hours.  Urine analysis:    Component Value Date/Time   COLORURINE STRAW (A) 06/08/2020 1349   APPEARANCEUR CLEAR 06/08/2020 1349   LABSPEC 1.015 06/08/2020 1349   PHURINE 8.0 06/08/2020 1349   GLUCOSEU 100 (A) 06/08/2020 1349   HGBUR NEGATIVE 06/08/2020 1349   BILIRUBINUR NEGATIVE 06/08/2020 1349   KETONESUR 15 (A) 06/08/2020 1349   PROTEINUR NEGATIVE 06/08/2020 1349   NITRITE NEGATIVE 06/08/2020 1349   LEUKOCYTESUR  NEGATIVE 06/08/2020 1349    Sepsis Labs: Lactic Acid, Venous No results found for: LATICACIDVEN  MICROBIOLOGY: Recent Results (from the past 240 hour(s))  Respiratory Panel by RT PCR (Flu A&B, Covid) - Nasopharyngeal Swab     Status: None   Collection Time: 06/07/20 10:39 AM   Specimen: Nasopharyngeal Swab  Result Value Ref Range Status   SARS Coronavirus 2 by RT PCR NEGATIVE NEGATIVE Final    Comment: (NOTE) SARS-CoV-2 target nucleic acids are NOT DETECTED.  The SARS-CoV-2 RNA is generally detectable in upper respiratoy specimens during the acute phase of infection. The lowest concentration of SARS-CoV-2 viral copies this assay can detect is 131 copies/mL. A negative result does not preclude SARS-Cov-2 infection and should not be used as the sole basis for treatment or other patient management decisions. A negative result may occur with  improper specimen collection/handling, submission of specimen other than nasopharyngeal swab, presence of viral mutation(s) within the areas targeted by this assay, and inadequate number of viral copies (<131 copies/mL). A negative result must be combined with  clinical observations, patient history, and epidemiological information. The expected result is Negative.  Fact Sheet for Patients:  PinkCheek.be  Fact Sheet for Healthcare Providers:  GravelBags.it  This test is no t yet approved or cleared by the [place] FDA and  has been authorized for detection and/or diagnosis of SARS-CoV-2 by FDA under an Emergency Use Authorization (EUA). This EUA will remain  in effect (meaning this test can be used) for the duration of the COVID-19 declaration under Section 564(b)(1) of the Act, 21 U.S.C. section 360bbb-3(b)(1), unless the authorization is terminated or revoked sooner.     Influenza A by PCR NEGATIVE NEGATIVE Final   Influenza B by PCR NEGATIVE NEGATIVE Final    Comment: (NOTE) The Xpert Xpress SARS-CoV-2/FLU/RSV assay is intended as an aid in  the diagnosis of influenza from Nasopharyngeal swab specimens and  should not be used as a sole basis for treatment. Nasal washings and  aspirates are unacceptable for Xpert Xpress SARS-CoV-2/FLU/RSV  testing.  Fact Sheet for Patients: PinkCheek.be  Fact Sheet for Healthcare Providers: GravelBags.it  This test is not yet approved or cleared by the [place] FDA and  has been authorized for detection and/or diagnosis of SARS-CoV-2 by  FDA under an Emergency Use Authorization (EUA). This EUA will remain  in effect (meaning this test can be used) for the duration of the  Covid-19 declaration under Section 564(b)(1) of the Act, 21  U.S.C. section 360bbb-3(b)(1), unless the authorization is  terminated or revoked. Performed at [facility], [place] [address] [place], [place] [phone]     RADIOLOGY STUDIES/RESULTS: No results found.   LOS: 4 days   Oren Binet, MD  [facility]    To contact the attending provider between 7A-7P or the covering  provider during after hours 7P-7A, please log into the web site [email or web address] and access using universal [place] password for that web site. If you do not have the password, please call the hospital operator.  06/12/2020, 10:14 AM

## 2020-06-12 NOTE — Progress Notes (Signed)
Physical Therapy Treatment Patient Details Name: Sonya George MRN: [id number] DOB: 06-05-1956 Today's Date: 06/12/2020    History of Present Illness 64 y.o. female admitted with AMS with difficulty communicating. 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 pt noncompliant spits out medications PMH CVA HTN DM seizures    PT Comments    Pt tolerates treatment well however she is unable to consistently follow tactile, visual, or verbal cues/commands. PT defers use of interpreter as pt has had difficulty with communication with interpreter 2/2 aphasia due to recent stroke. Pt demonstrates very poor awareness of deficits and is impulsive at times during session, attempting to stand without PT assist and scooting very close to edge of bed multiple times. This PT found the pt sliding forward off toward the edge of recliner yesterday and safety sitter reports the same incident occurred today, likely 2/2 to intermittent extensor tone this PT observes during session today. Per discussion with OTA the pt's daughter has reported the desire to discharge home so they can fly to [place] for a family members funeral. The pt is at a very high risk of falling currently, continues to require significant assistance for all functional mobility, and has had difficulty safely sitting in recliner chair with hospital staff assistance for limited periods of time, let alone for an international flight. PT continues to recommend SNF placement at this time. If the family chooses to discharge home PT recommends the pt receive a hospital bed, wheelchair, mechanical lift, 3 in 1 commode, and a RW if not owned already. PT recommends against attempting international flight at this time as the pt is very likely to fall during transfer attempts, or slide out of seat onto the floor of the aircraft mid-flight.  Follow Up Recommendations  SNF;Supervision/Assistance - 24 hour     Equipment Recommendations  Wheelchair  (measurements PT);Wheelchair cushion (measurements PT);Hospital bed;3in1 (PT);Rolling walker with 5" wheels    Recommendations for Other Services       Precautions / Restrictions Precautions Precautions: Fall Precaution Comments: safety sitter Restrictions Weight Bearing Restrictions: No    Mobility  Bed Mobility Overal bed mobility: Needs Assistance Bed Mobility: Rolling;Sidelying to Sit;Sit to Supine Rolling: Max assist Sidelying to sit: Mod assist   Sit to supine: Total assist   General bed mobility comments: pt requires significant assistance to perform all functional mobility tasks at this time  Transfers Overall transfer level: Needs assistance Equipment used: Rolling walker (2 wheeled);1 person hand held assist Transfers: Sit to/from Stand Sit to Stand: Mod assist;Max assist         General transfer comment: pt requires maxA with PT UE support and R knee block, pt does complete one sit to stand with modA in which she initiates by utilizing UE support of RW, otherwise maxA for 6 sit to stand transfers  Ambulation/Gait Ambulation/Gait assistance:  (deferred 2/2 falls risk)               Stairs             Wheelchair Mobility    Modified Rankin (Stroke Patients Only)       Balance Overall balance assessment: Needs assistance Sitting-balance support: No upper extremity supported;Feet supported Sitting balance-Leahy Scale: Poor Sitting balance - Comments: minG without UE support   Standing balance support: Bilateral upper extremity supported Standing balance-Leahy Scale: Poor Standing balance comment: modA for one stand attempt with BUE support of RW, otherwise maxA  Cognition Arousal/Alertness: Awake/alert Behavior During Therapy: Restless Overall Cognitive Status: Difficult to assess                                        Exercises      General Comments General comments (skin  integrity, edema, etc.): VSS on RA      Pertinent Vitals/Pain Pain Assessment: Faces Faces Pain Scale: No hurt    Home Living                      Prior Function            PT Goals (current goals can now be found in the care plan section) Acute Rehab PT Goals Patient Stated Goal: per discussion with OTA pt's daughter plans to fly herself and the pt to [place] for sister's funeral Progress towards PT goals: Not progressing toward goals - comment (limited by communication and cognition)    Frequency    Min 3X/week      PT Plan Current plan remains appropriate    Co-evaluation              AM-PAC PT "6 Clicks" Mobility   Outcome Measure  Help needed turning from your back to your side while in a flat bed without using bedrails?: Total Help needed moving from lying on your back to sitting on the side of a flat bed without using bedrails?: A Lot Help needed moving to and from a bed to a chair (including a wheelchair)?: A Lot Help needed standing up from a chair using your arms (e.g., wheelchair or bedside chair)?: A Lot Help needed to walk in hospital room?: Total Help needed climbing 3-5 steps with a railing? : Total 6 Click Score: 9    End of Session Equipment Utilized During Treatment: Gait belt Activity Tolerance: Patient tolerated treatment well Patient left: in bed;with call bell/phone within reach;with bed alarm set;with nursing/sitter in room Nurse Communication: Mobility status PT Visit Diagnosis: Muscle weakness (generalized) (M62.81);Difficulty in walking, not elsewhere classified (R26.2);Other abnormalities of gait and mobility (R26.89);Unsteadiness on feet (R26.81)     Time: 0102-7253 PT Time Calculation (min) (ACUTE ONLY): 23 min  Charges:  $Therapeutic Activity: 23-37 mins                     Zenaida Niece, PT, DPT Acute Rehabilitation Pager: [phone]    Zenaida Niece 06/12/2020, 2:28 PM

## 2020-06-13 LAB — GLUCOSE, CAPILLARY
Glucose-Capillary: 170 mg/dL — ABNORMAL HIGH (ref 70–99)
Glucose-Capillary: 199 mg/dL — ABNORMAL HIGH (ref 70–99)
Glucose-Capillary: 260 mg/dL — ABNORMAL HIGH (ref 70–99)
Glucose-Capillary: 114 mg/dL — ABNORMAL HIGH (ref 70–99)

## 2020-06-13 NOTE — TOC Initial Note (Signed)
Transition of Care [facility]) - Initial/Assessment Note    Patient Details  Name: Sonya George MRN: [id number] Date of Birth: July 30, 1956  [facility]) CM/SW Contact:    Geralynn Ochs, LCSW Phone Number: 06/13/2020, 1:34 PM  Clinical Narrative:    CSW spoke with patient's daughter, who was asking about whether the patient can discharge before Sunday to take a flight back to [place]. CSW discussed with daughter that the patient would need a wheelchair but has no insurance, so will need to see if she will qualify for charity for DME. Also, daughter asked for patient's medications to be provided so she can take them with her. CSW discussed with MD, who does not agree that the patient is ready to get on an international flight at this time. Per MD, daughter has canceled the flight on Sunday. CSW to continue to monitor patient's progress for disposition needs when stable.               Expected Discharge Plan: [place] Barriers to Discharge: Continued Medical Work up, Inadequate or no insurance   Patient Goals and CMS Choice Patient states their goals for this hospitalization and ongoing recovery are:: patient unable to participate in goal setting due to disorientation CMS Medicare.gov Compare Post Acute Care list provided to:: Patient Represenative (must comment) Choice offered to / list presented to : Adult Children  Expected Discharge Plan and Services Expected Discharge Plan: [place] Choice: Durable Medical Equipment Living arrangements for the past 2 months: Single Family Home                                      Prior Living Arrangements/Services Living arrangements for the past 2 months: Single Family Home Lives with:: Adult Children Patient language and need for interpreter reviewed:: Yes Do you feel safe going back to the place where you live?: Yes      Need for Family Participation in Patient  Care: Yes (Comment) Care giver support system in place?: Yes (comment)   Criminal Activity/Legal Involvement Pertinent to Current Situation/Hospitalization: No - Comment as needed  Activities of Daily Living      Permission Sought/Granted Permission sought to share information with : Family Supports Permission granted to share information with : Yes, Verbal Permission Granted  Share Information with NAME: [name]     Permission granted to share info w Relationship: Daughter     Emotional Assessment   Attitude/Demeanor/Rapport: Unable to Assess Affect (typically observed): Unable to Assess   Alcohol / Substance Use: Not Applicable Psych Involvement: Yes (comment)  Admission diagnosis:  Agitation [R45.1] Acute encephalopathy [G93.40] Altered mental status, unspecified altered mental status type [R41.82] Patient Active Problem List   Diagnosis Date Noted  . Acute encephalopathy 06/07/2020  . Seizure ([place]) 06/07/2020  . Hyperlipidemia 06/07/2020  . CVA (cerebral vascular accident) ([place]) 03/29/2020  . Type 2 diabetes mellitus ([place]) 01/29/2019  . Essential hypertension 01/29/2019   PCP:  Maximiano Coss, NP Pharmacy:   [facility], [place] [address] Phone: [phone] Fax: [phone]     Social Determinants of Health (SDOH) Interventions    Readmission Risk Interventions No flowsheet data found.

## 2020-06-13 NOTE — Progress Notes (Signed)
PROGRESS NOTE        PATIENT DETAILS Name: Sonya George Age: 64 y.o. Sex: female Date of Birth: 1956/03/16 Admit Date: 06/07/2020 Admitting Physician Reubin Milan, MD [id number], Delfino Lovett, NP  Brief Narrative: Patient is a 64 y.o. female with past medical history of HTN, DM, prior CVA-who presented with altered mental status after getting news of her sister's passing.  She was admitted for further work-up-extensive neuroimaging/EEG negative-evaluated by both neurology and psychiatry-thought to have functional/stress-induced etiology for altered mental status.  Significant events: 11/6>> admit to [facility] for confusion  Significant studies: 11/6>> CT head: No acute intracranial abnormality. 11/6>> chest x-ray: No active pulmonary disease. 11/7>> RPR: Nonreactive 11/7>> vitamin B12: 561 11/7>> EEG: Mild diffuse encephalopathy-nonspecific etiology-no seizures.  Antimicrobial therapy: None  Microbiology data: None  Procedures : None  Consults: Neurology, psychiatry  DVT Prophylaxis : enoxaparin (LOVENOX) injection 40 mg Start: 06/07/20 2100  Subjective: Seen twice today-early this morning-she was sitting up in bed-not very verbal but mumbling something (speaks a dialect from [place]) but following most of my commands-shaking my hand-waving at me-and tracking my movements.   Subsequently seen in the latter half of the morning-daughter at bedside-she is more confused-not as alert as she was when I saw her earlier this morning.  Assessment/Plan: Acute confusional state: Thought to be functional-stress response to her sister's passing.  CT head did not show any acute abnormalities-EEG was negative for seizures.  Neurology/psychiatry evaluation completed-both services signed off-recommendations were for supportive care.  Per patient's daughter-she is somewhat better than how she first presented to the hospital-but clearly not at baseline-it seems that  the patient's mental status waxes and wanes.  She recently had a CVA-Per daughter-post CVA-her speech had significantly improved (able to converse in her native language) and she was basically able to care for self.  Given persistent confusion-waxing/waning mental status-recent CVA-we will go ahead and repeat a MRI of her brain.  Continue Seroquel-continue supportive care/reassurance.  Daughter was planning on taking her to [place] for her sister's funeral this coming Sunday-but given her current mental status-do not think it is feasible that she will be discharged from the hospital by then.  History of CVA: Continue aspirin and statin.  Reviewed prior discharge summary-was only supposed to be on aspirin/Plavix for 3 weeks.  HLD: Continue statin  HTN: BP stable-continue amlodipine-lisinopril remains on hold.   DM-2 (A1c 6.9 on 11/8): CBG stable-continue SSI  Recent Labs    06/12/20 2121 06/13/20 0615 06/13/20 1131  GLUCAP 154* 170* 199*    Diet: Diet Order            Diet heart healthy/carb modified Room service appropriate? Yes; Fluid consistency: Thin  Diet effective now                  Code Status: Full code   Family Communication: Daughter [name] [phone]) at bedside.  Disposition Plan: Status is: Inpatient  Remains inpatient appropriate because:Inpatient level of care appropriate due to severity of illness   Dispo: The patient is from: Home              Anticipated d/c is to: TBD              Anticipated d/c date is: 2 days              Patient currently is not medically  stable to d/c.   Barriers to Discharge: Continued altered mental status-not yet at baseline.  Antimicrobial agents: Anti-infectives (From admission, onward)   None       Time spent: 25- minutes-Greater than 50% of this time was spent in counseling, explanation of diagnosis, planning of further management, and coordination of care.  MEDICATIONS: Scheduled Meds: . amLODipine  10  mg Oral Daily  . aspirin EC  81 mg Oral Daily  . atorvastatin  40 mg Oral q1800  . enoxaparin (LOVENOX) injection  40 mg Subcutaneous Q24H  . insulin aspart  0-9 Units Subcutaneous TID WC  . multivitamin with minerals  1 tablet Oral Daily  . QUEtiapine  25 mg Oral QHS   Continuous Infusions: PRN Meds:.acetaminophen **OR** acetaminophen, diphenhydrAMINE, haloperidol lactate, labetalol, LORazepam, ondansetron **OR** ondansetron (ZOFRAN) IV   PHYSICAL EXAM: Vital signs: Vitals:   06/12/20 2000 06/13/20 0000 06/13/20 0716 06/13/20 1121  BP: 117/79 121/87 123/82 122/79  Pulse: 99 93 98 (!) 101  Resp: 18 20 20 18   Temp:  99 F (37.2 C) 97.8 F (36.6 C) 98.5 F (36.9 C)  TempSrc:  Oral Oral Oral  SpO2: 98% 99% 100% 96%  Weight:      Height:       Filed Weights   06/07/20 0840  Weight: 63.5 kg   Body mass index is 22.6 kg/m.   Gen Exam: Continues to be confused-see above. HEENT:atraumatic, normocephalic Chest: B/L clear to auscultation anteriorly CVS:S1S2 regular Abdomen:soft non tender, non distended Extremities:no edema Neurology: Non focal Skin: no rash  I have personally reviewed following labs and imaging studies  LABORATORY DATA: CBC: Recent Labs  Lab 06/07/20 0901 06/09/20 1346  WBC 7.7 9.5  NEUTROABS 4.9  --   HGB 12.6 14.1  HCT 38.2 41.4  MCV 91.0 87.9  PLT 250 597    Basic Metabolic Panel: Recent Labs  Lab 06/07/20 0901 06/08/20 1302 06/09/20 1346  NA 139  --  137  K 3.8  --  3.9  CL 102  --  102  CO2 27  --  24  GLUCOSE 166*  --  244*  BUN 9  --  5*  CREATININE 0.71  --  0.67  CALCIUM 9.9  --  9.8  MG  --  1.8  --   PHOS  --  2.5  --     GFR: Estimated Creatinine Clearance: 65.6 mL/min (by C-G formula based on SCr of 0.67 mg/dL).  Liver Function Tests: Recent Labs  Lab 06/07/20 0901 06/09/20 1346  AST 40 48*  ALT 20 26  ALKPHOS 73 73  BILITOT 0.9 1.0  PROT 7.2 7.3  ALBUMIN 3.7 3.6   No results for input(s): LIPASE,  AMYLASE in the last 168 hours. Recent Labs  Lab 06/07/20 0901 06/08/20 1302  AMMONIA 29 17    Coagulation Profile: No results for input(s): INR, PROTIME in the last 168 hours.  Cardiac Enzymes: No results for input(s): CKTOTAL, CKMB, CKMBINDEX, TROPONINI in the last 168 hours.  BNP (last 3 results) No results for input(s): PROBNP in the last 8760 hours.  Lipid Profile: No results for input(s): CHOL, HDL, LDLCALC, TRIG, CHOLHDL, LDLDIRECT in the last 72 hours.  Thyroid Function Tests: No results for input(s): TSH, T4TOTAL, FREET4, T3FREE, THYROIDAB in the last 72 hours.  Anemia Panel: No results for input(s): VITAMINB12, FOLATE, FERRITIN, TIBC, IRON, RETICCTPCT in the last 72 hours.  Urine analysis:    Component Value Date/Time   COLORURINE STRAW (A)  06/08/2020 [place] 06/08/2020 1349   LABSPEC 1.015 06/08/2020 1349   PHURINE 8.0 06/08/2020 1349   GLUCOSEU 100 (A) 06/08/2020 1349   HGBUR NEGATIVE 06/08/2020 1349   BILIRUBINUR NEGATIVE 06/08/2020 1349   KETONESUR 15 (A) 06/08/2020 1349   PROTEINUR NEGATIVE 06/08/2020 1349   NITRITE NEGATIVE 06/08/2020 1349   LEUKOCYTESUR NEGATIVE 06/08/2020 1349    Sepsis Labs: Lactic Acid, Venous No results found for: LATICACIDVEN  MICROBIOLOGY: Recent Results (from the past 240 hour(s))  Respiratory Panel by RT PCR (Flu A&B, Covid) - Nasopharyngeal Swab     Status: None   Collection Time: 06/07/20 10:39 AM   Specimen: Nasopharyngeal Swab  Result Value Ref Range Status   SARS Coronavirus 2 by RT PCR NEGATIVE NEGATIVE Final    Comment: (NOTE) SARS-CoV-2 target nucleic acids are NOT DETECTED.  The SARS-CoV-2 RNA is generally detectable in upper respiratoy specimens during the acute phase of infection. The lowest concentration of SARS-CoV-2 viral copies this assay can detect is 131 copies/mL. A negative result does not preclude SARS-Cov-2 infection and should not be used as the sole basis for treatment  or other patient management decisions. A negative result may occur with  improper specimen collection/handling, submission of specimen other than nasopharyngeal swab, presence of viral mutation(s) within the areas targeted by this assay, and inadequate number of viral copies (<131 copies/mL). A negative result must be combined with clinical observations, patient history, and epidemiological information. The expected result is Negative.  Fact Sheet for Patients:  PinkCheek.be  Fact Sheet for Healthcare Providers:  GravelBags.it  This test is no t yet approved or cleared by the [place] FDA and  has been authorized for detection and/or diagnosis of SARS-CoV-2 by FDA under an Emergency Use Authorization (EUA). This EUA will remain  in effect (meaning this test can be used) for the duration of the COVID-19 declaration under Section 564(b)(1) of the Act, 21 U.S.C. section 360bbb-3(b)(1), unless the authorization is terminated or revoked sooner.     Influenza A by PCR NEGATIVE NEGATIVE Final   Influenza B by PCR NEGATIVE NEGATIVE Final    Comment: (NOTE) The Xpert Xpress SARS-CoV-2/FLU/RSV assay is intended as an aid in  the diagnosis of influenza from Nasopharyngeal swab specimens and  should not be used as a sole basis for treatment. Nasal washings and  aspirates are unacceptable for Xpert Xpress SARS-CoV-2/FLU/RSV  testing.  Fact Sheet for Patients: PinkCheek.be  Fact Sheet for Healthcare Providers: GravelBags.it  This test is not yet approved or cleared by the [place] FDA and  has been authorized for detection and/or diagnosis of SARS-CoV-2 by  FDA under an Emergency Use Authorization (EUA). This EUA will remain  in effect (meaning this test can be used) for the duration of the  Covid-19 declaration under Section 564(b)(1) of the Act, 21  U.S.C.  section 360bbb-3(b)(1), unless the authorization is  terminated or revoked. Performed at [facility], [place] [address], [place], [place] [address]     RADIOLOGY STUDIES/RESULTS: No results found.   LOS: 5 days   Oren Binet, MD  [facility]    To contact the attending provider between 7A-7P or the covering provider during after hours 7P-7A, please log into the web site [email or web address] and access using universal [place] password for that web site. If you do not have the password, please call the hospital operator.  06/13/2020, 3:10 PM

## 2020-06-13 NOTE — Progress Notes (Signed)
PT Cancellation Note  Patient Details Name: Sonya George MRN: [id number] DOB: [birth year]   Cancelled Treatment:    Reason Eval/Treat Not Completed: Patient at procedure or test/unavailable. PT attempted to see pt for treatment however pt was off unit at MRI. PT will attempt to follow up as time allows.   Zenaida Niece 06/13/2020, 12:55 PM

## 2020-06-14 ENCOUNTER — Inpatient Hospital Stay (HOSPITAL_COMMUNITY): Payer: Medicaid Other

## 2020-06-14 LAB — GLUCOSE, CAPILLARY
Glucose-Capillary: 166 mg/dL — ABNORMAL HIGH (ref 70–99)
Glucose-Capillary: 236 mg/dL — ABNORMAL HIGH (ref 70–99)
Glucose-Capillary: 146 mg/dL — ABNORMAL HIGH (ref 70–99)
Glucose-Capillary: 148 mg/dL — ABNORMAL HIGH (ref 70–99)

## 2020-06-14 LAB — COMPREHENSIVE METABOLIC PANEL
ALT: 20 U/L (ref 0–44)
AST: 27 U/L (ref 15–41)
Albumin: 3.2 g/dL — ABNORMAL LOW (ref 3.5–5.0)
Alkaline Phosphatase: 59 U/L (ref 38–126)
Anion gap: 11 (ref 5–15)
BUN: 14 mg/dL (ref 8–23)
CO2: 25 mmol/L (ref 22–32)
Calcium: 9.7 mg/dL (ref 8.9–10.3)
Chloride: 104 mmol/L (ref 98–111)
Creatinine, Ser: 0.79 mg/dL (ref 0.44–1.00)
GFR, Estimated: >60 mL/min (ref >60)
Glucose, Bld: 154 mg/dL — ABNORMAL HIGH (ref 70–99)
Potassium: 3.5 mmol/L (ref 3.5–5.1)
Sodium: 140 mmol/L (ref 135–145)
Total Bilirubin: 0.9 mg/dL (ref 0.3–1.2)
Total Protein: 6.5 g/dL (ref 6.5–8.1)

## 2020-06-14 LAB — CBC
HCT: 41.3 % (ref 36.0–46.0)
Hemoglobin: 13.7 g/dL (ref 12.0–15.0)
MCH: 29.7 pg (ref 26.0–34.0)
MCHC: 33.2 g/dL (ref 30.0–36.0)
MCV: 89.4 fL (ref 80.0–100.0)
Platelets: 259 10*3/uL (ref 150–400)
RBC: 4.62 MIL/uL (ref 3.87–5.11)
RDW: 14.1 % (ref 11.5–15.5)
WBC: 8.4 10*3/uL (ref 4.0–10.5)
nRBC: 0 % (ref 0.0–0.2)

## 2020-06-14 LAB — AMMONIA: Ammonia: 15 umol/L (ref 9–35)

## 2020-06-14 MED ORDER — LORAZEPAM 2 MG/ML IJ SOLN
1.0000 mg | Freq: Once | INTRAMUSCULAR | Status: AC | PRN
  Administered 2020-06-14: 1 mg via INTRAVENOUS
  Filled 2020-06-14: qty 1

## 2020-06-14 MED ORDER — QUETIAPINE FUMARATE 50 MG PO TABS
50.0000 mg | ORAL_TABLET | Freq: Every day | ORAL | Status: DC
  Administered 2020-06-14 – 2020-06-23 (×9): 50 mg via ORAL
  Filled 2020-06-14 (×11): qty 1

## 2020-06-14 NOTE — Progress Notes (Signed)
PROGRESS NOTE        PATIENT DETAILS Name: Sonya George Age: 64 y.o. Sex: female Date of Birth: 20-Apr-1956 Admit Date: 06/07/2020 Admitting Physician Reubin Milan, MD [id number], Delfino Lovett, NP  Brief Narrative: Patient is a 64 y.o. female with past medical history of HTN, DM, prior CVA-who presented with altered mental status after getting news of her sister's passing.  She was admitted for further work-up-extensive neuroimaging/EEG negative-evaluated by both neurology and psychiatry-thought to have functional/stress-induced etiology for altered mental status.  Significant events: 11/6>> admit to [facility] for confusion  Significant studies: 11/6>> CT head: No acute intracranial abnormality. 11/6>> chest x-ray: No active pulmonary disease. 11/7>> RPR: Nonreactive 11/7>> vitamin B12: 561 11/7>> EEG: Mild diffuse encephalopathy-nonspecific etiology-no seizures.  Antimicrobial therapy: None  Microbiology data: None  Procedures : None  Consults: Neurology, psychiatry  DVT Prophylaxis : enoxaparin (LOVENOX) injection 40 mg Start: 06/07/20 2100  Subjective: Had more confusion last night required Haldol and Ativan.  Mumbles something incoherently.  Assessment/Plan: Acute confusional state: Thought to be functional-stress response to her sister's passing.  CT head did not show any acute abnormalities-EEG was negative for seizures.  Neurology/psychiatry evaluation completed-both services signed off-recommendations were for supportive care.  Per patient's daughter-she is somewhat better than how she first presented to the hospital-but clearly not at baseline-it seems that the patient's mental status waxes and wanes.  Her speech continues to be very soft and mumbles-she really has not phonating a word for me-her daughter claims that at times she was able to communicate (in her native language) with the patient over the past few days.    She recently had a  CVA-Per daughter-post CVA-her speech had significantly improved (able to converse in her native language but apparently had forgotten English after CVA) and she was basically able to care for self.  Given persistent confusion-waxing/waning mental status-ongoing issues with speech-recent CVA-MRI ordered on 11/12-still pending.  In the meantime-increase Seroquel to 50 mg nightly.  History of CVA: Continue aspirin and statin.  Reviewed prior discharge summary-was only supposed to be on aspirin/Plavix for 3 weeks.  HLD: Continue statin  HTN: BP stable-continue amlodipine-lisinopril remains on hold.   DM-2 (A1c 6.9 on 11/8): CBG stable-continue SSI  Recent Labs    06/13/20 2138 06/14/20 0621 06/14/20 1137  GLUCAP 114* 166* 236*    Diet: Diet Order            Diet heart healthy/carb modified Room service appropriate? Yes; Fluid consistency: Thin  Diet effective now                  Code Status: Full code   Family Communication: Daughter [name] [phone]) over the phone on 11/13  Disposition Plan: Status is: Inpatient  Remains inpatient appropriate because:Inpatient level of care appropriate due to severity of illness   Dispo: The patient is from: Home              Anticipated d/c is to: TBD              Anticipated d/c date is: 2 days              Patient currently is not medically stable to d/c.   Barriers to Discharge: Continued altered mental status-not yet at baseline.  Antimicrobial agents: Anti-infectives (From admission, onward)   None  Time spent: 25- minutes-Greater than 50% of this time was spent in counseling, explanation of diagnosis, planning of further management, and coordination of care.  MEDICATIONS: Scheduled Meds: . amLODipine  10 mg Oral Daily  . aspirin EC  81 mg Oral Daily  . atorvastatin  40 mg Oral q1800  . enoxaparin (LOVENOX) injection  40 mg Subcutaneous Q24H  . insulin aspart  0-9 Units Subcutaneous TID WC  .  multivitamin with minerals  1 tablet Oral Daily  . QUEtiapine  25 mg Oral QHS   Continuous Infusions: PRN Meds:.acetaminophen **OR** acetaminophen, diphenhydrAMINE, haloperidol lactate, labetalol, LORazepam, ondansetron **OR** ondansetron (ZOFRAN) IV   PHYSICAL EXAM: Vital signs: Vitals:   06/14/20 0008 06/14/20 0420 06/14/20 0727 06/14/20 1143  BP: 118/77 101/68 113/69 123/65  Pulse: (!) 108 80 84 99  Resp: 18 18 18 18   Temp: 98.7 F (37.1 C) 97.9 F (36.6 C) 97.7 F (36.5 C) 98.2 F (36.8 C)  TempSrc: Oral Oral Oral Oral  SpO2: 99% 98% 94% 100%  Weight:      Height:       Filed Weights   06/07/20 0840  Weight: 63.5 kg   Body mass index is 22.6 kg/m.   Gen Exam: Confused but not in any distress HEENT:atraumatic, normocephalic Chest: B/L clear to auscultation anteriorly CVS:S1S2 regular Abdomen:soft non tender, non distended Extremities:no edema Neurology: Non focal Skin: no rash  I have personally reviewed following labs and imaging studies  LABORATORY DATA: CBC: Recent Labs  Lab 06/09/20 1346 06/14/20 0420  WBC 9.5 8.4  HGB 14.1 13.7  HCT 41.4 41.3  MCV 87.9 89.4  PLT 319 606    Basic Metabolic Panel: Recent Labs  Lab 06/08/20 1302 06/09/20 1346 06/14/20 0420  NA  --  137 140  K  --  3.9 3.5  CL  --  102 104  CO2  --  24 25  GLUCOSE  --  244* 154*  BUN  --  5* 14  CREATININE  --  0.67 0.79  CALCIUM  --  9.8 9.7  MG 1.8  --   --   PHOS 2.5  --   --     GFR: Estimated Creatinine Clearance: 65.6 mL/min (by C-G formula based on SCr of 0.79 mg/dL).  Liver Function Tests: Recent Labs  Lab 06/09/20 1346 06/14/20 0420  AST 48* 27  ALT 26 20  ALKPHOS 73 59  BILITOT 1.0 0.9  PROT 7.3 6.5  ALBUMIN 3.6 3.2*   No results for input(s): LIPASE, AMYLASE in the last 168 hours. Recent Labs  Lab 06/08/20 1302 06/14/20 0420  AMMONIA 17 15    Coagulation Profile: No results for input(s): INR, PROTIME in the last 168 hours.  Cardiac  Enzymes: No results for input(s): CKTOTAL, CKMB, CKMBINDEX, TROPONINI in the last 168 hours.  BNP (last 3 results) No results for input(s): PROBNP in the last 8760 hours.  Lipid Profile: No results for input(s): CHOL, HDL, LDLCALC, TRIG, CHOLHDL, LDLDIRECT in the last 72 hours.  Thyroid Function Tests: No results for input(s): TSH, T4TOTAL, FREET4, T3FREE, THYROIDAB in the last 72 hours.  Anemia Panel: No results for input(s): VITAMINB12, FOLATE, FERRITIN, TIBC, IRON, RETICCTPCT in the last 72 hours.  Urine analysis:    Component Value Date/Time   COLORURINE STRAW (A) 06/08/2020 1349   APPEARANCEUR CLEAR 06/08/2020 1349   LABSPEC 1.015 06/08/2020 1349   PHURINE 8.0 06/08/2020 1349   GLUCOSEU 100 (A) 06/08/2020 1349   HGBUR NEGATIVE 06/08/2020 1349  BILIRUBINUR NEGATIVE 06/08/2020 1349   KETONESUR 15 (A) 06/08/2020 1349   PROTEINUR NEGATIVE 06/08/2020 1349   NITRITE NEGATIVE 06/08/2020 1349   LEUKOCYTESUR NEGATIVE 06/08/2020 1349    Sepsis Labs: Lactic Acid, Venous No results found for: LATICACIDVEN  MICROBIOLOGY: Recent Results (from the past 240 hour(s))  Respiratory Panel by RT PCR (Flu A&B, Covid) - Nasopharyngeal Swab     Status: None   Collection Time: 06/07/20 10:39 AM   Specimen: Nasopharyngeal Swab  Result Value Ref Range Status   SARS Coronavirus 2 by RT PCR NEGATIVE NEGATIVE Final    Comment: (NOTE) SARS-CoV-2 target nucleic acids are NOT DETECTED.  The SARS-CoV-2 RNA is generally detectable in upper respiratoy specimens during the acute phase of infection. The lowest concentration of SARS-CoV-2 viral copies this assay can detect is 131 copies/mL. A negative result does not preclude SARS-Cov-2 infection and should not be used as the sole basis for treatment or other patient management decisions. A negative result may occur with  improper specimen collection/handling, submission of specimen other than nasopharyngeal swab, presence of viral mutation(s)  within the areas targeted by this assay, and inadequate number of viral copies (<131 copies/mL). A negative result must be combined with clinical observations, patient history, and epidemiological information. The expected result is Negative.  Fact Sheet for Patients:  PinkCheek.be  Fact Sheet for Healthcare Providers:  GravelBags.it  This test is no t yet approved or cleared by the [place] FDA and  has been authorized for detection and/or diagnosis of SARS-CoV-2 by FDA under an Emergency Use Authorization (EUA). This EUA will remain  in effect (meaning this test can be used) for the duration of the COVID-19 declaration under Section 564(b)(1) of the Act, 21 U.S.C. section 360bbb-3(b)(1), unless the authorization is terminated or revoked sooner.     Influenza A by PCR NEGATIVE NEGATIVE Final   Influenza B by PCR NEGATIVE NEGATIVE Final    Comment: (NOTE) The Xpert Xpress SARS-CoV-2/FLU/RSV assay is intended as an aid in  the diagnosis of influenza from Nasopharyngeal swab specimens and  should not be used as a sole basis for treatment. Nasal washings and  aspirates are unacceptable for Xpert Xpress SARS-CoV-2/FLU/RSV  testing.  Fact Sheet for Patients: PinkCheek.be  Fact Sheet for Healthcare Providers: GravelBags.it  This test is not yet approved or cleared by the [place] FDA and  has been authorized for detection and/or diagnosis of SARS-CoV-2 by  FDA under an Emergency Use Authorization (EUA). This EUA will remain  in effect (meaning this test can be used) for the duration of the  Covid-19 declaration under Section 564(b)(1) of the Act, 21  U.S.C. section 360bbb-3(b)(1), unless the authorization is  terminated or revoked. Performed at [facility], [place] [address] [place], [place] [address]     RADIOLOGY STUDIES/RESULTS: No results  found.   LOS: 6 days   Oren Binet, MD  [facility]    To contact the attending provider between 7A-7P or the covering provider during after hours 7P-7A, please log into the web site [email or web address] and access using universal [place] password for that web site. If you do not have the password, please call the hospital operator.  06/14/2020, 1:49 PM

## 2020-06-15 DIAGNOSIS — I6930 Unspecified sequelae of cerebral infarction: Secondary | ICD-10-CM

## 2020-06-15 DIAGNOSIS — R4789 Other speech disturbances: Secondary | ICD-10-CM

## 2020-06-15 DIAGNOSIS — I639 Cerebral infarction, unspecified: Secondary | ICD-10-CM

## 2020-06-15 LAB — GLUCOSE, CAPILLARY
Glucose-Capillary: 163 mg/dL — ABNORMAL HIGH (ref 70–99)
Glucose-Capillary: 211 mg/dL — ABNORMAL HIGH (ref 70–99)
Glucose-Capillary: 112 mg/dL — ABNORMAL HIGH (ref 70–99)
Glucose-Capillary: 195 mg/dL — ABNORMAL HIGH (ref 70–99)

## 2020-06-15 LAB — VITAMIN B1: Vitamin B1 (Thiamine): 171.6 nmol/L (ref 66.5–200.0)

## 2020-06-15 NOTE — Consult Note (Signed)
Neurology Consult H&P  CC: Stroke  History is obtained from: chart  HPI: Sonya George is a 64 y.o. female HTN, DM, recent stroke with complete workup 03/2020 returns with imaging showing new subacute stroke. History is difficult as the patient is speaking with foreign language likely her native tongue.   LKW: unclear tpa given?: No, not a candidate IR Thrombectomy? No,  {Modified Rankin Scale unclear NIHSS: 14  ROS: Unable to assess due to encephalopathy.   Past Medical History:  Diagnosis Date  . Diabetes mellitus without complication ([place])   . Hyperlipidemia 06/07/2020  . Hypertension   . Stroke [facility])    Family History  Problem Relation Age of Onset  . Stroke Sister        Actively dying of CVA  . Stroke Brother        Died 2 weeks ago of CVA    Social History:  reports that she has never smoked. She has never used smokeless tobacco. She reports that she does not drink alcohol and does not use drugs.   Prior to Admission medications   Medication Sig Start Date End Date Taking? Authorizing Provider  acetaminophen (TYLENOL) 500 MG tablet Take 1,000 mg by mouth every 6 (six) hours as needed for headache (pain).   Yes [provider]  amLODipine (NORVASC) 10 MG tablet Take 1 tablet (10 mg total) by mouth daily. 12/14/19  Yes Maximiano Coss, NP  aspirin EC 81 MG EC tablet Take 1 tablet (81 mg total) by mouth daily. Swallow whole. 04/02/20  Yes Vann, Jessica U, DO  atorvastatin (LIPITOR) 40 MG tablet Take 1 tablet (40 mg total) by mouth daily. 04/02/20  Yes Geradine Girt, DO  clopidogrel (PLAVIX) 75 MG tablet Take 1 tablet (75 mg total) by mouth daily. 04/02/20  Yes Vann, Jessica U, DO  lisinopril (ZESTRIL) 10 MG tablet Take 1 tablet (10 mg total) by mouth daily. 12/14/19  Yes Maximiano Coss, NP  Menthol, Topical Analgesic, (BIOFREEZE EX) Apply 1 application topically daily as needed (leg pain).   Yes [provider]  metFORMIN (GLUCOPHAGE) 500 MG tablet Take 2  tablets (1,000 mg total) by mouth 2 (two) times daily with a meal. 12/14/19 06/11/20 Yes Maximiano Coss, NP  Multiple Vitamins-Minerals (MULTIVITAMIN WITH MINERALS) tablet Take 1 tablet by mouth daily.   Yes [provider]    Exam: Current vital signs: BP (!) 146/82 (BP Location: Left Arm)   Pulse (!) 105   Temp 98 F (36.7 C) (Oral)   Resp 18   Ht 5\' 6"  (1.676 m)   Wt 63.5 kg   SpO2 100%   BMI 22.60 kg/m   Physical Exam  Constitutional: Appears well-developed and well-nourished.  Psych: smiles appropriately Unable to assess due to encephalopathy Eyes: No scleral injection HENT: No OP obstrucion Head: Normocephalic.  Cardiovascular: Normal rate and regular rhythm.  Respiratory: Effort normal and breath sounds normal to anterior ascultation GI: Soft.  No distension. There is no tenderness.  Skin: WDI  Neuro: Mental Status: Patient is awake, alert only Patient does not speak english No signs of aphasia or neglect. Cranial Nerves: II: Visual Fields are full. Pupils are equal, round, and reactive to light. III,IV, VI: EOMI without ptosis or diploplia.  V: Facial sensation is symmetric to temperature VII: Facial movement is symmetric.  VIII: hearing is intact to voice X: Uvula elevates symmetrically XI: Shoulder shrug is symmetric. XII: tongue is midline without atrophy or fasciculations.  Motor: Tone is normal. Bulk  is normal. Patient prone position able to rise on both hands and turn head to look around and nod. Otherwise does not follow commands Sensory: Sensation is difficult assess but seems grossly symmetric. Plantars: Mute Cerebellar: Unable to assess due to encephalopathy  I have reviewed the images obtained: MRI brain 06/14/2020 though motion degraded incomplete showed Subacute right parietal infarct. No definite acute infarct identified.  EEG 06/08/2020: No seizures or epileptiform discharges were seen throughout the recording.  Assessment: Sonya George is a 64 y.o. female PMHx HTN, DM, recent stroke with complete workup 03/2020 returns with imaging showing new subacute stroke. The patient is speaking with foreign language likely her native tongue. The timing of stroke and history are not entirely clear but there is suggestion that the patient previously spoke [place] and suddenly stopped which is why she was admitted. 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  Chronic infarcts in the occipital lobe bilaterally left greater than right.  Chronic infarct left cerebellum. HTN DM   Plan: - Repeat MRI brain without contrast will likely need sedation. - Continue antiplatelet therapy. - Blood pressure control <130/90 and risk factor control. - Further recommendations from stroke service.  Electronically signed by: Dr. Lynnae Sandhoff Pager: [phone] 06/15/2020, 2:59 PM

## 2020-06-15 NOTE — Progress Notes (Addendum)
PROGRESS NOTE        PATIENT DETAILS Name: Sonya George Age: 64 y.o. Sex: female Date of Birth: 1956/02/07 Admit Date: 06/07/2020 Admitting Physician Reubin Milan, MD [id number], Delfino Lovett, NP  Brief Narrative: Patient is a 64 y.o. female with past medical history of HTN, DM, prior CVA-who presented with altered mental status after getting news of her sister's passing.  She was admitted for further work-up-extensive neuroimaging/EEG negative-evaluated by both neurology and psychiatry-thought to have functional/stress-induced etiology for altered mental status.  Significant events: 11/6>> admit to [facility] for confusion  Significant studies: 11/6>> CT head: No acute intracranial abnormality. 11/6>> chest x-ray: No active pulmonary disease. 11/7>> RPR: Nonreactive 11/7>> vitamin B12: 561 11/7>> EEG: Mild diffuse encephalopathy-nonspecific etiology-no seizures. 11/13>>MRI Brain:Subacute right parietal infarct.  Antimicrobial therapy: None  Microbiology data: None  Procedures : None  Consults: Neurology, psychiatry  DVT Prophylaxis : enoxaparin (LOVENOX) injection 40 mg Start: 06/07/20 2100  Subjective: Essentially unchanged-mumbles-and mittens this morning.  Assessment/Plan: Acute confusional state: Initially thought to be functional-stress response to patient's sister passing-however she continues to have significant speech abnormalities that is much different than her usual baseline.  MRI brain on 11/13 shows a recent right parietal infarct.  EEG was negative for seizures.  Have reconsulted neurology-continue Seroquel.  Patient recently had extensive stroke work-up-have not ordered any more work-up until neurology sees patient.  Per patient's daughter-although had aphasia/dysarthria after recent stroke-she improved and was able to communicate with the daughter in her usual language.  As noted in my prior note-patient's mental status continues to  wax and wane.  But per patient's daughter-her current baseline is significantly different from her baseline after she had her CVA.   Subacute CVA-recent hospitalization for CVA:  Await further neurology input before changing antiplatelet agents or initiating further work-up.  HLD: Continue statin  HTN: BP stable-continue amlodipine-lisinopril remains on hold.   DM-2 (A1c 6.9 on 11/8): CBG stable-continue SSI  Recent Labs    06/14/20 1628 06/14/20 2143 06/15/20 0635  GLUCAP 146* 148* 163*    Diet: Diet Order            Diet heart healthy/carb modified Room service appropriate? Yes; Fluid consistency: Thin  Diet effective now                  Code Status: Full code   Family Communication: Daughter [name] [phone]) -left a voicemail.  Addendum-daughter called back-I have spoken to her this afternoon.  Disposition Plan: Status is: Inpatient  Remains inpatient appropriate because:Inpatient level of care appropriate due to severity of illness   Dispo: The patient is from: Home              Anticipated d/c is to: TBD              Anticipated d/c date is: 2 days              Patient currently is not medically stable to d/c.   Barriers to Discharge: Continued altered mental status-not yet at baseline.  Antimicrobial agents: Anti-infectives (From admission, onward)   None       Time spent: 25- minutes-Greater than 50% of this time was spent in counseling, explanation of diagnosis, planning of further management, and coordination of care.  MEDICATIONS: Scheduled Meds: . amLODipine  10 mg Oral Daily  . aspirin EC  81  mg Oral Daily  . atorvastatin  40 mg Oral q1800  . enoxaparin (LOVENOX) injection  40 mg Subcutaneous Q24H  . insulin aspart  0-9 Units Subcutaneous TID WC  . multivitamin with minerals  1 tablet Oral Daily  . QUEtiapine  50 mg Oral QHS   Continuous Infusions: PRN Meds:.acetaminophen **OR** acetaminophen, diphenhydrAMINE, haloperidol  lactate, labetalol, ondansetron **OR** ondansetron (ZOFRAN) IV   PHYSICAL EXAM: Vital signs: Vitals:   06/14/20 2036 06/14/20 2358 06/15/20 0335 06/15/20 1017  BP: 122/63 (!) 100/55 103/72 122/73  Pulse: 88 89 84 93  Resp: 16 18 17 20   Temp: 98.7 F (37.1 C) 99.4 F (37.4 C) 97.9 F (36.6 C) 98.5 F (36.9 C)  TempSrc: Oral Oral Oral Oral  SpO2: 98% 99% 98% 97%  Weight:      Height:       Filed Weights   06/07/20 0840  Weight: 63.5 kg   Body mass index is 22.6 kg/m.   Gen Exam: Awake-confused-mumbling incoherently. HEENT:atraumatic, normocephalic Chest: B/L clear to auscultation anteriorly CVS:S1S2 regular Abdomen:soft non tender, non distended Extremities:no edema Neurology: Difficult exam but seems to be moving all 4 extremities.  Given Skin: no rash  I have personally reviewed following labs and imaging studies  LABORATORY DATA: CBC: Recent Labs  Lab 06/09/20 1346 06/14/20 0420  WBC 9.5 8.4  HGB 14.1 13.7  HCT 41.4 41.3  MCV 87.9 89.4  PLT 319 774    Basic Metabolic Panel: Recent Labs  Lab 06/08/20 1302 06/09/20 1346 06/14/20 0420  NA  --  137 140  K  --  3.9 3.5  CL  --  102 104  CO2  --  24 25  GLUCOSE  --  244* 154*  BUN  --  5* 14  CREATININE  --  0.67 0.79  CALCIUM  --  9.8 9.7  MG 1.8  --   --   PHOS 2.5  --   --     GFR: Estimated Creatinine Clearance: 65.6 mL/min (by C-G formula based on SCr of 0.79 mg/dL).  Liver Function Tests: Recent Labs  Lab 06/09/20 1346 06/14/20 0420  AST 48* 27  ALT 26 20  ALKPHOS 73 59  BILITOT 1.0 0.9  PROT 7.3 6.5  ALBUMIN 3.6 3.2*   No results for input(s): LIPASE, AMYLASE in the last 168 hours. Recent Labs  Lab 06/08/20 1302 06/14/20 0420  AMMONIA 17 15    Coagulation Profile: No results for input(s): INR, PROTIME in the last 168 hours.  Cardiac Enzymes: No results for input(s): CKTOTAL, CKMB, CKMBINDEX, TROPONINI in the last 168 hours.  BNP (last 3 results) No results for  input(s): PROBNP in the last 8760 hours.  Lipid Profile: No results for input(s): CHOL, HDL, LDLCALC, TRIG, CHOLHDL, LDLDIRECT in the last 72 hours.  Thyroid Function Tests: No results for input(s): TSH, T4TOTAL, FREET4, T3FREE, THYROIDAB in the last 72 hours.  Anemia Panel: No results for input(s): VITAMINB12, FOLATE, FERRITIN, TIBC, IRON, RETICCTPCT in the last 72 hours.  Urine analysis:    Component Value Date/Time   COLORURINE STRAW (A) 06/08/2020 1349   APPEARANCEUR CLEAR 06/08/2020 1349   LABSPEC 1.015 06/08/2020 1349   PHURINE 8.0 06/08/2020 1349   GLUCOSEU 100 (A) 06/08/2020 1349   HGBUR NEGATIVE 06/08/2020 1349   BILIRUBINUR NEGATIVE 06/08/2020 1349   KETONESUR 15 (A) 06/08/2020 1349   PROTEINUR NEGATIVE 06/08/2020 1349   NITRITE NEGATIVE 06/08/2020 1349   LEUKOCYTESUR NEGATIVE 06/08/2020 1349    Sepsis Labs:  Lactic Acid, Venous No results found for: LATICACIDVEN  MICROBIOLOGY: Recent Results (from the past 240 hour(s))  Respiratory Panel by RT PCR (Flu A&B, Covid) - Nasopharyngeal Swab     Status: None   Collection Time: 06/07/20 10:39 AM   Specimen: Nasopharyngeal Swab  Result Value Ref Range Status   SARS Coronavirus 2 by RT PCR NEGATIVE NEGATIVE Final    Comment: (NOTE) SARS-CoV-2 target nucleic acids are NOT DETECTED.  The SARS-CoV-2 RNA is generally detectable in upper respiratoy specimens during the acute phase of infection. The lowest concentration of SARS-CoV-2 viral copies this assay can detect is 131 copies/mL. A negative result does not preclude SARS-Cov-2 infection and should not be used as the sole basis for treatment or other patient management decisions. A negative result may occur with  improper specimen collection/handling, submission of specimen other than nasopharyngeal swab, presence of viral mutation(s) within the areas targeted by this assay, and inadequate number of viral copies (<131 copies/mL). A negative result must be combined  with clinical observations, patient history, and epidemiological information. The expected result is Negative.  Fact Sheet for Patients:  PinkCheek.be  Fact Sheet for Healthcare Providers:  GravelBags.it  This test is no t yet approved or cleared by the [place] FDA and  has been authorized for detection and/or diagnosis of SARS-CoV-2 by FDA under an Emergency Use Authorization (EUA). This EUA will remain  in effect (meaning this test can be used) for the duration of the COVID-19 declaration under Section 564(b)(1) of the Act, 21 U.S.C. section 360bbb-3(b)(1), unless the authorization is terminated or revoked sooner.     Influenza A by PCR NEGATIVE NEGATIVE Final   Influenza B by PCR NEGATIVE NEGATIVE Final    Comment: (NOTE) The Xpert Xpress SARS-CoV-2/FLU/RSV assay is intended as an aid in  the diagnosis of influenza from Nasopharyngeal swab specimens and  should not be used as a sole basis for treatment. Nasal washings and  aspirates are unacceptable for Xpert Xpress SARS-CoV-2/FLU/RSV  testing.  Fact Sheet for Patients: PinkCheek.be  Fact Sheet for Healthcare Providers: GravelBags.it  This test is not yet approved or cleared by the [place] FDA and  has been authorized for detection and/or diagnosis of SARS-CoV-2 by  FDA under an Emergency Use Authorization (EUA). This EUA will remain  in effect (meaning this test can be used) for the duration of the  Covid-19 declaration under Section 564(b)(1) of the Act, 21  U.S.C. section 360bbb-3(b)(1), unless the authorization is  terminated or revoked. Performed at [facility], [place] [address], [place], [place] [address]     RADIOLOGY STUDIES/RESULTS: MR BRAIN WO CONTRAST  Result Date: 06/14/2020 CLINICAL DATA:  Mental status change, unknown cause. Confusion, delirium, and agitation. EXAM: MRI  HEAD WITHOUT CONTRAST TECHNIQUE: Multiplanar, multiecho pulse sequences of the brain and surrounding structures were obtained without intravenous contrast. COMPARISON:  Head CT 06/07/2020 and MRI 03/29/2020 FINDINGS: The patient was unable to tolerate the examination despite being medicated. Axial and coronal diffusion, axial FLAIR, and T2* gradient echo sequences were obtained and are motion degraded including severe motion on the gradient echo sequence. No sizable acute infarct is identified on motion degraded diffusion imaging. There is a small amount of residual hyperintense trace diffusion weighted signal abnormality in the area of left parietal infarction which was acute on the prior MRI and now demonstrates encephalomalacia. There is a subacute right parietal infarct which is new from the prior MRI. Multiple chronic infarcts are again noted including  left MCA, bilateral parieto-occipital, and left cerebellar infarcts. No midline shift or extra-axial fluid collection is evident. 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 IMPRESSION: 1. Motion degraded, incomplete examination. 2. Subacute right parietal infarct. 3. No definite acute infarct identified. 4. Multiple chronic infarcts as above. Electronically Signed   By: Logan Bores M.D.   On: 06/14/2020 19:56     LOS: 7 days   Oren Binet, MD  [facility]    To contact the attending provider between 7A-7P or the covering provider during after hours 7P-7A, please log into the web site [email or web address] and access using universal Warrior password for that web site. If you do not have the password, please call the hospital operator.  06/15/2020, 10:34 AM

## 2020-06-16 ENCOUNTER — Inpatient Hospital Stay (HOSPITAL_COMMUNITY): Payer: Medicaid Other

## 2020-06-16 DIAGNOSIS — I63413 Cerebral infarction due to embolism of bilateral middle cerebral arteries: Secondary | ICD-10-CM

## 2020-06-16 DIAGNOSIS — R569 Unspecified convulsions: Secondary | ICD-10-CM

## 2020-06-16 LAB — GLUCOSE, CAPILLARY
Glucose-Capillary: 196 mg/dL — ABNORMAL HIGH (ref 70–99)
Glucose-Capillary: 190 mg/dL — ABNORMAL HIGH (ref 70–99)
Glucose-Capillary: 149 mg/dL — ABNORMAL HIGH (ref 70–99)
Glucose-Capillary: 152 mg/dL — ABNORMAL HIGH (ref 70–99)
Glucose-Capillary: 164 mg/dL — ABNORMAL HIGH (ref 70–99)
Glucose-Capillary: 137 mg/dL — ABNORMAL HIGH (ref 70–99)

## 2020-06-16 LAB — T4, FREE: Free T4: 1.05 ng/dL (ref 0.61–1.12)

## 2020-06-16 MED ORDER — IOHEXOL 300 MG/ML  SOLN
100.0000 mL | Freq: Once | INTRAMUSCULAR | Status: AC | PRN
  Administered 2020-06-16: 100 mL via INTRAVENOUS

## 2020-06-16 NOTE — Progress Notes (Signed)
Occupational Therapy Treatment Patient Details Name: Sonya George MRN: [id number] DOB: 1955/09/12 Today's Date: 06/16/2020    History of present illness 64 y.o. female admitted with AMS with difficulty communicating. 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 pt noncompliant spits out medications PMH CVA HTN DM seizures   OT comments  Pt seen in conjunction with  PT to maximize pts activity tolerance, for pt/ therapist safety and to address cognitive deficits. Pt continues to be most limited by cognition as pt unable to follow commands. Pt reaching sporadically for items in bed and remains impulsive with movement. Deferred OOB transfer for safety as pt also on continuous EEG. Pt required MAX A +2 +2 for bed mobility and up to MOD A for sitting balance EOB. Pt completed x3 sit<>stands from EOB with total A +2. Attempted functional reach task from EOB with ADL items, pt was able to retrieve all items from R and L visual field however pt with no carryover of how to use items purposefully such as a tooth brush. Feel pt remains appropriate candidate for SNF level therapies, will follow acutely per POC.   Follow Up Recommendations  SNF;Other (comment) (family to d/c home with pt so HHOT)    Equipment Recommendations  3 in 1 bedside commode;Wheelchair (measurements OT);Wheelchair cushion (measurements OT)    Recommendations for Other Services      Precautions / Restrictions Precautions Precautions: Fall;Other (comment) Precaution Comments: tele safety sitter, mittens donned at end of session per RN request due to pt attempting to pull EEG wires off head Restrictions Weight Bearing Restrictions: No       Mobility Bed Mobility Overal bed mobility: Needs Assistance Bed Mobility: Rolling;Supine to Sit;Sit to Supine Rolling: Max assist;+2 for physical assistance;+2 for safety/equipment   Supine to sit: Max assist;+2 for physical assistance;+2 for safety/equipment Sit to supine:  Total assist;+2 for physical assistance;+2 for safety/equipment   General bed mobility comments: pt with continuous EEG going. 2 person max/total assist needed for all bed mobility and safety to prevent pt from pulling on lines/leads. pt with no attempts to initiate or assist with movements, however did not resist movements as well.  Transfers Overall transfer level: Needs assistance Equipment used: 2 person hand held assist Transfers: Sit to/from Stand Sit to Stand: Max assist;+2 physical assistance         General transfer comment: sit<>stand x3 reps at edge of bed with 2 person max assist. pads at hips used with 1st stand, not needed with next 2 stands. cues/faciliation needed for trunk/hip/knee extension to stand tall. worked on lateral shifting toward [facility] with each return to EOB. Pt accepting weight through LE's, however no initiation or attempts to assist with standing noted.    Balance Overall balance assessment: Needs assistance Sitting-balance support: Feet supported;No upper extremity supported Sitting balance-Leahy Scale: Poor Sitting balance - Comments: pt progressed at edge of bed from needing up to mod assist for balance to bouts of min guard assist while attempting to engage pt in activities. see OT notes. Postural control: Posterior lean   Standing balance-Leahy Scale: Zero                             ADL either performed or assessed with clinical judgement   ADL Overall ADL's : Needs assistance/impaired       Grooming Details (indicate cue type and reason): worked on functional reach to ADL items. pt was able to grasp items  with RUE however pts reach was not purposefully                 Toilet Transfer: Total assistance;+2 for physical assistance Toilet Transfer Details (indicate cue type and reason): unable to complete simulated transfer however pt was able to complete x3 sit<>stands with total A +2 as preursor to higher level transfers          Functional mobility during ADLs: Maximal assistance;+2 for physical assistance;Total assistance (total A +2 sit<>stand, MAX A +2 bed mobility) General ADL Comments: pt continues to be limited mostly by cognitive deficits as pt unable to follow commands and continues to speak mostly unintelligibly. pt nonpurposefully reaching out for items during session     Vision   Vision Assessment?: Vision impaired- to be further tested in functional context Additional Comments: vision continues to be difficult to assess secondary to cognitive deficits however pt continues to prefer R gaze but will track stimulus to L with tactile facilitation, visual deficits likely residual from previous CVA   Perception     Praxis      Cognition Arousal/Alertness: Awake/alert Behavior During Therapy: Restless Overall Cognitive Status: Difficult to assess Area of Impairment: Attention;Following commands;Safety/judgement                   Current Attention Level: Selective   Following Commands: Follows one step commands inconsistently Safety/Judgement: Decreased awareness of safety;Decreased awareness of deficits     General Comments: pt continues to be unable to follow commands, pt did verbalize "no" but pts speech mostly unintelligible. Pt restless during session with pt spontaneously reaching out for items during session. tried to redirect pt to reach purposefully for ADL items, pt was ablw to retrieve items from R and L visual field however pt with no carryover of what to do with items such as a toothbrush        Exercises   Shoulder Instructions       General Comments      Pertinent Vitals/ Pain       Pain Assessment: Faces Faces Pain Scale: No hurt Pain Location: no signs or compliants of pain with session  Home Living                                          Prior Functioning/Environment              Frequency  Min 2X/week        Progress Toward  Goals  OT Goals(current goals can now be found in the care plan section)  Progress towards OT goals: Progressing toward goals  Acute Rehab OT Goals Patient Stated Goal: per discussion with OTA pt's daughter plans to fly herself and the pt to [place] for sister's funeral- as of 06/16/20 this in no longer the plan OT Goal Formulation: With family Time For Goal Achievement: 06/25/20 Potential to Achieve Goals: Good  Plan Discharge plan remains appropriate;Frequency remains appropriate    Co-evaluation      Reason for Co-Treatment: Complexity of the patient's impairments (multi-system involvement);For patient/therapist safety;To address functional/ADL transfers;Necessary to address cognition/behavior during functional activity PT goals addressed during session: Mobility/safety with mobility;Balance;Strengthening/ROM OT goals addressed during session: ADL's and self-care      AM-PAC OT "6 Clicks" Daily Activity     Outcome Measure   Help from another person eating meals?: A Lot Help from another  person taking care of personal grooming?: A Lot Help from another person toileting, which includes using toliet, bedpan, or urinal?: Total Help from another person bathing (including washing, rinsing, drying)?: A Lot Help from another person to put on and taking off regular upper body clothing?: A Lot Help from another person to put on and taking off regular lower body clothing?: Total 6 Click Score: 10    End of Session Equipment Utilized During Treatment: Gait belt  OT Visit Diagnosis: Unsteadiness on feet (R26.81);Muscle weakness (generalized) (M62.81)   Activity Tolerance Other (comment) (inability to follow commands)   Patient Left in bed;with call bell/phone within reach;with bed alarm set;with restraints reapplied;Other (comment) (mitts)   Nurse Communication Mobility status        Time: [phone] OT Time Calculation (min): 9 min  Charges: OT General Charges $OT Visit: 1  Visit OT Treatments $Therapeutic Activity: 8-22 mins Lanier Clam., COTA/L Acute Rehabilitation Services [phone]    Ihor Gully 06/16/2020, 2:51 PM

## 2020-06-16 NOTE — Progress Notes (Signed)
LTM started; no initial skin breakdown was seen. Event button tested.

## 2020-06-16 NOTE — Progress Notes (Signed)
Physical Therapy Treatment Patient Details Name: Sonya George MRN: [id number] DOB: Dec 15, 1955 Today's Date: 06/16/2020    History of Present Illness 64 y.o. female admitted with AMS with difficulty communicating. 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 pt noncompliant spits out medications PMH CVA HTN DM seizures    PT Comments    Today's skilled session continued to focus on mobility and cognition. Pt continues to need increased assistance for all mobility. Acute PT to continue during pt's hospital stay.   Follow Up Recommendations  SNF;Supervision/Assistance - 24 hour     Equipment Recommendations  Wheelchair (measurements PT);Wheelchair cushion (measurements PT);Hospital bed;3in1 (PT);Rolling walker with 5" wheels    Precautions / Restrictions Precautions Precautions: Fall;Other (comment) Precaution Comments: tele safety sitter, mittens donned at end of session per RN request due to pt attempting to pull EEG wires off head Restrictions Weight Bearing Restrictions: No    Mobility  Bed Mobility Overal bed mobility: Needs Assistance Bed Mobility: Rolling;Supine to Sit;Sit to Supine Rolling: Max assist;+2 for physical assistance;+2 for safety/equipment   Supine to sit: Max assist;+2 for physical assistance;+2 for safety/equipment Sit to supine: Total assist;+2 for physical assistance;+2 for safety/equipment   General bed mobility comments: pt with continuous EEG going. 2 person max/total assist needed for all bed mobility and safety to prevent pt from pulling on lines/leads. pt with no attempts to initiate or assist with movements, however did not resist movements as well.  Transfers Overall transfer level: Needs assistance Equipment used: 2 person hand George assist Transfers: Sit to/from Stand Sit to Stand: Max assist         General transfer comment: sit<>stand x3 reps at edge of bed with 2 person max assist. pads at hips used with 1st stand, not needed  with next 2 stands. cues/faciliation needed for trunk/hip/knee extension to stand tall. worked on lateral shifting toward [facility] with each return to EOB. Pt accepting weight through LE's, however no initiation or attempts to assist with standing noted.      Balance Overall balance assessment: Needs assistance Sitting-balance support: Feet supported;No upper extremity supported Sitting balance-Leahy Scale: Poor Sitting balance - Comments: pt progressed at edge of bed from needing up to mod assist for balance to bouts of min guard assist while attempting to engage pt in activities. see OT notes. Postural control: Posterior lean                Cognition Arousal/Alertness: Awake/alert Behavior During Therapy: Restless Overall Cognitive Status: Difficult to assess Area of Impairment: Attention;Following commands;Safety/judgement       Current Attention Level: Selective   Following Commands: Follows one step commands inconsistently Safety/Judgement: Decreased awareness of safety;Decreased awareness of deficits     General Comments: pt not following any commands. does not track to name or voice, looks around room randomly. Did nod head to PTA at start of session "yes" to working with PT. Also while edge of bed pt did clearly state "no" when COTA attempting to remove tooth brush from her hand. Otherwise all other voclaizations where in her native language.      Exercises General Exercises - Lower Extremity Heel Slides: PROM;Both;Supine;Limitations (3-4 reps each side) Heel Slides Limitations: pt very resistance to knee flexion with bil LE's Hip ABduction/ADduction: PROM;Strengthening;10 reps;Supine;Limitations;Both Hip Abduction/Adduction Limitations: no resistance or assistance with ex's Straight Leg Raises: PROM;Strengthening;Both;10 reps;Supine;Limitations Straight Leg Raises Limitations: no resistance or assistance with ex's.     Pertinent Vitals/Pain Pain Assessment:  Faces Faces Pain Scale: No hurt  Pain Location: no signs or compliants of pain with session     PT Goals (current goals can now be found in the care plan section) Acute Rehab PT Goals Patient Stated Goal: per discussion with OTA pt's daughter plans to fly herself and the pt to [place] for sister's funeral- as of 06/16/20 this in no longer the plan PT Goal Formulation: With patient/family Time For Goal Achievement: 06/23/20 Potential to Achieve Goals: Fair Progress towards PT goals: Not progressing toward goals - comment (continues to be limited by language barrier and cognition)    Frequency    Min 3X/week      PT Plan Current plan remains appropriate    Co-evaluation PT/OT/SLP Co-Evaluation/Treatment: Yes Reason for Co-Treatment: Complexity of the patient's impairments (multi-system involvement);For patient/therapist safety (refer to OT notes as well.) PT goals addressed during session: Mobility/safety with mobility;Balance;Strengthening/ROM        AM-PAC PT "6 Clicks" Mobility   Outcome Measure  Help needed turning from your back to your side while in a flat bed without using bedrails?: Total Help needed moving from lying on your back to sitting on the side of a flat bed without using bedrails?: A Lot Help needed moving to and from a bed to a chair (including a wheelchair)?: A Lot Help needed standing up from a chair using your arms (e.g., wheelchair or bedside chair)?: A Lot Help needed to walk in hospital room?: Total Help needed climbing 3-5 steps with a railing? : Total 6 Click Score: 9    End of Session Equipment Utilized During Treatment: Gait belt Activity Tolerance: Patient tolerated treatment well Patient left: in bed;with call bell/phone within reach;with bed alarm set;with nursing/sitter in room;with restraints reapplied (bil mittens at RN request) Nurse Communication: Mobility status PT Visit Diagnosis: Muscle weakness (generalized) (M62.81);Difficulty in  walking, not elsewhere classified (R26.2);Other abnormalities of gait and mobility (R26.89);Unsteadiness on feet (R26.81)     Time: 1225 (see OT note for events prior to this time frame as charges were split between the 2 diciplines)-1240 PT Time Calculation (min) (ACUTE ONLY): 15 min  Charges:  $Therapeutic Activity: 8-22 mins                     Willow Ora, PTA, [facility] [phone] 06/16/20, 2:32 PM   Willow Ora 06/16/2020, 2:32 PM

## 2020-06-16 NOTE — Plan of Care (Signed)
Patient Sonya George unable to progress towards goals of care at this time due to altered mental status/diagnosis and disorientation x4.   Problem: Education: Goal: Knowledge of General Education information will improve Description: Including pain rating scale, medication(s)/side effects and non-pharmacologic comfort measures Outcome: Not Progressing   Problem: Health Behavior/Discharge Planning: Goal: Ability to manage health-related needs will improve Outcome: Not Progressing   Problem: Clinical Measurements: Goal: Ability to maintain clinical measurements within normal limits will improve Outcome: Not Progressing Goal: Will remain free from infection Outcome: Not Progressing Goal: Diagnostic test results will improve Outcome: Not Progressing Goal: Respiratory complications will improve Outcome: Not Progressing Goal: Cardiovascular complication will be avoided Outcome: Not Progressing   Problem: Activity: Goal: Risk for activity intolerance will decrease Outcome: Not Progressing   Problem: Nutrition: Goal: Adequate nutrition will be maintained Outcome: Not Progressing   Problem: Coping: Goal: Level of anxiety will decrease Outcome: Not Progressing   Problem: Elimination: Goal: Will not experience complications related to bowel motility Outcome: Not Progressing Goal: Will not experience complications related to urinary retention Outcome: Not Progressing   Problem: Pain Managment: Goal: General experience of comfort will improve Outcome: Not Progressing   Problem: Safety: Goal: Ability to remain free from injury will improve Outcome: Not Progressing   Problem: Skin Integrity: Goal: Risk for impaired skin integrity will decrease Outcome: Not Progressing

## 2020-06-16 NOTE — Progress Notes (Signed)
STROKE TEAM PROGRESS NOTE   INTERVAL HISTORY Her RNs is at the bedside.  I also talked with daughter over the phone. Per daughter, pt had stroke in 03/2020 and recovered well, no aphasia, no motor deficit. However, 3 weeks ago, pt was not sleeping all day long. Since 2 weeks ago, pt had worsening symptoms, odds behavior, confused, running water all over the house, climbing the stairs up and down constantly, etc. Also language became gradually impaired, developing to not able to communicate. Symptoms wax and wane.   MRI showed new subacute right parietal infarcts with chronic left MCA infarct as well as multiple old  infarcts. However, encephalitis and seizure can not be ruled out.   OBJECTIVE Vitals:   06/15/20 1937 06/16/20 0007 06/16/20 0351 06/16/20 0943  BP: 134/70 113/86 (!) 167/79 135/72  Pulse: (!) 108 96 87 (!) 112  Resp: 18 18  18   Temp: 98.5 F (36.9 C) (!) 97.5 F (36.4 C) 98.5 F (36.9 C) 98.9 F (37.2 C)  TempSrc: Oral Oral Axillary Axillary  SpO2: 100% 99% 98% 100%  Weight:      Height:        CBC:  Recent Labs  Lab 06/09/20 1346 06/14/20 0420  WBC 9.5 8.4  HGB 14.1 13.7  HCT 41.4 41.3  MCV 87.9 89.4  PLT 319 631    Basic Metabolic Panel:  Recent Labs  Lab 06/09/20 1346 06/14/20 0420  NA 137 140  K 3.9 3.5  CL 102 104  CO2 24 25  GLUCOSE 244* 154*  BUN 5* 14  CREATININE 0.67 0.79  CALCIUM 9.8 9.7    Lipid Panel:     Component Value Date/Time   CHOL 149 03/30/2020 0531   TRIG 82 03/30/2020 0531   HDL 38 (L) 03/30/2020 0531   CHOLHDL 3.9 03/30/2020 0531   VLDL 16 03/30/2020 0531   LDLCALC 95 03/30/2020 0531   HgbA1c:  Lab Results  Component Value Date   HGBA1C 6.9 (H) 06/09/2020   Urine Drug Screen:     Component Value Date/Time   LABOPIA NONE DETECTED 06/08/2020 1349   COCAINSCRNUR NONE DETECTED 06/08/2020 1349   LABBENZ POSITIVE (A) 06/08/2020 1349   AMPHETMU NONE DETECTED 06/08/2020 1349   THCU NONE DETECTED 06/08/2020 1349    LABBARB NONE DETECTED 06/08/2020 1349    Alcohol Level No results found for: [place]  MR BRAIN WO CONTRAST 06/14/2020 IMPRESSION:  1. Motion degraded, incomplete examination.  2. Subacute right parietal infarct.  3. No definite acute infarct identified.  4. Multiple chronic infarcts as above.   TEE 04/01/2020 IMPRESSIONS  1. Left ventricular ejection fraction, by estimation, is 60 to 65%. The  left ventricle has normal function. The left ventricle has no regional  wall motion abnormalities.  2. Right ventricular systolic function is normal. The right ventricular  size is normal.  3. Left atrial size was moderately dilated. No left atrial/left atrial  appendage thrombus was detected.  4. The mitral valve is normal in structure. Mild mitral valve  regurgitation. No evidence of mitral stenosis.  5. Tricuspid valve regurgitation is mild to moderate.  6. The aortic valve is tricuspid. Aortic valve regurgitation is moderate.  Mild to moderate aortic valve sclerosis/calcification is present, without  any evidence of aortic stenosis.  7. The inferior vena cava is normal in size with greater than 50%  respiratory variability, suggesting right atrial pressure of 3 mmHg.  8. Agitated saline contrast bubble study was negative, with no evidence  of any interatrial shunt.   ECG - SR rate 84 BPM. (See cardiology reading for complete details)  PHYSICAL EXAM  Temp:  [97.5 F (36.4 C)-98.9 F (37.2 C)] 98.9 F (37.2 C) (11/15 0943) Pulse Rate:  [87-112] 112 (11/15 0943) Resp:  [18] 18 (11/15 0943) BP: (113-167)/(70-86) 135/72 (11/15 0943) SpO2:  [98 %-100 %] 100 % (11/15 0943)  General - Well nourished, well developed, restless, altered.  Ophthalmologic - fundi not visualized due to noncooperation.  Cardiovascular - Regular rhythm and rate.  Neuro - restless, intermittently combative, global aphasia, very limited words output, able to say in her native language  "OK,OK", not following commands. Eyes midposition, not tracking but seems to have left gaze preference, not blinking to visual threat bilaterally. No facial asymmetry, moving all extremities strong equally. Sensation, coordination and gait not tested.   ASSESSMENT/PLAN [name] is a 64 y.o. female with history of HTN, HLD, DM, recent stroke with complete workup 03/2020 (TEE 04/01/20) returns with imaging showing new subacute stroke. She did not receive IV t-PA due to recent stroke.  Rapid progressive dementia  AMS with behavior change, gradually worsening for the last 3 weeks  MRI new subacute right parietal infarct since 03/2020. 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 Multiple other chronic infarct including left PCA, b/l MCA/PCA and left cerebellar. However, encephalitis or seizure related changes still in DDx.  LTM EEG pending  EEG 11/7 diffuse encephalopathy   B1, B12, ammonia, TSH, FT4, HIV, RPR all neg  Thyroid antibodies pending  Pan CT pending to rule out paraneoplastic syndrome  Will need LP to rule out encephalitis  Stroke: Subacute right parietal infarct - embolic - source unknown.  MRI head - Subacute right parietal infarct. No definite acute infarct identified. Multiple chronic infarcts as above.   Will do CTA head and neck   TEE - 04/01/20 - EF 60 - 65%. No cardiac source of emboli identified.   [employer] Virus 2 - negative  HgbA1c - 6.9  UDS - benzodiazepine  VTE prophylaxis - Lovenox - on hold for LP preparation  aspirin 81 mg daily and clopidogrel 75 mg daily prior to admission, now on aspirin 81 mg daily  Patient counseled to be compliant with her antithrombotic medications  Ongoing aggressive stroke risk factor management  Therapy recommendations:  pending  Disposition:  Pending  Hx of stroke  03/2020 admitted for aphasia and confusion.  CT and MRI showed left frontal parietal infarcts, chronic left frontal, bilateral  occipital, left cerebellum infarcts.  MRA unremarkable.  Carotid Doppler negative.  TTE unremarkable.  LDL 94, A1c 6.8.  TEE no source of emboli.  Loop recorder not placed due to no insurance.  Discharged on DAPT and Lipitor 40.  Hx of stroke 5-7 years ago, no details  ? Seizure   06/07/2020, episode of left arm twitching, altered mental status, decreased response and roving eyes.    CT 11/6 no acute abnormality.    EEG 11/7 negative for seizure  Currently LTM EEG pending  Hypertension  Home BP meds: amlodipine ; lisinopril  Current BP meds: amlodipine ; labetalol prn  Stable . Long-term BP goal normotensive  Hyperlipidemia  Home Lipid lowering medication: Lipitor 40 mg daily   LDL 94  (03/2020), goal < 70  Current lipid lowering medication: Lipitor 40 mg daily   Continue statin at discharge  Diabetes  Home diabetic meds: metformin  Current diabetic meds: SSI   HgbA1c 6.9, goal < 7.0  CBG  monitoring  Other Stroke Risk Factors  Advanced age  Family hx stroke (brother and sister)   Other Active Problems  Code status - Full code  Psych consult  Hospital day # 8  Patient condition no improvement within the last 24 hours, continues to have AMS, aphasia, and I have discussed with Dr. Sloan Leiter. I spent  35 minutes in total face-to-face time with the patient, more than 50% of which was spent in counseling and coordination of care, reviewing test results, images and medication, and discussing the diagnosis, treatment plan and potential prognosis. This patient's care requiresreview of multiple databases, neurological assessment, discussion with family, other specialists and medical decision making of high complexity. I had long discussion with daughter over the phone, updated pt current condition, treatment plan and potential prognosis, and answered all the questions. She expressed understanding and appreciation. She is also consented for LP over the phone.  Rosalin Hawking,  MD PhD Stroke Neurology 06/16/2020 5:40 PM  To contact Stroke Continuity provider, please refer to [email or web address] After hours, contact General Neurology

## 2020-06-16 NOTE — Progress Notes (Signed)
PROGRESS NOTE        PATIENT DETAILS Name: Sonya George Age: 64 y.o. Sex: female Date of Birth: 23-Mar-1956 Admit Date: 06/07/2020 Admitting Physician Reubin Milan, MD BTD:VVOHYW, Delfino Lovett, NP  Brief Narrative: Patient is a 64 y.o. female with past medical history of HTN, DM, prior CVA-who presented with altered mental status after getting news of her sister's passing.  She was admitted for further work-up-extensive neuroimaging/EEG negative-evaluated by both neurology and psychiatry-thought to have functional/stress-induced etiology for altered mental status.  Significant events: 11/6>> admit to [facility] for confusion  Significant studies: 11/6>> CT head: No acute intracranial abnormality. 11/6>> chest x-ray: No active pulmonary disease. 11/7>> RPR: Nonreactive 11/7>> vitamin B12: 561 11/7>> EEG: Mild diffuse encephalopathy-nonspecific etiology-no seizures. 11/13>>MRI Brain:Subacute right parietal infarct.  Antimicrobial therapy: None  Microbiology data: None  Procedures : None  Consults: Neurology, psychiatry  DVT Prophylaxis : Lovenox held in anticipation of possible lumbar puncture over the next few days.  Subjective: More awake-appears to have problems with expressive aphasia-seen with RN who speaks language.  Assessment/Plan: Acute confusional state: Initially thought to be functional-stress response to patient's sister passing-however she continues to have significant speech abnormalities that is much different than her usual baseline.  Her mental status continues to fluctuate-suspect she at times has delirium-at times has expressive aphasia.  MRI brain on 11/13 shows a recent right parietal infarct.  EEG was negative for seizures.  Neurology reconsulted on 11/14-discussed with Dr. Erlinda Hong  today-plans are for possible LP, LTM EEG.  Will await formal recommendations from stroke service/neurology.  Subacute CVA-recent hospitalization for  CVA: See above-await formal evaluation by stroke service.  HLD: Continue statin  HTN: BP stable-continue amlodipine-lisinopril remains on hold.   DM-2 (A1c 6.9 on 11/8): CBG stable-continue SSI  Recent Labs    06/16/20 0619 06/16/20 0939 06/16/20 1255  GLUCAP 196* 190* 149*    Diet: Diet Order            Diet heart healthy/carb modified Room service appropriate? Yes; Fluid consistency: Thin  Diet effective now                  Code Status: Full code   Family Communication: Daughter [name] [phone]) over the phone on 11/15.   Disposition Plan: Status is: Inpatient  Remains inpatient appropriate because:Inpatient level of care appropriate due to severity of illness   Dispo: The patient is from: Home              Anticipated d/c is to: TBD              Anticipated d/c date is: 2 days              Patient currently is not medically stable to d/c.   Barriers to Discharge: Continued altered mental status-not yet at baseline-LTM EEG in progress-LP planned.  Antimicrobial agents: Anti-infectives (From admission, onward)   None       Time spent: 25- minutes-Greater than 50% of this time was spent in counseling, explanation of diagnosis, planning of further management, and coordination of care.  MEDICATIONS: Scheduled Meds: . amLODipine  10 mg Oral Daily  . aspirin EC  81 mg Oral Daily  . atorvastatin  40 mg Oral q1800  . insulin aspart  0-9 Units Subcutaneous TID WC  . multivitamin with minerals  1 tablet Oral Daily  .  QUEtiapine  50 mg Oral QHS   Continuous Infusions: PRN Meds:.acetaminophen **OR** acetaminophen, diphenhydrAMINE, haloperidol lactate, labetalol, ondansetron **OR** ondansetron (ZOFRAN) IV   PHYSICAL EXAM: Vital signs: Vitals:   06/16/20 0007 06/16/20 0351 06/16/20 0943 06/16/20 1302  BP: 113/86 (!) 167/79 135/72 140/86  Pulse: 96 87 (!) 112 94  Resp: 18  18 18   Temp: (!) 97.5 F (36.4 C) 98.5 F (36.9 C) 98.9 F (37.2 C)  98.2 F (36.8 C)  TempSrc: Oral Axillary Axillary Axillary  SpO2: 99% 98% 100% 94%  Weight:      Height:       Filed Weights   06/07/20 0840  Weight: 63.5 kg   Body mass index is 22.6 kg/m.   Gen Exam: More awake-appears to have expressive aphasia. HEENT:atraumatic, normocephalic Chest: B/L clear to auscultation anteriorly CVS:S1S2 regular Abdomen:soft non tender, non distended Extremities: Seems to be moving all 4 extremities. Neurology: Non focal Skin: no rash  I have personally reviewed following labs and imaging studies  LABORATORY DATA: CBC: Recent Labs  Lab 06/14/20 0420  WBC 8.4  HGB 13.7  HCT 41.3  MCV 89.4  PLT 536    Basic Metabolic Panel: Recent Labs  Lab 06/14/20 0420  NA 140  K 3.5  CL 104  CO2 25  GLUCOSE 154*  BUN 14  CREATININE 0.79  CALCIUM 9.7    GFR: Estimated Creatinine Clearance: 65.6 mL/min (by C-G formula based on SCr of 0.79 mg/dL).  Liver Function Tests: Recent Labs  Lab 06/14/20 0420  AST 27  ALT 20  ALKPHOS 59  BILITOT 0.9  PROT 6.5  ALBUMIN 3.2*   No results for input(s): LIPASE, AMYLASE in the last 168 hours. Recent Labs  Lab 06/14/20 0420  AMMONIA 15    Coagulation Profile: No results for input(s): INR, PROTIME in the last 168 hours.  Cardiac Enzymes: No results for input(s): CKTOTAL, CKMB, CKMBINDEX, TROPONINI in the last 168 hours.  BNP (last 3 results) No results for input(s): PROBNP in the last 8760 hours.  Lipid Profile: No results for input(s): CHOL, HDL, LDLCALC, TRIG, CHOLHDL, LDLDIRECT in the last 72 hours.  Thyroid Function Tests: No results for input(s): TSH, T4TOTAL, FREET4, T3FREE, THYROIDAB in the last 72 hours.  Anemia Panel: No results for input(s): VITAMINB12, FOLATE, FERRITIN, TIBC, IRON, RETICCTPCT in the last 72 hours.  Urine analysis:    Component Value Date/Time   COLORURINE STRAW (A) 06/08/2020 1349   APPEARANCEUR CLEAR 06/08/2020 1349   LABSPEC 1.015 06/08/2020 1349    PHURINE 8.0 06/08/2020 1349   GLUCOSEU 100 (A) 06/08/2020 1349   HGBUR NEGATIVE 06/08/2020 1349   BILIRUBINUR NEGATIVE 06/08/2020 1349   KETONESUR 15 (A) 06/08/2020 1349   PROTEINUR NEGATIVE 06/08/2020 1349   NITRITE NEGATIVE 06/08/2020 1349   LEUKOCYTESUR NEGATIVE 06/08/2020 1349    Sepsis Labs: Lactic Acid, Venous No results found for: LATICACIDVEN  MICROBIOLOGY: Recent Results (from the past 240 hour(s))  Respiratory Panel by RT PCR (Flu A&B, Covid) - Nasopharyngeal Swab     Status: None   Collection Time: 06/07/20 10:39 AM   Specimen: Nasopharyngeal Swab  Result Value Ref Range Status   SARS Coronavirus 2 by RT PCR NEGATIVE NEGATIVE Final    Comment: (NOTE) SARS-CoV-2 target nucleic acids are NOT DETECTED.  The SARS-CoV-2 RNA is generally detectable in upper respiratoy specimens during the acute phase of infection. The lowest concentration of SARS-CoV-2 viral copies this assay can detect is 131 copies/mL. A negative result does not preclude  SARS-Cov-2 infection and should not be used as the sole basis for treatment or other patient management decisions. A negative result may occur with  improper specimen collection/handling, submission of specimen other than nasopharyngeal swab, presence of viral mutation(s) within the areas targeted by this assay, and inadequate number of viral copies (<131 copies/mL). A negative result must be combined with clinical observations, patient history, and epidemiological information. The expected result is Negative.  Fact Sheet for Patients:  PinkCheek.be  Fact Sheet for Healthcare Providers:  GravelBags.it  This test is no t yet approved or cleared by the [place] FDA and  has been authorized for detection and/or diagnosis of SARS-CoV-2 by FDA under an Emergency Use Authorization (EUA). This EUA will remain  in effect (meaning this test can be used) for the duration of  the COVID-19 declaration under Section 564(b)(1) of the Act, 21 U.S.C. section 360bbb-3(b)(1), unless the authorization is terminated or revoked sooner.     Influenza A by PCR NEGATIVE NEGATIVE Final   Influenza B by PCR NEGATIVE NEGATIVE Final    Comment: (NOTE) The Xpert Xpress SARS-CoV-2/FLU/RSV assay is intended as an aid in  the diagnosis of influenza from Nasopharyngeal swab specimens and  should not be used as a sole basis for treatment. Nasal washings and  aspirates are unacceptable for Xpert Xpress SARS-CoV-2/FLU/RSV  testing.  Fact Sheet for Patients: PinkCheek.be  Fact Sheet for Healthcare Providers: GravelBags.it  This test is not yet approved or cleared by the [place] FDA and  has been authorized for detection and/or diagnosis of SARS-CoV-2 by  FDA under an Emergency Use Authorization (EUA). This EUA will remain  in effect (meaning this test can be used) for the duration of the  Covid-19 declaration under Section 564(b)(1) of the Act, 21  U.S.C. section 360bbb-3(b)(1), unless the authorization is  terminated or revoked. Performed at [facility], [place] [address] [place], [place] [address]     RADIOLOGY STUDIES/RESULTS: MR BRAIN WO CONTRAST  Result Date: 06/14/2020 CLINICAL DATA:  Mental status change, unknown cause. Confusion, delirium, and agitation. EXAM: MRI HEAD WITHOUT CONTRAST TECHNIQUE: Multiplanar, multiecho pulse sequences of the brain and surrounding structures were obtained without intravenous contrast. COMPARISON:  Head CT 06/07/2020 and MRI 03/29/2020 FINDINGS: The patient was unable to tolerate the examination despite being medicated. Axial and coronal diffusion, axial FLAIR, and T2* gradient echo sequences were obtained and are motion degraded including severe motion on the gradient echo sequence. No sizable acute infarct is identified on motion degraded diffusion imaging. There is a  small amount of residual hyperintense trace diffusion weighted signal abnormality in the area of left parietal infarction which was acute on the prior MRI and now demonstrates encephalomalacia. There is a subacute right parietal infarct which is new from the prior MRI. Multiple chronic infarcts are again noted including left MCA, bilateral parieto-occipital, and left cerebellar infarcts. No midline shift or extra-axial fluid collection is evident. 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 IMPRESSION: 1. Motion degraded, incomplete examination. 2. Subacute right parietal infarct. 3. No definite acute infarct identified. 4. Multiple chronic infarcts as above. Electronically Signed   By: Logan Bores M.D.   On: 06/14/2020 19:56     LOS: 8 days   Oren Binet, MD  [facility]    To contact the attending provider between 7A-7P or the covering provider during after hours 7P-7A, please log into the web site [email or web address] and access using universal [place] password for that web  site. If you do not have the password, please call the hospital operator.  06/16/2020, 2:02 PM

## 2020-06-17 DIAGNOSIS — F0390 Unspecified dementia without behavioral disturbance: Secondary | ICD-10-CM

## 2020-06-17 LAB — THYROGLOBULIN ANTIBODY: Thyroglobulin Antibody: <1 IU/mL (ref 0.0–0.9)

## 2020-06-17 LAB — GLUCOSE, CAPILLARY
Glucose-Capillary: 155 mg/dL — ABNORMAL HIGH (ref 70–99)
Glucose-Capillary: 211 mg/dL — ABNORMAL HIGH (ref 70–99)
Glucose-Capillary: 161 mg/dL — ABNORMAL HIGH (ref 70–99)
Glucose-Capillary: 99 mg/dL (ref 70–99)

## 2020-06-17 LAB — CSF CELL COUNT WITH DIFFERENTIAL
RBC Count, CSF: 1 /mm3 — ABNORMAL HIGH
RBC Count, CSF: 2 /mm3 — ABNORMAL HIGH
Tube #: 1
Tube #: 4
WBC, CSF: 0 /mm3 (ref 0–5)
WBC, CSF: 1 /mm3 (ref 0–5)

## 2020-06-17 LAB — CRYPTOCOCCAL ANTIGEN, CSF: Crypto Ag: NEGATIVE

## 2020-06-17 LAB — PROTEIN AND GLUCOSE, CSF
Glucose, CSF: 99 mg/dL — ABNORMAL HIGH (ref 40–70)
Total  Protein, CSF: 69 mg/dL — ABNORMAL HIGH (ref 15–45)

## 2020-06-17 LAB — THYROID PEROXIDASE ANTIBODY: Thyroperoxidase Ab SerPl-aCnc: 10 IU/mL (ref 0–34)

## 2020-06-17 MED ORDER — LORAZEPAM 2 MG/ML IJ SOLN: 2.0000 mg | Freq: Once | INTRAMUSCULAR | Status: DC

## 2020-06-17 MED ORDER — POLYETHYLENE GLYCOL 3350 17 G PO PACK
17.0000 g | PACK | Freq: Four times a day (QID) | ORAL | Status: AC
  Administered 2020-06-17 (×3): 17 g via ORAL
  Filled 2020-06-17 (×3): qty 1

## 2020-06-17 MED ORDER — SENNA 8.6 MG PO TABS
2.0000 | ORAL_TABLET | Freq: Every day | ORAL | Status: AC
  Administered 2020-06-17: 17.2 mg via ORAL
  Filled 2020-06-17: qty 2

## 2020-06-17 MED ORDER — FLEET ENEMA 7-19 GM/118ML RE ENEM: 1.0000 | ENEMA | Freq: Every day | RECTAL | Status: DC | PRN

## 2020-06-17 MED ORDER — LORAZEPAM 2 MG/ML IJ SOLN
2.0000 mg | Freq: Once | INTRAMUSCULAR | Status: AC
  Administered 2020-06-17: 2 mg via INTRAVENOUS
  Filled 2020-06-17: qty 1

## 2020-06-17 MED ORDER — BISACODYL 10 MG RE SUPP: 10.0000 mg | Freq: Every day | RECTAL | Status: DC | PRN

## 2020-06-17 NOTE — Procedures (Addendum)
Patient Name: [name]  MRN: [id number]  Epilepsy Attending: Lora Havens  Referring Physician/Provider:  Dr. Rosalin Hawking Duration:  06/16/2020 1113 to 06/17/2020 1113  Patient history: 64yo F with rapidly progressive dementia as well as subacute right parietal infarct. EEG to evaluate for seizure.  Level of alertness: Awake, asleep  AEDs during EEG study: None  Technical aspects: This EEG study was done with scalp electrodes positioned according to the 10-20 International system of electrode placement. Electrical activity was acquired at a sampling rate of 500Hz  and reviewed with a high frequency filter of 70Hz  and a low frequency filter of 1Hz . EEG data were recorded continuously and digitally stored.   Description: The posterior dominant rhythm consists of 8 Hz activity of moderate voltage (25-35 uV) seen predominantly in posterior head regions, symmetric and reactive to eye opening and eye closing.  Sleep was characterized by vertex waves, maximal frontocentral region.  EEG showed continuous generalized 3 to 6 Hz theta-delta slowing.  Sharp transients were also noted in right temporal region. Hyperventilation and photic stimulation were not performed.     Of note, parts of the study were difficult to review due to significant electrode artifact.  ABNORMALITY -Continuous slow, generalized  IMPRESSION: This technically difficult study is suggestive of mild diffuse encephalopathy, nonspecific etiology. No seizures or definite epileptiform discharges were seen throughout the recording.   Gurpreet Mikhail Barbra Sarks

## 2020-06-17 NOTE — Progress Notes (Signed)
EEG maint complete. Reapplied all frontal leads/ head wrap. No skin breakdown noted

## 2020-06-17 NOTE — Progress Notes (Signed)
STROKE TEAM PROGRESS NOTE   INTERVAL HISTORY Her daughter is at the bedside. Confirmed with daughter that pt had rapid decline of cognitive status for the last 3 weeks. Prior to that pt was functional at home. LTM EEG so far no seizure although a lot of artifact. Pan CT no malignancy. Pending LP.  OBJECTIVE Vitals:   06/17/20 0328 06/17/20 0833 06/17/20 1202 06/17/20 1640  BP: 115/79 127/75 115/77 132/81  Pulse: 90 98 100 100  Resp: 18 17 18 17   Temp: 98.6 F (37 C) 98.5 F (36.9 C) 98.4 F (36.9 C) 98 F (36.7 C)  TempSrc: Oral Axillary Oral Oral  SpO2: 95% 98% 97% 99%  Weight:      Height:        CBC:  Recent Labs  Lab 06/14/20 0420  WBC 8.4  HGB 13.7  HCT 41.3  MCV 89.4  PLT 782    Basic Metabolic Panel:  Recent Labs  Lab 06/14/20 0420  NA 140  K 3.5  CL 104  CO2 25  GLUCOSE 154*  BUN 14  CREATININE 0.79  CALCIUM 9.7    Lipid Panel:     Component Value Date/Time   CHOL 149 03/30/2020 0531   TRIG 82 03/30/2020 0531   HDL 38 (L) 03/30/2020 0531   CHOLHDL 3.9 03/30/2020 0531   VLDL 16 03/30/2020 0531   LDLCALC 95 03/30/2020 0531   HgbA1c:  Lab Results  Component Value Date   HGBA1C 6.9 (H) 06/09/2020   Urine Drug Screen:     Component Value Date/Time   LABOPIA NONE DETECTED 06/08/2020 1349   COCAINSCRNUR NONE DETECTED 06/08/2020 1349   LABBENZ POSITIVE (A) 06/08/2020 1349   AMPHETMU NONE DETECTED 06/08/2020 1349   THCU NONE DETECTED 06/08/2020 1349   LABBARB NONE DETECTED 06/08/2020 1349    Alcohol Level No results found for: [place]  MR BRAIN WO CONTRAST 06/14/2020 IMPRESSION:  1. Motion degraded, incomplete examination.  2. Subacute right parietal infarct.  3. No definite acute infarct identified.  4. Multiple chronic infarcts as above.   TEE 04/01/2020 IMPRESSIONS  1. Left ventricular ejection fraction, by estimation, is 60 to 65%. The  left ventricle has normal function. The left ventricle has no regional  wall motion  abnormalities.  2. Right ventricular systolic function is normal. The right ventricular  size is normal.  3. Left atrial size was moderately dilated. No left atrial/left atrial  appendage thrombus was detected.  4. The mitral valve is normal in structure. Mild mitral valve  regurgitation. No evidence of mitral stenosis.  5. Tricuspid valve regurgitation is mild to moderate.  6. The aortic valve is tricuspid. Aortic valve regurgitation is moderate.  Mild to moderate aortic valve sclerosis/calcification is present, without  any evidence of aortic stenosis.  7. The inferior vena cava is normal in size with greater than 50%  respiratory variability, suggesting right atrial pressure of 3 mmHg.  8. Agitated saline contrast bubble study was negative, with no evidence  of any interatrial shunt.   ECG - SR rate 84 BPM. (See cardiology reading for complete details)  PHYSICAL EXAM  Temp:  [98 F (36.7 C)-98.7 F (37.1 C)] 98 F (36.7 C) (11/16 1640) Pulse Rate:  [90-105] 100 (11/16 1640) Resp:  [17-18] 17 (11/16 1640) BP: (107-132)/(63-81) 132/81 (11/16 1640) SpO2:  [95 %-100 %] 99 % (11/16 1640)  General - Well nourished, well developed, restless, altered.  Ophthalmologic - fundi not visualized due to noncooperation.  Cardiovascular -  Regular rhythm and rate.  Neuro - restless, intermittently combative, global aphasia, very limited words output, able to say in her native language "OK,OK", not following commands. Eyes midposition, not tracking but seems to have left gaze preference, not blinking to visual threat bilaterally. No facial asymmetry, moving all extremities strong equally. Sensation, coordination and gait not tested.   ASSESSMENT/PLAN Ms. Wylie Russon is a 64 y.o. female with history of HTN, HLD, DM, recent stroke with complete workup 03/2020 (TEE 04/01/20) returns with imaging showing new subacute stroke. She did not receive IV t-PA due to recent stroke.  Rapid  progressive dementia  AMS with behavior change, gradually worsening for the last 3 weeks  MRI new subacute right parietal infarct since 03/2020. 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 Multiple other chronic infarct including left PCA, b/l MCA/PCA and left cerebellar. However, encephalitis or seizure related changes still in DDx.  LTM EEG no Sz  EEG 11/7 diffuse encephalopathy   B1, B12, ammonia, TSH, FT4, HIV, RPR all neg  Thyroid antibodies pending  Pan CT no malignancy  Will need LP to rule out encephalitis  Stroke: Subacute right parietal infarct - embolic - source unknown.  MRI head - Subacute right parietal infarct. No definite acute infarct identified. Multiple chronic infarcts as above.   Will do CTA head and neck   TEE - 04/01/20 - EF 60 - 65%. No cardiac source of emboli identified.   [employer] Virus 2 - negative  HgbA1c - 6.9  UDS - benzodiazepine  VTE prophylaxis - Lovenox - on hold for LP preparation  aspirin 81 mg daily and clopidogrel 75 mg daily prior to admission, now on aspirin 81 mg daily  Patient counseled to be compliant with her antithrombotic medications  Ongoing aggressive stroke risk factor management  Therapy recommendations:  pending  Disposition:  Pending  Hx of stroke  03/2020 admitted for aphasia and confusion.  CT and MRI showed left frontal parietal infarcts, chronic left frontal, bilateral occipital, left cerebellum infarcts.  MRA unremarkable.  Carotid Doppler negative.  TTE unremarkable.  LDL 94, A1c 6.8.  TEE no source of emboli.  Loop recorder not placed due to no insurance.  Discharged on DAPT and Lipitor 40.  Hx of stroke 5-7 years ago, no details  ? Seizure   06/07/2020, episode of left arm twitching, altered mental status, decreased response and roving eyes.    CT 11/6 no acute abnormality.    EEG 11/7 negative for seizure  Currently LTM EEG no Sz so far  Hypertension  Home BP meds: amlodipine ;  lisinopril  Current BP meds: amlodipine ; labetalol prn  Stable . Long-term BP goal normotensive  Hyperlipidemia  Home Lipid lowering medication: Lipitor 40 mg daily   LDL 94  (03/2020), goal < 70  Current lipid lowering medication: Lipitor 40 mg daily   Continue statin at discharge  Diabetes  Home diabetic meds: metformin  Current diabetic meds: SSI   HgbA1c 6.9, goal < 7.0  CBG monitoring  Other Stroke Risk Factors  Advanced age  Family hx stroke (brother and sister)   Other Active Problems  Code status - Full code  Psych consult  Hospital day # 9  Rosalin Hawking, MD PhD Stroke Neurology 06/17/2020 5:28 PM  To contact Stroke Continuity provider, please refer to [email or web address] After hours, contact General Neurology

## 2020-06-17 NOTE — Procedures (Signed)
Procedure Note: Lumbar puncture  Indications: assess for CNS pathology   Operator: Rosalin Hawking, MD, PhD  Others present: Delorise Jackson, RN  Indications, risks, and benefits explained to patient / surrogate decision maker and informed consent obtained. Time-out was performed, with all individuals present agreeing on the procedure to be performed, the site of procedure, and the patient identity.  Patient positioned, prepped and draped in usual sterile fashion. L3-4 space located using bilateral iliac crests as landmarks. 1% Lidocaine without epinephrine was used to anesthetize the area. An 20G spinal needle was introduced into the sub- arachnoid space. Stylet was removed with appropriate fluid return. Needle removed after adequate fluid collected. Blood loss was minimal. A dry guaze dressing was placed over insertion site. Patient tolerated the procedure well and no complications were observed. Spontaneous movement of bilateral extremities were observed after the procedure.  Opening pressure: not measured  Total fluid removed: 12cc  Color of fluid: clear  Sent for: cell count + diff , gram stain, glucose, protein, crypto antigen, HSV PCR, VDRL, paraneoplastic antibodies, 14-3-3, RT-QuIC, oligoclononal band  Rosalin Hawking, MD PhD Stroke Neurology 06/17/2020 7:33 PM

## 2020-06-17 NOTE — Progress Notes (Signed)
PROGRESS NOTE        PATIENT DETAILS Name: Sonya George Age: 64 y.o. Sex: female Date of Birth: 05-05-1956 Admit Date: 06/07/2020 Admitting Physician Reubin Milan, MD FWY:OVZCHY, Delfino Lovett, NP  Brief Narrative: Patient is a 64 y.o. female with past medical history of HTN, DM, prior CVA-who presented with altered mental status after getting news of her sister's passing.  She was admitted for further work-up-extensive neuroimaging/EEG negative-evaluated by both neurology and psychiatry-initially thought to have functional/stress-induced etiology for altered mental status-however during the hospital stay-observed to have expressive aphasia, fluctuating mental status with delirium-neurology reconsulted-further work-up in progress-see below.  Significant events: 8/28-8/31>> admit to [facility] for work-up of CVA 11/6>> admit to [facility] for confusion  Significant studies: 11/6>> CT head: No acute intracranial abnormality. 11/6>> chest x-ray: No active pulmonary disease. 11/6>> HIV nonreactive 11/7>> vitamin B12 and vitamin B1: Within normal limits 11/7>> RPR: Nonreactive 11/7>> TSH: Within normal limits 11/8>> A1c 6.9 11/7>> vitamin B12: 561 11/13>>MRI Brain:Subacute right parietal infarct. 11/15>> CT chest/abdomen/pelvis: Large stool burden, distended bladder-no acute abnormality in the chest, abdomen or pelvis.  Antimicrobial therapy: None  Microbiology data: None  Procedures : 11/7>>spot EEG: Mild diffuse encephalopathy-nonspecific etiology-no seizures. 11/15-11/16>> LTM EEG: No seizures  Consults: Neurology, psychiatry  DVT Prophylaxis : Lovenox held in anticipation of possible lumbar puncture  Subjective: Unchanged compared to yesterday-still appears unable to express herself-although on the only word she put out today was "yes"  Assessment/Plan: Acute confusional state: Initially thought to be functional-stress response to patient's sister  passing-however she continues to have significant speech abnormalities that is much different than her usual baseline.  Her mental status continues to fluctuate-suspect she at times has delirium-at times has expressive aphasia.  Per daughter-her current mental status (ongoing for approximately 2-3 weeks) is significantly worse than her usual baseline after her most recent CVA (able to converse in a language-and relatively independent with walking).  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  See work-up as above.  Subacute CVA-recent hospitalization for CVA: Unlikely causing her confusion/AMS-continue aspirin/statin.  Had an extensive stroke work-up during her most recent hospitalization.  HLD: Continue statin  HTN: BP stable-continue amlodipine-lisinopril remains on hold.   DM-2 (A1c 6.9 on 11/8): CBG stable-continue SSI  Recent Labs    06/16/20 2115 06/17/20 0605 06/17/20 1206  GLUCAP 137* 155* 211*    Diet: Diet Order            Diet heart healthy/carb modified Room service appropriate? Yes; Fluid consistency: Thin  Diet effective now                  Code Status: Full code   Family Communication: Daughter [name] [phone]) at bedside along with neurology on 11/16.   Disposition Plan: Status is: Inpatient  Remains inpatient appropriate because:Inpatient level of care appropriate due to severity of illness   Dispo: The patient is from: Home              Anticipated d/c is to: TBD              Anticipated d/c date is: 2 days              Patient currently is not medically stable to d/c.   Barriers to Discharge: Continued altered mental status-not yet  at baseline-LTM EEG in progress-LP planned.  Antimicrobial agents: Anti-infectives (From admission, onward)   None       Time spent: 25- minutes-Greater than 50% of this time was spent in  counseling, explanation of diagnosis, planning of further management, and coordination of care.  MEDICATIONS: Scheduled Meds: . amLODipine  10 mg Oral Daily  . aspirin EC  81 mg Oral Daily  . atorvastatin  40 mg Oral q1800  . insulin aspart  0-9 Units Subcutaneous TID WC  . multivitamin with minerals  1 tablet Oral Daily  . polyethylene glycol  17 g Oral Q6H  . QUEtiapine  50 mg Oral QHS   Continuous Infusions: PRN Meds:.acetaminophen **OR** acetaminophen, bisacodyl, diphenhydrAMINE, haloperidol lactate, labetalol, ondansetron **OR** ondansetron (ZOFRAN) IV, sodium phosphate   PHYSICAL EXAM: Vital signs: Vitals:   06/16/20 2350 06/17/20 0328 06/17/20 0833 06/17/20 1202  BP: 107/63 115/79 127/75 115/77  Pulse: (!) 105 90 98 100  Resp: 18 18 17 18   Temp: 98.7 F (37.1 C) 98.6 F (37 C) 98.5 F (36.9 C) 98.4 F (36.9 C)  TempSrc: Oral Oral Axillary Oral  SpO2: 99% 95% 98% 97%  Weight:      Height:       Filed Weights   06/07/20 0840  Weight: 63.5 kg   Body mass index is 22.6 kg/m.   Gen Exam: A bit more alert today-said yes.  Not in any distress. HEENT:atraumatic, normocephalic Chest: B/L clear to auscultation anteriorly CVS:S1S2 regular Abdomen:soft non tender, non distended Extremities: Moving all 4 extremities. Neurology: Non focal Skin: no rash  I have personally reviewed following labs and imaging studies  LABORATORY DATA: CBC: Recent Labs  Lab 06/14/20 0420  WBC 8.4  HGB 13.7  HCT 41.3  MCV 89.4  PLT 962    Basic Metabolic Panel: Recent Labs  Lab 06/14/20 0420  NA 140  K 3.5  CL 104  CO2 25  GLUCOSE 154*  BUN 14  CREATININE 0.79  CALCIUM 9.7    GFR: Estimated Creatinine Clearance: 65.6 mL/min (by C-G formula based on SCr of 0.79 mg/dL).  Liver Function Tests: Recent Labs  Lab 06/14/20 0420  AST 27  ALT 20  ALKPHOS 59  BILITOT 0.9  PROT 6.5  ALBUMIN 3.2*   No results for input(s): LIPASE, AMYLASE in the last 168  hours. Recent Labs  Lab 06/14/20 0420  AMMONIA 15    Coagulation Profile: No results for input(s): INR, PROTIME in the last 168 hours.  Cardiac Enzymes: No results for input(s): CKTOTAL, CKMB, CKMBINDEX, TROPONINI in the last 168 hours.  BNP (last 3 results) No results for input(s): PROBNP in the last 8760 hours.  Lipid Profile: No results for input(s): CHOL, HDL, LDLCALC, TRIG, CHOLHDL, LDLDIRECT in the last 72 hours.  Thyroid Function Tests: Recent Labs    06/16/20 1305  FREET4 1.05    Anemia Panel: No results for input(s): VITAMINB12, FOLATE, FERRITIN, TIBC, IRON, RETICCTPCT in the last 72 hours.  Urine analysis:    Component Value Date/Time   COLORURINE STRAW (A) 06/08/2020 1349   APPEARANCEUR CLEAR 06/08/2020 1349   LABSPEC 1.015 06/08/2020 1349   PHURINE 8.0 06/08/2020 1349   GLUCOSEU 100 (A) 06/08/2020 1349   HGBUR NEGATIVE 06/08/2020 1349   BILIRUBINUR NEGATIVE 06/08/2020 1349   KETONESUR 15 (A) 06/08/2020 1349   PROTEINUR NEGATIVE 06/08/2020 1349   NITRITE NEGATIVE 06/08/2020 1349   LEUKOCYTESUR NEGATIVE 06/08/2020 1349    Sepsis Labs: Lactic Acid, Venous No results found for:  LATICACIDVEN  MICROBIOLOGY: No results found for this or any previous visit (from the past 240 hour(s)).  RADIOLOGY STUDIES/RESULTS: CT CHEST ABDOMEN PELVIS W CONTRAST  Result Date: 06/16/2020 CLINICAL DATA:  64 year old female with encephalopathy. EXAM: CT CHEST, ABDOMEN, AND PELVIS WITH CONTRAST TECHNIQUE: Multidetector CT imaging of the chest, abdomen and pelvis was performed following the standard protocol during bolus administration of intravenous contrast. CONTRAST:  154mL OMNIPAQUE IOHEXOL 300 MG/ML  SOLN COMPARISON:  Portable chest x-ray 06/07/2020. Abdominal radiographs 03/23/2019. FINDINGS: CT CHEST FINDINGS Cardiovascular: Tortuous thoracic aorta. Mild aortic atherosclerosis. Grossly patent main pulmonary arteries. Mild cardiomegaly. No pericardial effusion.  Mediastinum/Nodes: Negative.  No mediastinal lymphadenopathy. Lungs/Pleura: Major airways are patent. Mild atelectasis at the left lung base. Minimal atelectasis in the right costophrenic angle. Similar mild atelectasis or scarring in the medial segment of the right middle lobe. No pleural effusion. Musculoskeletal: Mild chronic appearing superior T1 endplate deformity, possibly due to Schmorl's node. Previous left humerus ORIF with streak artifact. Small right C7 cervical rib (normal variant). Chronic left 2nd through 6th rib fractures, the 2nd rib fracture is ununited. No acute osseous abnormality identified. CT ABDOMEN PELVIS FINDINGS Hepatobiliary: Negative liver and gallbladder. No bile duct enlargement. Pancreas: Negative. Spleen: Diminutive, negative. Adrenals/Urinary Tract: Normal adrenal glands. Multiple bilateral areas of chronic renal cortical scarring/atrophy. But symmetric renal enhancement and contrast excretion. No renal mass or nephrolithiasis identified. No hydronephrosis. Decompressed ureters. Distended urinary bladder, estimated bladder volume 970 mL. No perivesical stranding. Stomach/Bowel: Large stool ball in the rectum (sagittal image 67). No perirectal inflammation. Decompressed sigmoid colon is redundant. Decompressed descending colon and redundant splenic flexure. Mild retained stool in the right and transverse colon. Normal appendix (series 3, image 85). No large bowel inflammation. Negative terminal ileum. No dilated small bowel. Decompressed stomach. Negative duodenum. No free air, free fluid, mesenteric stranding. Vascular/Lymphatic: Tortuous abdominal aorta with mild atherosclerosis. Major arterial structures remain patent. Grossly patent portal venous system. No lymphadenopathy. Reproductive: Negative. Other: No pelvic free fluid. Small ventral abdominal wall subcutaneous injection sites suspected on series 3, image 90. Musculoskeletal: Grade 1 anterolisthesis of L5 on S1. No pars  fracture. Degenerative lumbar disc bulging elsewhere. No acute osseous abnormality identified. IMPRESSION: 1. Large stool ball in the rectum suggests fecal impaction. No associated bowel obstruction or inflammation. 2. Distended urinary bladder (estimated volume 970 mL) compatible with urinary retention. Bilateral chronic renal cortical scarring but no hydronephrosis. 3. No other acute or inflammatory process identified in the chest, abdomen, or pelvis. 4. Tortuous aorta. Chronic left rib fractures. Degenerative lumbosacral spondylolisthesis. Electronically Signed   By: Genevie Ann M.D.   On: 06/16/2020 15:20   Overnight EEG with video  Result Date: 06/17/2020 Lora Havens, MD     06/17/2020  9:07 AM Patient Name: Cynai Skeens MRN: [id number] Epilepsy Attending: Lora Havens Referring Physician/Provider:  Dr. Rosalin Hawking Duration:  06/16/2020 1113 to 06/17/2020 0800  Patient history: 64yo F with rapidly progressive dementia as well as subacute right parietal infarct. EEG to evaluate for seizure.  Level of alertness: Awake, asleep  AEDs during EEG study: None  Technical aspects: This EEG study was done with scalp electrodes positioned according to the 10-20 International system of electrode placement. Electrical activity was acquired at a sampling rate of 500Hz  and reviewed with a high frequency filter of 70Hz  and a low frequency filter of 1Hz . EEG data were recorded continuously and digitally stored.  Description: The posterior dominant rhythm consists of 8 Hz activity of moderate voltage (25-35  uV) seen predominantly in posterior head regions, symmetric and reactive to eye opening and eye closing.  Sleep was characterized by vertex waves, maximal frontocentral region.  EEG showed continuous generalized 3 to 6 Hz theta-delta slowing.  Sharp transients were also noted in right temporal region. Hyperventilation and photic stimulation were not performed.   Of note, parts of the study were difficult to  review due to significant electrode artifact.  ABNORMALITY -Continuous slow, generalized  IMPRESSION: This technically difficult study is suggestive of mild diffuse encephalopathy, nonspecific etiology. No seizures or definite epileptiform discharges were seen throughout the recording.   Lora Havens     LOS: 9 days   Oren Binet, MD  [facility]    To contact the attending provider between 7A-7P or the covering provider during after hours 7P-7A, please log into the web site [email or web address] and access using universal [place] password for that web site. If you do not have the password, please call the hospital operator.  06/17/2020, 1:22 PM

## 2020-06-18 LAB — GLUCOSE, CAPILLARY
Glucose-Capillary: 159 mg/dL — ABNORMAL HIGH (ref 70–99)
Glucose-Capillary: 175 mg/dL — ABNORMAL HIGH (ref 70–99)
Glucose-Capillary: 124 mg/dL — ABNORMAL HIGH (ref 70–99)
Glucose-Capillary: 149 mg/dL — ABNORMAL HIGH (ref 70–99)

## 2020-06-18 MED ORDER — ENOXAPARIN SODIUM 40 MG/0.4ML ~~LOC~~ SOLN
40.0000 mg | SUBCUTANEOUS | Status: DC
  Administered 2020-06-19 – 2020-06-25 (×7): 40 mg via SUBCUTANEOUS
  Filled 2020-06-18 (×7): qty 0.4

## 2020-06-18 MED ORDER — SODIUM CHLORIDE 0.9 % IV SOLN
1000.0000 mg | Freq: Every day | INTRAVENOUS | Status: AC
  Administered 2020-06-18 – 2020-06-22 (×5): 1000 mg via INTRAVENOUS
  Filled 2020-06-18 (×5): qty 8

## 2020-06-18 MED ORDER — SODIUM CHLORIDE 0.9 % IV SOLN
INTRAVENOUS | Status: DC | PRN
  Administered 2020-06-18: 250 mL via INTRAVENOUS

## 2020-06-18 MED ORDER — CHLORHEXIDINE GLUCONATE CLOTH 2 % EX PADS
6.0000 | MEDICATED_PAD | Freq: Every day | CUTANEOUS | Status: DC
  Administered 2020-06-18 – 2020-06-25 (×6): 6 via TOPICAL

## 2020-06-18 MED ORDER — ASPIRIN EC 325 MG PO TBEC
325.0000 mg | DELAYED_RELEASE_TABLET | Freq: Every day | ORAL | Status: DC
  Filled 2020-06-18 (×2): qty 1

## 2020-06-18 MED ORDER — POLYETHYLENE GLYCOL 3350 17 G PO PACK
17.0000 g | PACK | Freq: Every day | ORAL | Status: AC
  Administered 2020-06-19 – 2020-06-21 (×3): 17 g via ORAL
  Filled 2020-06-18 (×3): qty 1

## 2020-06-18 NOTE — Progress Notes (Addendum)
PROGRESS NOTE        PATIENT DETAILS Name: Sonya George Age: 64 y.o. Sex: female Date of Birth: March 22, 1956 Admit Date: 06/07/2020 Admitting Physician Reubin Milan, MD OXB:[id number], Delfino Lovett, NP  Brief Narrative: Patient is a 64 y.o. female with past medical history of HTN, DM, prior CVA-who presented with altered mental status after getting news of her sister's passing.  She was admitted for further work-up-extensive neuroimaging/EEG negative-evaluated by both neurology and psychiatry-initially thought to have functional/stress-induced etiology for altered mental status-however during the hospital stay-observed to have expressive aphasia, fluctuating mental status with delirium-neurology reconsulted-further work-up in progress-see below.  Significant events: 8/28-8/31>> admit to [facility] for work-up of CVA 11/6>> admit to [facility] for confusion  Significant studies: 11/6>> CT head: No acute intracranial abnormality. 11/6>> chest x-ray: No active pulmonary disease. 11/6>> HIV nonreactive 11/7>> vitamin B12 and vitamin B1: Within normal limits 11/7>> RPR: Nonreactive 11/7>> TSH: Within normal limits 11/8>> A1c 6.9 11/7>> vitamin B12: 561 11/13>>MRI Brain:Subacute right parietal infarct. 11/15>> CT chest/abdomen/pelvis: Large stool burden, distended bladder-no acute abnormality in the chest, abdomen or pelvis.  Antimicrobial therapy: None  Microbiology data: 11/16>> CSF culture: Negative so far 11/16>> VDRL CSF, HSV PCR CHF, autoimmune panel, oligoclonal bands: Pending  Procedures : 11/7>>spot EEG: Mild diffuse encephalopathy-nonspecific etiology-no seizures. 11/15-11/16>> LTM EEG: No seizures 11/16>> LP by neurology  Consults: Neurology, psychiatry  DVT Prophylaxis : Lovenox   Subjective: Unchanged compared to yesterday-mostly alert-attempts to communicate-appears to still have some amount of expressive aphasia.  Assessment/Plan: Acute  encephalopathy: Initially thought to be functional-stress response to patient's sister passing-however she continues to have significant speech abnormalities that is much different than her usual baseline.  Her mental status continues to fluctuate-suspect she at times has delirium-at times has expressive aphasia.  Per daughter-her current mental status (ongoing for approximately 2-3 weeks) is significantly worse than her usual baseline after her most recent CVA (able to converse in a language-and relatively independent with walking).  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    Subacute CVA-recent hospitalization for CVA: Unlikely causing her confusion/AMS-continue aspirin/statin.  Had an extensive stroke work-up during her most recent hospitalization.  HLD: Continue statin  HTN: BP stable-continue amlodipine-lisinopril remains on hold.   DM-2 (A1c 6.9 on 11/8): CBG stable-continue SSI  Recent Labs    06/17/20 2113 06/18/20 0623 06/18/20 1137  GLUCAP 99 159* 175*    Diet: Diet Order            Diet heart healthy/carb modified Room service appropriate? Yes; Fluid consistency: Thin  Diet effective now                  Code Status: Full code   Family Communication: Daughter Sonya George [phone]) at bedside on 11/17   Disposition Plan: Status is: Inpatient  Remains inpatient appropriate because:Inpatient level of care appropriate due to severity of illness   Dispo: The patient is from: Home              Anticipated d/c is to: TBD              Anticipated d/c date is: 2 days              Patient currently is not medically stable to d/c.  Barriers to Discharge: Continued altered mental status-not yet at baseline-LTM EEG in progress-LP planned.  Antimicrobial  agents: Anti-infectives (From admission, onward)   None       Time spent: 25- minutes-Greater than 50% of this time was spent in counseling, explanation of diagnosis, planning of further management, and coordination of care.  MEDICATIONS: Scheduled Meds: . amLODipine  10 mg Oral Daily  . aspirin EC  81 mg Oral Daily  . atorvastatin  40 mg Oral q1800  . insulin aspart  0-9 Units Subcutaneous TID WC  . multivitamin with minerals  1 tablet Oral Daily  . [START ON 06/19/2020] polyethylene glycol  17 g Oral Daily  . QUEtiapine  50 mg Oral QHS   Continuous Infusions: PRN Meds:.acetaminophen **OR** acetaminophen, bisacodyl, diphenhydrAMINE, haloperidol lactate, labetalol, ondansetron **OR** ondansetron (ZOFRAN) IV, sodium phosphate   PHYSICAL EXAM: Vital signs: Vitals:   06/17/20 2024 06/18/20 0001 06/18/20 0314 06/18/20 0753  BP: 126/69 124/73 106/68 (!) 122/110  Pulse: 95 98 84 99  Resp: 18 18 19 20   Temp: 98.5 F (36.9 C) 98.5 F (36.9 C) 97.9 F (36.6 C) 97.9 F (36.6 C)  TempSrc: Oral Oral Oral Oral  SpO2: 100% 100% 94% 96%  Weight:      Height:       Filed Weights   06/07/20 0840  Weight: 63.5 kg   Body mass index is 22.6 kg/m.   Gen Exam: Not in any distress HEENT:atraumatic, normocephalic Chest: B/L clear to auscultation anteriorly CVS:S1S2 regular Abdomen:soft non tender, non distended Extremities:no edema Neurology: Non focal Skin: no rash  I have personally reviewed following labs and imaging studies  LABORATORY DATA: CBC: Recent Labs  Lab 06/14/20 0420  WBC 8.4  HGB 13.7  HCT 41.3  MCV 89.4  PLT 341    Basic Metabolic Panel: Recent Labs  Lab 06/14/20 0420  NA 140  K 3.5  CL 104  CO2 25  GLUCOSE 154*  BUN 14  CREATININE 0.79  CALCIUM 9.7    GFR: Estimated Creatinine Clearance: 65.6 mL/min (by C-G formula based on SCr of 0.79 mg/dL).  Liver Function Tests: Recent Labs  Lab 06/14/20 0420  AST 27  ALT 20  ALKPHOS 59   BILITOT 0.9  PROT 6.5  ALBUMIN 3.2*   No results for input(s): LIPASE, AMYLASE in the last 168 hours. Recent Labs  Lab 06/14/20 0420  AMMONIA 15    Coagulation Profile: No results for input(s): INR, PROTIME in the last 168 hours.  Cardiac Enzymes: No results for input(s): CKTOTAL, CKMB, CKMBINDEX, TROPONINI in the last 168 hours.  BNP (last 3 results) No results for input(s): PROBNP in the last 8760 hours.  Lipid Profile: No results for input(s): CHOL, HDL, LDLCALC, TRIG, CHOLHDL, LDLDIRECT in the last 72 hours.  Thyroid Function Tests: Recent Labs    06/16/20 1305  FREET4 1.05    Anemia Panel: No results for input(s): VITAMINB12, FOLATE, FERRITIN, TIBC, IRON, RETICCTPCT in the last 72 hours.  Urine analysis:    Component Value Date/Time   COLORURINE STRAW (A) 06/08/2020 1349   APPEARANCEUR CLEAR 06/08/2020 1349   LABSPEC 1.015 06/08/2020 1349   PHURINE 8.0 06/08/2020 1349   GLUCOSEU 100 (A) 06/08/2020 1349   HGBUR NEGATIVE 06/08/2020 1349   BILIRUBINUR NEGATIVE 06/08/2020 1349   KETONESUR 15 (A) 06/08/2020 1349   PROTEINUR NEGATIVE 06/08/2020 1349   NITRITE NEGATIVE 06/08/2020 1349   LEUKOCYTESUR NEGATIVE 06/08/2020 1349    Sepsis Labs: Lactic Acid, Venous No results found  for: LATICACIDVEN  MICROBIOLOGY: Recent Results (from the past 240 hour(s))  CSF culture     Status: None (Preliminary result)   Collection Time: 06/17/20  7:08 PM   Specimen: CSF  Result Value Ref Range Status   Specimen Description CSF  Final   Special Requests NONE  Final   Gram Stain NO WBC SEEN NO ORGANISMS SEEN   Final   Culture   Final    NO GROWTH < 12 HOURS Performed at [facility], [address], [place], [place] [address]    Report Status PENDING  Incomplete    RADIOLOGY STUDIES/RESULTS: CT CHEST ABDOMEN PELVIS W CONTRAST  Result Date: 06/16/2020 CLINICAL DATA:  64 year old female with encephalopathy. EXAM: CT CHEST, ABDOMEN, AND PELVIS WITH CONTRAST  TECHNIQUE: Multidetector CT imaging of the chest, abdomen and pelvis was performed following the standard protocol during bolus administration of intravenous contrast. CONTRAST:  171mL OMNIPAQUE IOHEXOL 300 MG/ML  SOLN COMPARISON:  Portable chest x-ray 06/07/2020. Abdominal radiographs 03/23/2019. FINDINGS: CT CHEST FINDINGS Cardiovascular: Tortuous thoracic aorta. Mild aortic atherosclerosis. Grossly patent main pulmonary arteries. Mild cardiomegaly. No pericardial effusion. Mediastinum/Nodes: Negative.  No mediastinal lymphadenopathy. Lungs/Pleura: Major airways are patent. Mild atelectasis at the left lung base. Minimal atelectasis in the right costophrenic angle. Similar mild atelectasis or scarring in the medial segment of the right middle lobe. No pleural effusion. Musculoskeletal: Mild chronic appearing superior T1 endplate deformity, possibly due to Schmorl's node. Previous left humerus ORIF with streak artifact. Small right C7 cervical rib (normal variant). Chronic left 2nd through 6th rib fractures, the 2nd rib fracture is ununited. No acute osseous abnormality identified. CT ABDOMEN PELVIS FINDINGS Hepatobiliary: Negative liver and gallbladder. No bile duct enlargement. Pancreas: Negative. Spleen: Diminutive, negative. Adrenals/Urinary Tract: Normal adrenal glands. Multiple bilateral areas of chronic renal cortical scarring/atrophy. But symmetric renal enhancement and contrast excretion. No renal mass or nephrolithiasis identified. No hydronephrosis. Decompressed ureters. Distended urinary bladder, estimated bladder volume 970 mL. No perivesical stranding. Stomach/Bowel: Large stool ball in the rectum (sagittal image 67). No perirectal inflammation. Decompressed sigmoid colon is redundant. Decompressed descending colon and redundant splenic flexure. Mild retained stool in the right and transverse colon. Normal appendix (series 3, image 85). No large bowel inflammation. Negative terminal ileum. No dilated  small bowel. Decompressed stomach. Negative duodenum. No free air, free fluid, mesenteric stranding. Vascular/Lymphatic: Tortuous abdominal aorta with mild atherosclerosis. Major arterial structures remain patent. Grossly patent portal venous system. No lymphadenopathy. Reproductive: Negative. Other: No pelvic free fluid. Small ventral abdominal wall subcutaneous injection sites suspected on series 3, image 90. Musculoskeletal: Grade 1 anterolisthesis of L5 on S1. No pars fracture. Degenerative lumbar disc bulging elsewhere. No acute osseous abnormality identified. IMPRESSION: 1. Large stool ball in the rectum suggests fecal impaction. No associated bowel obstruction or inflammation. 2. Distended urinary bladder (estimated volume 970 mL) compatible with urinary retention. Bilateral chronic renal cortical scarring but no hydronephrosis. 3. No other acute or inflammatory process identified in the chest, abdomen, or pelvis. 4. Tortuous aorta. Chronic left rib fractures. Degenerative lumbosacral spondylolisthesis. Electronically Signed   By: Genevie Ann M.D.   On: 06/16/2020 15:20   Overnight EEG with video  Result Date: 06/17/2020 Lora Havens, MD     06/18/2020  8:27 AM Patient Name: Rodolfo Notaro MRN: [id number] Epilepsy Attending: Lora Havens Referring Physician/Provider:  Dr. Rosalin Hawking Duration:  06/16/2020 1113 to 06/17/2020 1113  Patient history: 64yo F with rapidly progressive dementia as well as subacute right parietal infarct.  EEG to evaluate for seizure.  Level of alertness: Awake, asleep  AEDs during EEG study: None  Technical aspects: This EEG study was done with scalp electrodes positioned according to the 10-20 International system of electrode placement. Electrical activity was acquired at a sampling rate of 500Hz  and reviewed with a high frequency filter of 70Hz  and a low frequency filter of 1Hz . EEG data were recorded continuously and digitally stored.  Description: The posterior  dominant rhythm consists of 8 Hz activity of moderate voltage (25-35 uV) seen predominantly in posterior head regions, symmetric and reactive to eye opening and eye closing.  Sleep was characterized by vertex waves, maximal frontocentral region.  EEG showed continuous generalized 3 to 6 Hz theta-delta slowing.  Sharp transients were also noted in right temporal region. Hyperventilation and photic stimulation were not performed.   Of note, parts of the study were difficult to review due to significant electrode artifact.  ABNORMALITY -Continuous slow, generalized  IMPRESSION: This technically difficult study is suggestive of mild diffuse encephalopathy, nonspecific etiology. No seizures or definite epileptiform discharges were seen throughout the recording.   [place]     LOS: 10 days   Oren Binet, MD  [facility]    To contact the attending provider between 7A-7P or the covering provider during after hours 7P-7A, please log into the web site [email or web address] and access using universal Micro password for that web site. If you do not have the password, please call the hospital operator.  06/18/2020, 2:49 PM

## 2020-06-18 NOTE — Progress Notes (Signed)
Physical Therapy Treatment Patient Details Name: Moyinoluwa Dawe MRN: [id number] DOB: May 10, 1956 Today's Date: 06/18/2020    History of Present Illness 64 y.o. female admitted with AMS with difficulty communicating. 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 pt noncompliant spits out medications PMH CVA HTN DM seizures    PT Comments    Pt has shown progress since her most recent PT session. She was able to sit EOB with min guard assist until she would fatigue and lean posteriorly resulting in her requiring minA intermittently. In addition, she was able to successfully come to stand 3x this date with modAx2 and maintain static standing position for short bouts with 2 HHA. As she would fatigue she would flex at her hips and knees. She continues to demonstrate deficits in cognition, comprehension, and communication, impacting her progress. She is impulsive and even pulled an EEG line off during the session this date, thus notified nurse and donned mitts at end of session. Held off on ambulating and transferring OOB to chair due to having continuous EEG on and pt is impulsive. Will continue to follow acutely. Current recommendations remain appropriate.  Follow Up Recommendations  SNF;Supervision/Assistance - 24 hour     Equipment Recommendations  Wheelchair (measurements PT);Wheelchair cushion (measurements PT);Hospital bed;3in1 (PT);Rolling walker with 5" wheels    Recommendations for Other Services       Precautions / Restrictions Precautions Precautions: Fall;Other (comment) Precaution Comments: continuous EEG, tele safety sitter, mittens donned at end of session per RN request due to pt attempting to pull EEG wires off head Restrictions Weight Bearing Restrictions: No    Mobility  Bed Mobility Overal bed mobility: Needs Assistance Bed Mobility: Supine to Sit;Sit to Supine     Supine to sit: Max assist;+2 for physical assistance Sit to supine: Max assist;+2 for physical  assistance   General bed mobility comments: Pt would activate cure with supine to sit transitions once physical assistance was provided to initiate, but required maxAx2 to complete due to poor ability to follow commands.  Transfers Overall transfer level: Needs assistance Equipment used: 2 person hand held assist Transfers: Sit to/from Stand Sit to Stand: Mod assist;+2 physical assistance         General transfer comment: sit to stand 3x from EOB, holding static standing with 2 HHA for bouts of ~10-30 sec each, with bilat knee block and cues to extend hips, with min-no success in following cues. Pt would demonstrate participation once physical assistance was provided to initiate transfers.  Ambulation/Gait             General Gait Details: Deferred due to safety concerns and continuous EEG donned   Stairs             Wheelchair Mobility    Modified Rankin (Stroke Patients Only)       Balance Overall balance assessment: Needs assistance Sitting-balance support: Bilateral upper extremity supported;Feet supported Sitting balance-Leahy Scale: Poor Sitting balance - Comments: Required bilat UE support on bed to maintain static sitting balance with min guard assist initially, but minA intermittently as pt would fatigue and lean posteriorly. Postural control: Posterior lean Standing balance support: Bilateral upper extremity supported Standing balance-Leahy Scale: Poor Standing balance comment: ModAx2 to maintain static standing with bilat knee block and 2 HHA and cues to extend hips, without success, x3 bouts of ~10-30 sec each. Pt would fatigue and flex at trunk.  Cognition Arousal/Alertness: Awake/alert Behavior During Therapy: Restless Overall Cognitive Status: Difficult to assess Area of Impairment: Attention;Following commands;Safety/judgement;Awareness                   Current Attention Level: Alternating    Following Commands: Follows one step commands inconsistently;Follows one step commands with increased time Safety/Judgement: Decreased awareness of safety;Decreased awareness of deficits Awareness: Intellectual   General Comments: Pt requires extra time and max verbal and tactile cues along with assistance to initiate tasks for pt to participate. Pt keeping eyes closed majority of session and mumbling indiscernible words.      Exercises      General Comments        Pertinent Vitals/Pain Pain Assessment: Faces Faces Pain Scale: No hurt Pain Location: no signs or compliants of pain with session Pain Intervention(s): Limited activity within patient's tolerance;Monitored during session;Repositioned    Home Living                      Prior Function            PT Goals (current goals can now be found in the care plan section) Acute Rehab PT Goals Patient Stated Goal: Pt's daughter desires for pt to improve functionally PT Goal Formulation: With patient/family Time For Goal Achievement: 06/23/20 Potential to Achieve Goals: Fair Progress towards PT goals: Progressing toward goals    Frequency    Min 3X/week      PT Plan Current plan remains appropriate    Co-evaluation              AM-PAC PT "6 Clicks" Mobility   Outcome Measure  Help needed turning from your back to your side while in a flat bed without using bedrails?: A Lot Help needed moving from lying on your back to sitting on the side of a flat bed without using bedrails?: A Lot Help needed moving to and from a bed to a chair (including a wheelchair)?: A Lot Help needed standing up from a chair using your arms (e.g., wheelchair or bedside chair)?: A Lot Help needed to walk in hospital room?: A Lot Help needed climbing 3-5 steps with a railing? : Total 6 Click Score: 11    End of Session Equipment Utilized During Treatment: Gait belt Activity Tolerance: Patient tolerated treatment  well Patient left: in bed;with call bell/phone within reach;with bed alarm set;with restraints reapplied;with family/visitor present (daughter present, mitts reapplied) Nurse Communication: Mobility status;Other (comment) (pt pulled EEG line off during activity) PT Visit Diagnosis: Muscle weakness (generalized) (M62.81);Difficulty in walking, not elsewhere classified (R26.2);Other abnormalities of gait and mobility (R26.89);Unsteadiness on feet (R26.81)     Time: 4825-0037 PT Time Calculation (min) (ACUTE ONLY): 19 min  Charges:  $Therapeutic Activity: 8-22 mins                     Moishe Spice, PT, DPT Acute Rehabilitation Services  Pager: [phone] Office: [place] 06/18/2020, 10:01 AM

## 2020-06-18 NOTE — Procedures (Addendum)
Patient Name:Sonya George SJG:[id number] Epilepsy Attending:Danyka Merlin Barbra Sarks Referring Physician/Provider: Dr. Rosalin Hawking Duration: 06/17/2020 1113 to 06/18/2020 1113  Patient history:64yo F with rapidly progressive dementia as well as subacute right parietal infarct. EEG to evaluate for seizure.  Level of alertness:Awake, asleep  AEDs during EEG study:None  Technical aspects: This EEG study was done with scalp electrodes positioned according to the 10-20 International system of electrode placement. Electrical activity was acquired at a sampling rate of 500Hz  and reviewed with a high frequency filter of 70Hz  and a low frequency filter of 1Hz . EEG data were recorded continuously and digitally stored.   Description: The posterior dominant rhythm consists of8Hz  activity of moderate voltage (25-35 uV) seen predominantly in posterior head regions, symmetric and reactive to eye opening and eye closing.  Sleep was characterized by vertex waves, maximal frontocentral region.  EEG showed continuous generalized 3 to 6 Hz theta-delta slowing.  Hyperventilation and photic stimulation were not performed.   Of note, parts of the study were difficult to review due to significant electrode artifact.  ABNORMALITY -Continuousslow, generalized  IMPRESSION: This technically difficult study is suggestive of mild diffuse encephalopathy, nonspecific etiology.No seizures or definite epileptiform discharges were seen throughout the recording.   Wylodean Shimmel Barbra Sarks

## 2020-06-18 NOTE — Progress Notes (Signed)
Bladder scan showed over 700 mL. Pt. up to [facility] but unable to void on her own. Dr. Sloan Leiter notified and received order to leave foley catheter in place.

## 2020-06-18 NOTE — Progress Notes (Signed)
STROKE TEAM PROGRESS NOTE   INTERVAL HISTORY No family is at the bedside. Pt still rapid progress dementia picture. No improvement. CSF showed WBC 0-1 but protein at 69. EEG no seizure, will discontinue. Discussed with Dr. Cheral Marker, will empiric treat with solumedrol 1g daily x 5 days. Will do MRI with and without contrast with sedation.   OBJECTIVE Vitals:   06/18/20 0314 06/18/20 0753 06/18/20 1300 06/18/20 1648  BP: 106/68 (!) 122/110 (!) 152/55 123/71  Pulse: 84 99 88 98  Resp: 19 20 20 18   Temp: 97.9 F (36.6 C) 97.9 F (36.6 C) 97.7 F (36.5 C) 98.4 F (36.9 C)  TempSrc: Oral Oral Oral Oral  SpO2: 94% 96% 96% 99%  Weight:      Height:        CBC:  Recent Labs  Lab 06/14/20 0420  WBC 8.4  HGB 13.7  HCT 41.3  MCV 89.4  PLT 431    Basic Metabolic Panel:  Recent Labs  Lab 06/14/20 0420  NA 140  K 3.5  CL 104  CO2 25  GLUCOSE 154*  BUN 14  CREATININE 0.79  CALCIUM 9.7    Lipid Panel:     Component Value Date/Time   CHOL 149 03/30/2020 0531   TRIG 82 03/30/2020 0531   HDL 38 (L) 03/30/2020 0531   CHOLHDL 3.9 03/30/2020 0531   VLDL 16 03/30/2020 0531   LDLCALC 95 03/30/2020 0531   HgbA1c:  Lab Results  Component Value Date   HGBA1C 6.9 (H) 06/09/2020   Urine Drug Screen:     Component Value Date/Time   LABOPIA NONE DETECTED 06/08/2020 1349   COCAINSCRNUR NONE DETECTED 06/08/2020 1349   LABBENZ POSITIVE (A) 06/08/2020 1349   AMPHETMU NONE DETECTED 06/08/2020 1349   THCU NONE DETECTED 06/08/2020 1349   LABBARB NONE DETECTED 06/08/2020 1349    Alcohol Level No results found for: [place]  MR BRAIN WO CONTRAST 06/14/2020 IMPRESSION:  1. Motion degraded, incomplete examination.  2. Subacute right parietal infarct.  3. No definite acute infarct identified.  4. Multiple chronic infarcts as above.   TEE 04/01/2020 IMPRESSIONS  1. Left ventricular ejection fraction, by estimation, is 60 to 65%. The  left ventricle has normal function.  The left ventricle has no regional  wall motion abnormalities.  2. Right ventricular systolic function is normal. The right ventricular  size is normal.  3. Left atrial size was moderately dilated. No left atrial/left atrial  appendage thrombus was detected.  4. The mitral valve is normal in structure. Mild mitral valve  regurgitation. No evidence of mitral stenosis.  5. Tricuspid valve regurgitation is mild to moderate.  6. The aortic valve is tricuspid. Aortic valve regurgitation is moderate.  Mild to moderate aortic valve sclerosis/calcification is present, without  any evidence of aortic stenosis.  7. The inferior vena cava is normal in size with greater than 50%  respiratory variability, suggesting right atrial pressure of 3 mmHg.  8. Agitated saline contrast bubble study was negative, with no evidence  of any interatrial shunt.   ECG - SR rate 84 BPM. (See cardiology reading for complete details)  PHYSICAL EXAM  Temp:  [97.7 F (36.5 C)-98.5 F (36.9 C)] 98.4 F (36.9 C) (11/17 1648) Pulse Rate:  [84-99] 98 (11/17 1648) Resp:  [18-20] 18 (11/17 1648) BP: (106-152)/(55-110) 123/71 (11/17 1648) SpO2:  [94 %-100 %] 99 % (11/17 1648)  General - Well nourished, well developed, restless, altered.  Ophthalmologic - fundi not visualized  due to noncooperation.  Cardiovascular - Regular rhythm and rate.  Neuro - restless, intermittently combative, global aphasia, very limited words output, able to say "yes, yes, yes", not following commands. Eyes midposition, not tracking but seems to have left gaze preference, not blinking to visual threat bilaterally. No facial asymmetry, moving all extremities strong equally. Sensation, coordination and gait not tested. Positive grip reflex.   ASSESSMENT/PLAN [name] is a 64 y.o. female with history of HTN, HLD, DM, recent stroke with complete workup 03/2020 (TEE 04/01/20) returns with imaging showing new subacute stroke. She  did not receive IV t-PA due to recent stroke.  Rapid progressive dementia  AMS with behavior change, gradually worsening for the last 3 weeks  MRI new subacute right parietal infarct since 03/2020. 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 Multiple other chronic infarct including left PCA, b/l MCA/PCA and left cerebellar. 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  LTM EEG no Sz  EEG 11/7 diffuse encephalopathy   B1, B12, ammonia, TSH, FT4, HIV, RPR all neg  Thyroid antibodies neg  Pan CT no malignancy  LP WBC 0-1, protein 69 and glucose 99  Paraneoplastic panel pending  14-3-3 and RT QuIC pending  Discussed with Dr. Cheral Marker, will empiric treatment with solumedrol 1g x 5 days  Will do MRI with and without contrast and MRA with sedation  Stroke: Subacute right parietal infarct - embolic - source unknown.  MRI head - Subacute right parietal infarct. No definite acute infarct identified. Multiple chronic infarcts as above.   Will do MRI and MRA with sedation  TEE - 04/01/20 - EF 60 - 65%. No cardiac source of emboli identified.   [employer] Virus 2 - negative  HgbA1c - 6.9  UDS - benzodiazepine  VTE prophylaxis - Lovenox  aspirin 81 mg daily and clopidogrel 75 mg daily prior to admission, now on aspirin 325 mg daily. No DAPT in case further procedure  Patient counseled to be compliant with her antithrombotic medications  Ongoing aggressive stroke risk factor management  Therapy recommendations:  pending  Disposition:  Pending  Hx of stroke  03/2020 admitted for aphasia and confusion.  CT and MRI showed left frontal parietal infarcts, chronic left frontal, bilateral occipital, left cerebellum infarcts.  MRA unremarkable.  Carotid Doppler negative.  TTE unremarkable.  LDL 94, A1c 6.8.  TEE no source of emboli.  Loop recorder not placed due to no insurance.  Discharged on DAPT and Lipitor 40.  Hx of stroke 5-7 years ago, no details  Seizure  ruled out  06/07/2020, episode of left arm twitching, altered mental status, decreased response and roving eyes.    CT 11/6 no acute abnormality.    EEG 11/7 negative for seizure  Currently LTM EEG no Sz so far  D/c LTM EEG  Hypertension  Home BP meds: amlodipine ; lisinopril  Current BP meds: amlodipine ; labetalol prn  Stable . Long-term BP goal normotensive  Hyperlipidemia  Home Lipid lowering medication: Lipitor 40 mg daily   LDL 94  (03/2020), goal < 70  Current lipid lowering medication: Lipitor 40 mg daily   Continue statin at discharge  Diabetes  Home diabetic meds: metformin  Current diabetic meds: SSI   HgbA1c 6.9, goal < 7.0  CBG monitoring  Other Stroke Risk Factors  Advanced age  Family hx stroke (brother and sister)   Other Active Problems  Code status - Full code  Psych consult   Hospital day # 10  I  discussed with Dr. Cheral Marker and Dr. Sloan Leiter. I spent  35 minutes in total face-to-face time with the patient, more than 50% of which was spent in counseling and coordination of care, reviewing test results, images and medication, and discussing the diagnosis, treatment plan and potential prognosis. This patient's care requiresreview of multiple databases, neurological assessment, discussion with family, other specialists and medical decision making of high complexity.  Rosalin Hawking, MD PhD Stroke Neurology 06/18/2020 7:24 PM  To contact Stroke Continuity provider, please refer to [email or web address] After hours, contact General Neurology

## 2020-06-18 NOTE — Progress Notes (Signed)
LTM EEG discontinued - no skin breakdown at unhook.   

## 2020-06-18 NOTE — Progress Notes (Signed)
Reapplied EEG maint. FP1 FP2 F7 F8 No  Skin breakdown

## 2020-06-19 LAB — COMPREHENSIVE METABOLIC PANEL
ALT: 20 U/L (ref 0–44)
AST: 35 U/L (ref 15–41)
Albumin: 3.4 g/dL — ABNORMAL LOW (ref 3.5–5.0)
Alkaline Phosphatase: 63 U/L (ref 38–126)
Anion gap: 12 (ref 5–15)
BUN: 13 mg/dL (ref 8–23)
CO2: 21 mmol/L — ABNORMAL LOW (ref 22–32)
Calcium: 9.7 mg/dL (ref 8.9–10.3)
Chloride: 105 mmol/L (ref 98–111)
Creatinine, Ser: 0.74 mg/dL (ref 0.44–1.00)
GFR, Estimated: >60 mL/min (ref >60)
Glucose, Bld: 205 mg/dL — ABNORMAL HIGH (ref 70–99)
Potassium: 3.8 mmol/L (ref 3.5–5.1)
Sodium: 138 mmol/L (ref 135–145)
Total Bilirubin: 0.7 mg/dL (ref 0.3–1.2)
Total Protein: 7.2 g/dL (ref 6.5–8.1)

## 2020-06-19 LAB — GLUCOSE, CAPILLARY
Glucose-Capillary: 232 mg/dL — ABNORMAL HIGH (ref 70–99)
Glucose-Capillary: 352 mg/dL — ABNORMAL HIGH (ref 70–99)
Glucose-Capillary: 244 mg/dL — ABNORMAL HIGH (ref 70–99)
Glucose-Capillary: 135 mg/dL — ABNORMAL HIGH (ref 70–99)

## 2020-06-19 LAB — CBC
HCT: 41.6 % (ref 36.0–46.0)
Hemoglobin: 14.1 g/dL (ref 12.0–15.0)
MCH: 30.3 pg (ref 26.0–34.0)
MCHC: 33.9 g/dL (ref 30.0–36.0)
MCV: 89.5 fL (ref 80.0–100.0)
Platelets: 304 10*3/uL (ref 150–400)
RBC: 4.65 MIL/uL (ref 3.87–5.11)
RDW: 13.8 % (ref 11.5–15.5)
WBC: 12.3 10*3/uL — ABNORMAL HIGH (ref 4.0–10.5)
nRBC: 0 % (ref 0.0–0.2)

## 2020-06-19 LAB — VDRL, CSF: VDRL Quant, CSF: NONREACTIVE

## 2020-06-19 MED ORDER — INSULIN GLARGINE 100 UNIT/ML ~~LOC~~ SOLN
10.0000 [IU] | Freq: Every day | SUBCUTANEOUS | Status: DC
  Administered 2020-06-19 – 2020-06-24 (×6): 10 [IU] via SUBCUTANEOUS
  Filled 2020-06-19 (×9): qty 0.1

## 2020-06-19 MED ORDER — SODIUM CHLORIDE 0.9 % IV SOLN: INTRAVENOUS | Status: DC

## 2020-06-19 MED ORDER — ASPIRIN 325 MG PO TABS
325.0000 mg | ORAL_TABLET | Freq: Every day | ORAL | Status: DC
  Administered 2020-06-20: 325 mg via ORAL
  Filled 2020-06-19: qty 1

## 2020-06-19 MED ORDER — INSULIN ASPART 100 UNIT/ML ~~LOC~~ SOLN
0.0000 [IU] | Freq: Three times a day (TID) | SUBCUTANEOUS | Status: DC
  Administered 2020-06-19: 15 [IU] via SUBCUTANEOUS

## 2020-06-19 MED ORDER — INSULIN ASPART 100 UNIT/ML ~~LOC~~ SOLN
0.0000 [IU] | Freq: Three times a day (TID) | SUBCUTANEOUS | Status: DC
  Administered 2020-06-19: 7 [IU] via SUBCUTANEOUS
  Administered 2020-06-20: 4 [IU] via SUBCUTANEOUS
  Administered 2020-06-20: 3 [IU] via SUBCUTANEOUS
  Administered 2020-06-21 (×2): 4 [IU] via SUBCUTANEOUS
  Administered 2020-06-22: 7 [IU] via SUBCUTANEOUS
  Administered 2020-06-22 – 2020-06-23 (×3): 4 [IU] via SUBCUTANEOUS
  Administered 2020-06-24: 7 [IU] via SUBCUTANEOUS
  Administered 2020-06-24: 0 [IU] via SUBCUTANEOUS
  Administered 2020-06-25: 7 [IU] via SUBCUTANEOUS

## 2020-06-19 MED ORDER — INSULIN ASPART 100 UNIT/ML ~~LOC~~ SOLN
0.0000 [IU] | Freq: Every day | SUBCUTANEOUS | Status: DC
  Administered 2020-06-20: 3 [IU] via SUBCUTANEOUS

## 2020-06-19 MED ORDER — ASPIRIN EC 325 MG PO TBEC: 325.0000 mg | DELAYED_RELEASE_TABLET | Freq: Every day | ORAL | Status: DC

## 2020-06-19 MED ORDER — MIDAZOLAM HCL 2 MG/2ML IJ SOLN: 1.0000 mg | INTRAMUSCULAR | Status: DC | PRN

## 2020-06-19 MED ORDER — ASPIRIN 325 MG PO TABS
325.0000 mg | ORAL_TABLET | Freq: Every day | ORAL | Status: DC
  Administered 2020-06-19: 325 mg via ORAL
  Filled 2020-06-19: qty 1

## 2020-06-19 MED ORDER — FENTANYL CITRATE (PF) 100 MCG/2ML IJ SOLN: 25.0000 ug | INTRAMUSCULAR | Status: DC | PRN

## 2020-06-19 NOTE — Progress Notes (Signed)
Inpatient Diabetes Program Recommendations  AACE/ADA: New Consensus Statement on Inpatient Glycemic Control (2015)  Target Ranges:  Prepandial:   less than 140 mg/dL      Peak postprandial:   less than 180 mg/dL (1-2 hours)      Critically ill patients:  140 - 180 mg/dL   Lab Results  Component Value Date   GLUCAP 352 (H) 06/19/2020   HGBA1C 6.9 (H) 06/09/2020    Review of Glycemic Control Results for Sonya George, Sonya George (MRN [id number]) as of 06/19/2020 12:01  Ref. Range 06/18/2020 21:53 06/19/2020 06:17 06/19/2020 11:37  Glucose-Capillary Latest Ref Range: 70 - 99 mg/dL 149 (H) 232 (H) 352 (H)     Inpatient Diabetes Program Recommendations:    CBG's elevated in the setting of steroids.  If steroids continue and post prandials remain elevated might consider, Novolog 5 units tid with meals if consumes at least 50% of meal.  Will continue to follow while inpatient.  Thank you, Reche Dixon, RN, BSN Diabetes Coordinator Inpatient Diabetes Program [phone] (team pager from 8a-5p)

## 2020-06-19 NOTE — Progress Notes (Signed)
Occupational Therapy Treatment Patient Details Name: Sonya George MRN: [id number] DOB: Apr 20, 1956 Today's Date: 06/19/2020    History of present illness 64 y.o. female admitted with AMS with difficulty communicating. 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 pt noncompliant spits out medications PMH CVA HTN DM seizures   OT comments  Pt making steady progress towards OT goals this session. Pts daughter present during session with pt more attentive and alert. Pt noted to verbalize "yes" when asked if she wanted to get OOB. Pt able to ambulate from EOB<>door with MOD- MAX A +2 HHA. Pt continues to present with decreased safety awareness, cogntive deficits, balance impairments and decreased activity tolerance impacting pts ability to complete BADLs. Pt continues to be impulsive  at times needing assist to redirect back to session.  Pt would continue to benefit from skilled occupational therapy while admitted and after d/c to address the below listed limitations in order to improve overall functional mobility and facilitate independence with BADL participation. DC plan remains appropriate, will follow acutely per POC.     Follow Up Recommendations  SNF;Other (comment) (family to d/c home with pt so HHOT)    Equipment Recommendations  3 in 1 bedside commode;Wheelchair (measurements OT);Wheelchair cushion (measurements OT)    Recommendations for Other Services      Precautions / Restrictions Precautions Precautions: Fall;Other (comment) Precaution Comments: tele safety sitter Restrictions Weight Bearing Restrictions: No       Mobility Bed Mobility Overal bed mobility: Needs Assistance Bed Mobility: Supine to Sit;Sit to Supine     Supine to sit: Mod assist;+2 for physical assistance Sit to supine: Max assist;+2 for physical assistance   General bed mobility comments: pt initiating transferring OOB needing MOD A +2 moslty to elevate trunk and scoot hips safely to EOB, MAX  A+2 to return pt to supine as pt resistant to movement  Transfers Overall transfer level: Needs assistance Equipment used: 2 person hand held assist Transfers: Sit to/from Stand Sit to Stand: Mod assist;+2 physical assistance         General transfer comment: pt stood from EOB with MOD A +2 needing most assist to maintain balance and come to full upright position initally    Balance Overall balance assessment: Needs assistance Sitting-balance support: Bilateral upper extremity supported;Feet supported Sitting balance-Leahy Scale: Poor Sitting balance - Comments: requires at least min guard d/t impulsivity   Standing balance support: Bilateral upper extremity supported Standing balance-Leahy Scale: Poor Standing balance comment: reliant on BUE support and external assist                           ADL either performed or assessed with clinical judgement   ADL Overall ADL's : Needs assistance/impaired     Grooming: Wash/dry face;Sitting;Supervision/safety;Set up Grooming Details (indicate cue type and reason): pt able to wipe face with increased time and tactile cues from sitting EOB                 Toilet Transfer: Moderate assistance;+2 for physical assistance;Maximal assistance;Ambulation Toilet Transfer Details (indicate cue type and reason): simulated via functional mobility with pt able to ambulate to door and back with up to MAX A +2 for balance with HHA         Functional mobility during ADLs: Maximal assistance;Moderate assistance;+2 for physical assistance;Cueing for safety General ADL Comments: pt able to ambulate from EOB<>door with MOD- MAX A +2 and HHA. pt continues to present with decreased  safety awareness, cogntive deficits, balance impairments and decreased activity tolerance     Vision       Perception     Praxis      Cognition Arousal/Alertness: Awake/alert Behavior During Therapy: Restless;Impulsive Overall Cognitive Status:  Difficult to assess                                 General Comments: pt more alert and attentive this session and did verbalize "yes" when asked if pt wanted to get OOB. pts daughter present speaking to her in her native language. pt following commands ~ 50% of session but continues to present with impulsivity and decreased safety awareness        Exercises     Shoulder Instructions       General Comments pts dtr present, VSS    Pertinent Vitals/ Pain       Pain Assessment: Faces Faces Pain Scale: No hurt  Home Living                                          Prior Functioning/Environment              Frequency  Min 2X/week        Progress Toward Goals  OT Goals(current goals can now be found in the care plan section)  Progress towards OT goals: Progressing toward goals  Acute Rehab OT Goals Patient Stated Goal: Pt's daughter desires for pt to improve functionally OT Goal Formulation: With family Time For Goal Achievement: 06/25/20 Potential to Achieve Goals: Good  Plan Discharge plan remains appropriate;Frequency remains appropriate    Co-evaluation                 AM-PAC OT "6 Clicks" Daily Activity     Outcome Measure   Help from another person eating meals?: A Lot Help from another person taking care of personal grooming?: A Lot Help from another person toileting, which includes using toliet, bedpan, or urinal?: Total Help from another person bathing (including washing, rinsing, drying)?: A Lot Help from another person to put on and taking off regular upper body clothing?: A Lot Help from another person to put on and taking off regular lower body clothing?: Total 6 Click Score: 10    End of Session Equipment Utilized During Treatment: Gait belt  OT Visit Diagnosis: Unsteadiness on feet (R26.81);Muscle weakness (generalized) (M62.81)   Activity Tolerance Patient tolerated treatment well   Patient Left in  bed;with call bell/phone within reach;with bed alarm set;with nursing/sitter in room   Nurse Communication Mobility status;Other (comment) (able to walk in room today, back in bed)        Time: 9450-3888 OT Time Calculation (min): 12 min  Charges: OT General Charges $OT Visit: 1 Visit OT Treatments $Therapeutic Activity: 8-22 mins  Lanier Clam., COTA/L Acute Rehabilitation Services [phone]    Ihor Gully 06/19/2020, 9:52 AM

## 2020-06-19 NOTE — Procedures (Signed)
Patient Name:[name] WER:[id number] Epilepsy Attending:Hasheem Voland Barbra Sarks Referring Physician/Provider:Dr. Rosalin Hawking Duration:11/17/20211113 to11/17/2021 1408  Patient history:64yo F withrapidly progressive dementia as well as subacute right parietal infarct.EEG to evaluate for seizure.  Level of alertness:Awake, asleep  AEDs during EEG study:None  Technical aspects: This EEG study was done with scalp electrodes positioned according to the 10-20 International system of electrode placement. Electrical activity was acquired at a sampling rate of 500Hz  and reviewed with a high frequency filter of 70Hz  and a low frequency filter of 1Hz . EEG data were recorded continuously and digitally stored.   Description: The posterior dominant rhythm consists of8Hz  activity of moderate voltage (25-35 uV) seen predominantly in posterior head regions, symmetric and reactive to eye opening and eye closing.Sleep was characterized by vertex waves, maximal frontocentral region. EEG showed continuous generalized 3 to 6 Hz theta-delta slowing.Hyperventilation and photic stimulation were not performed.  Of note, parts of the study were difficult to review due to significant electrode artifact.  ABNORMALITY -Continuousslow, generalized  IMPRESSION: Thistechnically difficultstudy is suggestive of mild diffuse encephalopathy, nonspecific etiology.No seizures ordefiniteepileptiform discharges were seen throughout the recording.   [name] Barbra Sarks

## 2020-06-19 NOTE — Progress Notes (Signed)
PROGRESS NOTE        PATIENT DETAILS Name: Sonya George Age: 64 y.o. Sex: female Date of Birth: [birth year] Admit Date: 06/07/2020 Admitting Physician Reubin Milan, MD UTM:[email or web address], Delfino Lovett, NP  Brief Narrative: Patient is a 64 y.o. female with past medical history of HTN, DM, prior CVA-who presented with altered mental status after getting news of her sister's passing.  She was admitted for further work-up-extensive neuroimaging/EEG negative-evaluated by both neurology and psychiatry-initially thought to have functional/stress-induced etiology for altered mental status-however due to continued speech abnormalities-rapidly fluctuating/progressive mental status changes-neurology reconsulted-LP performed-further work-up underway-see below.  Significant events: 8/28-8/31>> admit to [facility] for work-up of CVA 11/6>> admit to [facility] for confusion 11/17>> urinary retention-Foley inserted 11/17>> IV steroids 1 g daily x5 days started.  Significant studies: 11/6>> CT head: No acute intracranial abnormality. 11/6>> chest x-ray: No active pulmonary disease. 11/6>> HIV nonreactive 11/7>> vitamin B12 and vitamin B1: Within normal limits 11/7>> RPR: Nonreactive 11/7>> TSH: Within normal limits 11/8>> A1c 6.9 11/7>> vitamin B12: 561 11/13>>MRI Brain:Subacute right parietal infarct. 11/15>> CT chest/abdomen/pelvis: Large stool burden, distended bladder-no acute abnormality in the chest, abdomen or pelvis.  Antimicrobial therapy: None  Microbiology data: 11/16>> CSF culture: Negative so far 11/16>> VDRL CSF, HSV PCR CHF, autoimmune panel, oligoclonal bands: Pending  Procedures : 11/7>>spot EEG: Mild diffuse encephalopathy-nonspecific etiology-no seizures. 11/15-11/16>> LTM EEG: No seizures 11/16>> LP by neurology  Consults: Neurology, psychiatry  DVT Prophylaxis : Lovenox   Subjective: Appears a bit more alert-able to shake my hands-following some  commands.  Assessment/Plan: Acute encephalopathy: Initially thought to be functional-stress response to patient's sister passing-however she continues to have significant speech abnormalities that is much different than her usual baseline.  Her mental status continues to fluctuate-suspect she at times has delirium-at times has expressive aphasia.  Per daughter-her current mental status (ongoing for approximately 2-3 weeks) is significantly worse than her usual baseline after her most recent CVA (able to converse in a language-and relatively independent with walking).  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  Neurology recommending repeat MRI/MRA brain- will need to be done under anesthesia-have ordered accordingly.  Subacute CVA-recent hospitalization for CVA: Unlikely causing her confusion/AMS-continue aspirin/statin.  Had an extensive stroke work-up during her most recent hospitalization.  Acute urinary retention: Occurred on 11/17 with more than 700 cc of urine-Foley inserted.  Will attempt voiding trial in the next 2-3 days.  HLD: Continue statin  HTN: BP stable-continue amlodipine-lisinopril remains on hold.   DM-2 (A1c 6.9 on 11/8) with uncontrolled hyperglycemia due to steroids: CBGs on the higher side today-due to steroids-change SSI to resistant scale-add 10 units of Lantus-follow and adjust.    Recent Labs    06/18/20 1656 06/18/20 2153 06/19/20 0617  GLUCAP 124* 149* 232*    Diet: Diet Order            Diet heart healthy/carb modified Room service appropriate? Yes; Fluid consistency: Thin  Diet effective now                  Code Status: Full code   Family Communication: Daughter [name] [phone]) at bedside on 11/18   Disposition Plan: Status is:  Inpatient  Remains inpatient appropriate because:Inpatient level of care appropriate due to  severity of illness   Dispo: The patient is from: Home              Anticipated d/c is to: TBD              Anticipated d/c date is: 2 days              Patient currently is not medically stable to d/c.   Barriers to Discharge: Continued altered mental status-not yet at baseline-LTM EEG in progress-LP planned.  Antimicrobial agents: Anti-infectives (From admission, onward)   None       Time spent: 25- minutes-Greater than 50% of this time was spent in counseling, explanation of diagnosis, planning of further management, and coordination of care.  MEDICATIONS: Scheduled Meds: . amLODipine  10 mg Oral Daily  . aspirin EC  325 mg Oral Daily  . atorvastatin  40 mg Oral q1800  . Chlorhexidine Gluconate Cloth  6 each Topical Daily  . enoxaparin (LOVENOX) injection  40 mg Subcutaneous Q24H  . insulin aspart  0-15 Units Subcutaneous TID WC  . insulin aspart  0-5 Units Subcutaneous QHS  . multivitamin with minerals  1 tablet Oral Daily  . polyethylene glycol  17 g Oral Daily  . QUEtiapine  50 mg Oral QHS   Continuous Infusions: . sodium chloride Stopped (06/19/20 0101)  . methylPREDNISolone (SOLU-MEDROL) injection 1,000 mg (06/18/20 2328)   PRN Meds:.sodium chloride, acetaminophen **OR** acetaminophen, bisacodyl, diphenhydrAMINE, haloperidol lactate, labetalol, ondansetron **OR** ondansetron (ZOFRAN) IV, sodium phosphate   PHYSICAL EXAM: Vital signs: Vitals:   06/18/20 1648 06/18/20 2111 06/18/20 2334 06/19/20 0331  BP: 123/71 131/78 119/76 127/66  Pulse: 98 96 100 (!) 101  Resp: 18 19 20 18   Temp: 98.4 F (36.9 C) (!) 97.5 F (36.4 C) 98.3 F (36.8 C) 98.4 F (36.9 C)  TempSrc: Oral Oral Oral Oral  SpO2: 99% 99% 99% 96%  Weight:      Height:       Filed Weights   06/07/20 0840  Weight: 63.5 kg   Body mass index is 22.6 kg/m.   Gen Exam: Alert-but not in any  distress. HEENT:atraumatic, normocephalic Chest: B/L clear to auscultation anteriorly CVS:S1S2 regular Abdomen:soft non tender, non distended Extremities:no edema Neurology: Non focal Skin: no rash  I have personally reviewed following labs and imaging studies  LABORATORY DATA: CBC: Recent Labs  Lab 06/14/20 0420 06/19/20 0322  WBC 8.4 12.3*  HGB 13.7 14.1  HCT 41.3 41.6  MCV 89.4 89.5  PLT 259 269    Basic Metabolic Panel: Recent Labs  Lab 06/14/20 0420 06/19/20 0322  NA 140 138  K 3.5 3.8  CL 104 105  CO2 25 21*  GLUCOSE 154* 205*  BUN 14 13  CREATININE 0.79 0.74  CALCIUM 9.7 9.7    GFR: Estimated Creatinine Clearance: 65.6 mL/min (by C-G formula based on SCr of 0.74 mg/dL).  Liver Function Tests: Recent Labs  Lab 06/14/20 0420 06/19/20 0322  AST 27 35  ALT 20 20  ALKPHOS 59 63  BILITOT 0.9 0.7  PROT 6.5 7.2  ALBUMIN 3.2* 3.4*   No results for input(s): LIPASE, AMYLASE in the last 168 hours. Recent Labs  Lab 06/14/20 0420  AMMONIA 15    Coagulation Profile: No results for input(s): INR, PROTIME in the last 168 hours.  Cardiac Enzymes: No results for input(s): CKTOTAL, CKMB, CKMBINDEX, TROPONINI in the last 168 hours.  BNP (last 3 results) No results for input(s): PROBNP in the last 8760  hours.  Lipid Profile: No results for input(s): CHOL, HDL, LDLCALC, TRIG, CHOLHDL, LDLDIRECT in the last 72 hours.  Thyroid Function Tests: Recent Labs    06/16/20 1305  FREET4 1.05    Anemia Panel: No results for input(s): VITAMINB12, FOLATE, FERRITIN, TIBC, IRON, RETICCTPCT in the last 72 hours.  Urine analysis:    Component Value Date/Time   COLORURINE STRAW (A) 06/08/2020 1349   APPEARANCEUR CLEAR 06/08/2020 1349   LABSPEC 1.015 06/08/2020 1349   PHURINE 8.0 06/08/2020 1349   GLUCOSEU 100 (A) 06/08/2020 1349   HGBUR NEGATIVE 06/08/2020 1349   BILIRUBINUR NEGATIVE 06/08/2020 1349   KETONESUR 15 (A) 06/08/2020 1349   PROTEINUR NEGATIVE  06/08/2020 1349   NITRITE NEGATIVE 06/08/2020 1349   LEUKOCYTESUR NEGATIVE 06/08/2020 1349    Sepsis Labs: Lactic Acid, Venous No results found for: LATICACIDVEN  MICROBIOLOGY: Recent Results (from the past 240 hour(s))  CSF culture     Status: None (Preliminary result)   Collection Time: 06/17/20  7:08 PM   Specimen: CSF  Result Value Ref Range Status   Specimen Description CSF  Final   Special Requests NONE  Final   Gram Stain NO WBC SEEN NO ORGANISMS SEEN   Final   Culture   Final    NO GROWTH < 12 HOURS Performed at [facility], [place] [address] [place], [place] [address]    Report Status PENDING  Incomplete    RADIOLOGY STUDIES/RESULTS: Overnight EEG with video  Result Date: 06/17/2020 Lora Havens, MD     06/18/2020  8:27 AM Patient Name: Conleigh Heinlein MRN: [id number] Epilepsy Attending: Lora Havens Referring Physician/Provider:  Dr. Rosalin Hawking Duration:  06/16/2020 1113 to 06/17/2020 1113  Patient history: 64yo F with rapidly progressive dementia as well as subacute right parietal infarct. EEG to evaluate for seizure.  Level of alertness: Awake, asleep  AEDs during EEG study: None  Technical aspects: This EEG study was done with scalp electrodes positioned according to the 10-20 International system of electrode placement. Electrical activity was acquired at a sampling rate of 500Hz  and reviewed with a high frequency filter of 70Hz  and a low frequency filter of 1Hz . EEG data were recorded continuously and digitally stored.  Description: The posterior dominant rhythm consists of 8 Hz activity of moderate voltage (25-35 uV) seen predominantly in posterior head regions, symmetric and reactive to eye opening and eye closing.  Sleep was characterized by vertex waves, maximal frontocentral region.  EEG showed continuous generalized 3 to 6 Hz theta-delta slowing.  Sharp transients were also noted in right temporal region. Hyperventilation and photic stimulation  were not performed.   Of note, parts of the study were difficult to review due to significant electrode artifact.  ABNORMALITY -Continuous slow, generalized  IMPRESSION: This technically difficult study is suggestive of mild diffuse encephalopathy, nonspecific etiology. No seizures or definite epileptiform discharges were seen throughout the recording.   Lora Havens     LOS: 11 days   Oren Binet, MD  [facility]    To contact the attending provider between 7A-7P or the covering provider during after hours 7P-7A, please log into the web site [email or web address] and access using universal [place] password for that web site. If you do not have the password, please call the hospital operator.  06/19/2020, 7:20 AM

## 2020-06-19 NOTE — Progress Notes (Signed)
Pt. is scheduled for MRI under general anesthesia tomorrow morning at 0800. Pt. to be NPO after midnight 06/20/2020.

## 2020-06-19 NOTE — Progress Notes (Signed)
STROKE TEAM PROGRESS NOTE   INTERVAL HISTORY Daughter at bedside.  Patient still global aphasia, not follow commands.  However, seems more awake alert than yesterday, slightly more interactive than yesterday.  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  Started empiric high-dose steroids.  OBJECTIVE Vitals:   06/18/20 2334 06/19/20 0331 06/19/20 0742 06/19/20 1139  BP: 119/76 127/66 105/87 114/65  Pulse: 100 (!) 101 (!) 110 98  Resp: 20 18 18 18   Temp: 98.3 F (36.8 C) 98.4 F (36.9 C) 98 F (36.7 C) 97.6 F (36.4 C)  TempSrc: Oral Oral Oral Axillary  SpO2: 99% 96% 96% 98%  Weight:      Height:        CBC:  Recent Labs  Lab 06/14/20 0420 06/19/20 0322  WBC 8.4 12.3*  HGB 13.7 14.1  HCT 41.3 41.6  MCV 89.4 89.5  PLT 259 284    Basic Metabolic Panel:  Recent Labs  Lab 06/14/20 0420 06/19/20 0322  NA 140 138  K 3.5 3.8  CL 104 105  CO2 25 21*  GLUCOSE 154* 205*  BUN 14 13  CREATININE 0.79 0.74  CALCIUM 9.7 9.7    Lipid Panel:     Component Value Date/Time   CHOL 149 03/30/2020 0531   TRIG 82 03/30/2020 0531   HDL 38 (L) 03/30/2020 0531   CHOLHDL 3.9 03/30/2020 0531   VLDL 16 03/30/2020 0531   LDLCALC 95 03/30/2020 0531   HgbA1c:  Lab Results  Component Value Date   HGBA1C 6.9 (H) 06/09/2020   Urine Drug Screen:     Component Value Date/Time   LABOPIA NONE DETECTED 06/08/2020 1349   COCAINSCRNUR NONE DETECTED 06/08/2020 1349   LABBENZ POSITIVE (A) 06/08/2020 1349   AMPHETMU NONE DETECTED 06/08/2020 1349   THCU NONE DETECTED 06/08/2020 1349   LABBARB NONE DETECTED 06/08/2020 1349    Alcohol Level No results found for: [place]  MR BRAIN WO CONTRAST 06/14/2020 IMPRESSION:  1. Motion degraded, incomplete examination.  2. Subacute right parietal infarct.  3. No definite acute infarct identified.  4. Multiple chronic infarcts as above.   TEE 04/01/2020 IMPRESSIONS  1. Left ventricular ejection fraction, by  estimation, is 60 to 65%. The  left ventricle has normal function. The left ventricle has no regional  wall motion abnormalities.  2. Right ventricular systolic function is normal. The right ventricular  size is normal.  3. Left atrial size was moderately dilated. No left atrial/left atrial  appendage thrombus was detected.  4. The mitral valve is normal in structure. Mild mitral valve  regurgitation. No evidence of mitral stenosis.  5. Tricuspid valve regurgitation is mild to moderate.  6. The aortic valve is tricuspid. Aortic valve regurgitation is moderate.  Mild to moderate aortic valve sclerosis/calcification is present, without  any evidence of aortic stenosis.  7. The inferior vena cava is normal in size with greater than 50%  respiratory variability, suggesting right atrial pressure of 3 mmHg.  8. Agitated saline contrast bubble study was negative, with no evidence  of any interatrial shunt.   ECG - SR rate 84 BPM. (See cardiology reading for complete details)  PHYSICAL EXAM  Temp:  [97.5 F (36.4 C)-98.4 F (36.9 C)] 97.6 F (36.4 C) (11/18 1139) Pulse Rate:  [96-110] 98 (11/18 1139) Resp:  [18-20] 18 (11/18 1139) BP: (105-131)/(65-87) 114/65 (11/18 1139) SpO2:  [96 %-99 %] 98 % (11/18 1139)  General - Well nourished, well developed, restless, altered.  Ophthalmologic - fundi not visualized due to noncooperation.  Cardiovascular - Regular rhythm and rate.  Neuro - restless, intermittently combative, global aphasia, very limited words output, able to say "yes, yes, yes", not following commands.  Slightly more interactive than yesterday.  Eyes midposition, not tracking but seems to have left gaze preference, not blinking to visual threat bilaterally. No facial asymmetry, moving all extremities strong equally. Sensation, coordination and gait not tested. Positive grip reflex.   ASSESSMENT/PLAN Sonya George is a 64 y.o. female with history of HTN, HLD, DM,  recent stroke with complete workup 03/2020 (TEE 04/01/20) returns with imaging showing new subacute stroke. She did not receive IV t-PA due to recent stroke.  Rapid progressive dementia  AMS with behavior change, gradually worsening for the last 3 weeks. 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  MRI new subacute right parietal infarct since 03/2020. 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 Multiple other chronic infarct including left PCA, b/l MCA/PCA and left cerebellar. 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  MRI and MRA with and without contrast  with sedation - pending  LTM EEG no Sz  EEG 11/7 diffuse encephalopathy   B1, B12, ammonia, TSH, FT4, HIV, RPR all neg  Thyroid antibodies neg  Pan CT no malignancy  LP WBC 0-1, protein 69 and glucose 99  Paraneoplastic panel pending  14-3-3 and RT QuIC pending  on empiric treatment with solumedrol 1g x 5 days  Stroke: Subacute right parietal infarct - embolic - source unknown.  MRI head - Subacute right parietal infarct. No definite acute infarct identified. Multiple chronic infarcts as above.   MRI and MRA with sedation - pending  TEE - 04/01/20 - EF 60 - 65%. No cardiac source of emboli identified.   [employer] Virus 2 - negative  HgbA1c - 6.9  UDS - benzodiazepine  VTE prophylaxis - Lovenox  aspirin 81 mg daily and clopidogrel 75 mg daily prior to admission, now on aspirin 325 mg daily. No DAPT in case of further procedure.  Ongoing aggressive stroke risk factor management  Therapy recommendations:  pending  Disposition:  Pending  Hx of stroke  03/2020 admitted for aphasia and confusion.  CT and MRI showed left frontal parietal infarcts, chronic left frontal, bilateral occipital, left cerebellum infarcts.  MRA unremarkable.  Carotid Doppler negative.  TTE unremarkable.  LDL 94, A1c 6.8.  TEE no source  of emboli.  Loop recorder not placed due to no insurance.  Discharged on DAPT and Lipitor 40. Per note, pt on discharge, still had "Receptive and expressive aphasia.Marland KitchenMarland KitchenNot following verbal commands... Nonverbal making unrecognizable sounds and grunts."  Hx of stroke 5-7 years ago, no details  Seizure ruled out  06/07/2020, episode of left arm twitching, altered mental status, decreased response and roving eyes.    CT 11/6 no acute abnormality.    EEG 11/7 negative for seizure  Currently LTM EEG no Sz so far  D/c LTM EEG  Hypertension  Home BP meds: amlodipine ; lisinopril  Current BP meds: amlodipine  Stable . Long-term BP goal normotensive  Hyperlipidemia  Home Lipid lowering medication: Lipitor 40 mg daily   LDL 94  (03/2020), goal < 70  Current lipid lowering medication: Lipitor 40 mg daily   Continue statin at discharge  Diabetes  Home diabetic meds: metformin  Current diabetic meds: SSI   HgbA1c 6.9, goal <  7.0  CBG monitoring  Other Stroke Risk Factors  Advanced age  Family hx stroke (brother and sister)   Other Active Problems  Code status - Full code  Psych consult   Hospital day # 75   Rosalin Hawking, MD PhD Stroke Neurology 06/19/2020 1:41 PM  To contact Stroke Continuity provider, please refer to [email or web address] After hours, contact General Neurology

## 2020-06-20 ENCOUNTER — Encounter (HOSPITAL_COMMUNITY)
Admission: EM | Disposition: A | Discharge status: Home / Self Care | Payer: Self-pay | Source: Home / Self Care | Attending: Internal Medicine

## 2020-06-20 ENCOUNTER — Inpatient Hospital Stay (HOSPITAL_COMMUNITY): Payer: Medicaid Other

## 2020-06-20 ENCOUNTER — Inpatient Hospital Stay (HOSPITAL_COMMUNITY): Payer: Medicaid Other | Admitting: Anesthesiology

## 2020-06-20 ENCOUNTER — Encounter (HOSPITAL_COMMUNITY): Payer: Self-pay | Admitting: Internal Medicine

## 2020-06-20 DIAGNOSIS — R451 Restlessness and agitation: Secondary | ICD-10-CM

## 2020-06-20 DIAGNOSIS — E1159 Type 2 diabetes mellitus with other circulatory complications: Secondary | ICD-10-CM

## 2020-06-20 HISTORY — PX: RADIOLOGY WITH ANESTHESIA: SHX6223

## 2020-06-20 LAB — GLUCOSE, CAPILLARY
Glucose-Capillary: 191 mg/dL — ABNORMAL HIGH (ref 70–99)
Glucose-Capillary: 165 mg/dL — ABNORMAL HIGH (ref 70–99)
Glucose-Capillary: 147 mg/dL — ABNORMAL HIGH (ref 70–99)
Glucose-Capillary: 288 mg/dL — ABNORMAL HIGH (ref 70–99)

## 2020-06-20 LAB — OLIGOCLONAL BANDS, CSF + SERM

## 2020-06-20 SURGERY — MRI WITH ANESTHESIA
Anesthesia: General

## 2020-06-20 MED ORDER — OXYCODONE HCL 5 MG/5ML PO SOLN: 5.0000 mg | Freq: Once | ORAL | Status: DC | PRN

## 2020-06-20 MED ORDER — FENTANYL CITRATE (PF) 100 MCG/2ML IJ SOLN: 25.0000 ug | INTRAMUSCULAR | Status: DC | PRN

## 2020-06-20 MED ORDER — ACETAMINOPHEN 500 MG PO TABS: 1000.0000 mg | ORAL_TABLET | Freq: Once | ORAL | Status: DC | PRN

## 2020-06-20 MED ORDER — LACTATED RINGERS IV SOLN: INTRAVENOUS | Status: DC | PRN

## 2020-06-20 MED ORDER — PHENYLEPHRINE HCL-NACL 10-0.9 MG/250ML-% IV SOLN
INTRAVENOUS | Status: DC | PRN
  Administered 2020-06-20: 20 ug/min via INTRAVENOUS

## 2020-06-20 MED ORDER — LIDOCAINE 2% (20 MG/ML) 5 ML SYRINGE
INTRAMUSCULAR | Status: DC | PRN
  Administered 2020-06-20: 40 mg via INTRAVENOUS

## 2020-06-20 MED ORDER — OXYCODONE HCL 5 MG PO TABS: 5.0000 mg | ORAL_TABLET | Freq: Once | ORAL | Status: DC | PRN

## 2020-06-20 MED ORDER — GADOBUTROL 1 MMOL/ML IV SOLN
6.5000 mL | Freq: Once | INTRAVENOUS | Status: AC | PRN
  Administered 2020-06-20: 6.5 mL via INTRAVENOUS

## 2020-06-20 MED ORDER — ACETAMINOPHEN 160 MG/5ML PO SOLN: 1000.0000 mg | Freq: Once | ORAL | Status: DC | PRN

## 2020-06-20 MED ORDER — ACETAMINOPHEN 10 MG/ML IV SOLN: 1000.0000 mg | Freq: Once | INTRAVENOUS | Status: DC | PRN

## 2020-06-20 MED ORDER — PROPOFOL 10 MG/ML IV BOLUS
INTRAVENOUS | Status: DC | PRN
  Administered 2020-06-20: 50 mg via INTRAVENOUS
  Administered 2020-06-20: 100 mg via INTRAVENOUS
  Administered 2020-06-20: 50 mg via INTRAVENOUS

## 2020-06-20 NOTE — Anesthesia Preprocedure Evaluation (Signed)
Anesthesia Evaluation  Patient identified by MRN, date of birth, ID band Patient confused    Reviewed: Allergy & Precautions, NPO status , Patient's Chart, lab work & pertinent test results, reviewed documented beta blocker date and time , Unable to perform ROS - Chart review only  History of Anesthesia Complications Negative for: history of anesthetic complications  Airway Mallampati: III  TM Distance: >3 FB Neck ROM: Full    Dental  (+) Dental Advisory Given   Pulmonary    breath sounds clear to auscultation       Cardiovascular hypertension, Pt. on medications  Rhythm:Regular  1. Left ventricular ejection fraction, by estimation, is 60 to 65%. The  left ventricle has normal function. The left ventricle has no regional  wall motion abnormalities.  2. Right ventricular systolic function is normal. The right ventricular  size is normal.  3. Left atrial size was moderately dilated. No left atrial/left atrial  appendage thrombus was detected.  4. The mitral valve is normal in structure. Mild mitral valve  regurgitation. No evidence of mitral stenosis.  5. Tricuspid valve regurgitation is mild to moderate.  6. The aortic valve is tricuspid. Aortic valve regurgitation is moderate.  Mild to moderate aortic valve sclerosis/calcification is present, without  any evidence of aortic stenosis.  7. The inferior vena cava is normal in size with greater than 50%  respiratory variability, suggesting right atrial pressure of 3 mmHg.  8. Agitated saline contrast bubble study was negative, with no evidence  of any interatrial shunt.    Neuro/Psych Workup for stroke like and seizure like activity, global aphasia  Hx of stroke  03/2020 admitted for aphasia and confusion.  CT and MRI showed left frontal parietal infarcts, chronic left frontal, bilateral occipital, left cerebellum infarcts.  MRA unremarkable.  Carotid Doppler negative.   TTE unremarkable.  LDL 94, A1c 6.8.  TEE no source of emboli.  Loop recorder not placed due to no insurance.  Discharged on DAPT and Lipitor 40. Per note, pt on discharge, still had "Receptive and expressive aphasia.Marland KitchenMarland KitchenNot following verbal commands... Nonverbal making unrecognizable sounds and grunts."  Hx of stroke 5-7 years ago, no details  Seizure ruled out  06/07/2020, episode of left arm twitching, altered mental status, decreased response and roving eyes.    CT 11/6 no acute abnormality.    EEG 11/7 negative for seizure  Currently LTM EEG no Sz so far  D/c LTM EEG CVA negative psych ROS   GI/Hepatic negative GI ROS, Neg liver ROS,   Endo/Other  diabetes, Oral Hypoglycemic Agents  Renal/GU Lab Results      Component                Value               Date                      CREATININE               0.74                06/19/2020                Musculoskeletal   Abdominal   Peds  Hematology negative hematology ROS (+) Lab Results      Component                Value               Date  WBC                      12.3 (H)            06/19/2020                HGB                      14.1                06/19/2020                HCT                      41.6                06/19/2020                MCV                      89.5                06/19/2020                PLT                      304                 06/19/2020              Anesthesia Other Findings   Reproductive/Obstetrics                             Anesthesia Physical Anesthesia Plan  ASA: IV  Anesthesia Plan: General   Post-op Pain Management:    Induction: Intravenous  PONV Risk Score and Plan: 3 and Ondansetron and Dexamethasone  Airway Management Planned: LMA  Additional Equipment: None  Intra-op Plan:   Post-operative Plan: Extubation in OR  Informed Consent: I have reviewed the patients History and Physical, chart, labs and discussed  the procedure including the risks, benefits and alternatives for the proposed anesthesia with the patient or authorized representative who has indicated his/her understanding and acceptance.     Dental advisory given  Plan Discussed with: CRNA  Anesthesia Plan Comments:         Anesthesia Quick Evaluation

## 2020-06-20 NOTE — Transfer of Care (Signed)
Immediate Anesthesia Transfer of Care Note  Patient: Sonya George  Procedure(s) Performed: MRI WITH ANESTHESIA (N/A )  Patient Location: PACU  Anesthesia Type:General  Level of Consciousness: awake and patient cooperative  Airway & Oxygen Therapy: Patient Spontanous Breathing  Post-op Assessment: Report given to RN and Post -op Vital signs reviewed and stable  Post vital signs: Reviewed and stable  Last Vitals:  Vitals Value Taken Time  BP 114/75 06/20/20 1040  Temp 36.2 C 06/20/20 1040  Pulse 102 06/20/20 1044  Resp 22 06/20/20 1044  SpO2 97 % 06/20/20 1044  Vitals shown include unvalidated device data.  Last Pain:  Vitals:   06/20/20 0344  TempSrc: Oral  PainSc:          Complications: No complications documented.

## 2020-06-20 NOTE — Progress Notes (Signed)
Physical Therapy Treatment Patient Details Name: Sonya George MRN: [id number] DOB: May 04, 1956 Today's Date: 06/20/2020    History of Present Illness 64 y.o. female admitted with AMS with difficulty communicating. 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 pt noncompliant spits out medications PMH CVA HTN DM seizures    PT Comments    The pt is showing slow progress towards her goals as she has been more consistent these past couple sessions with her ability to come to stand. However, her poor cognition, language barrier, and poor ability to follow cues limits her progress as she can be resistive with all functional mobility resulting in her requiring extensive assistance for all. She required maxAx2 with assistance from rehab tech to pivot from long sitting position to sitting EOB and then to come to stand or stand pivot to the R. She would extend her knees or push posteriorly or laterally opposite of desired direction with all tasks, impacting her safety. Will continue to follow acutely. Current recommendations remain appropriate.   Follow Up Recommendations  SNF;Supervision/Assistance - 24 hour     Equipment Recommendations  Wheelchair (measurements PT);Wheelchair cushion (measurements PT);Hospital bed;3in1 (PT);Rolling walker with 5" wheels    Recommendations for Other Services       Precautions / Restrictions Precautions Precautions: Fall;Other (comment) Precaution Comments: tele safety sitter; mitts Restrictions Weight Bearing Restrictions: No    Mobility  Bed Mobility Overal bed mobility: Needs Assistance Bed Mobility: Supine to Sit     Supine to sit: +2 for physical assistance;Max assist     General bed mobility comments: Pt sitting up in long-sit position in bed upon arrival. Attempted to provide verbal and tactile cues to bring legs towards EOB, but pt not initiating. Required maxAx2 to pivot pt's body to sit and square up EOB.  Transfers Overall transfer  level: Needs assistance Equipment used: 2 person hand held assist;Rolling walker (2 wheeled) Transfers: Sit to/from Stand Sit to Stand: Max assist;+2 physical assistance Stand pivot transfers: Max assist;+2 physical assistance       General transfer comment: Sit to stand from EOB with pt holding legs stiffly with knees extended and feet off the floor. Provided physical assistance to flex knees and place feet along with foot block as she would still lift her feet off the floor. MaxAx2 to initiate and complete transfer to stand, with pt pushing strongly posteriorly and laterally to the R the first bout with use of RW. Pt held and picked RW off floor though. During second transfer with stand pivot to R EOB to recliner pt pushed laterally to the L, resulting in maxAx2 to complete transfer with 2 HHA.  Ambulation/Gait             General Gait Details: Deferred due to safety concerns as she required maxAx2 to stand statically due to strong resistance   Stairs             Wheelchair Mobility    Modified Rankin (Stroke Patients Only)       Balance Overall balance assessment: Needs assistance Sitting-balance support: Bilateral upper extremity supported;Feet supported Sitting balance-Leahy Scale: Poor Sitting balance - Comments: requires at least min guard d/t impulsivity Postural control: Posterior lean Standing balance support: Bilateral upper extremity supported Standing balance-Leahy Scale: Poor Standing balance comment: MaxAx2 to maintain static standing balance with bilat UE support due to pt's strong resistance with posterior push.  Cognition Arousal/Alertness: Awake/alert Behavior During Therapy: Restless;Impulsive Overall Cognitive Status: Difficult to assess Area of Impairment: Attention;Following commands;Safety/judgement;Awareness                   Current Attention Level: Alternating   Following Commands: Follows  one step commands inconsistently;Follows one step commands with increased time Safety/Judgement: Decreased awareness of safety;Decreased awareness of deficits Awareness: Intellectual   General Comments: Pt awake and sitting up in bed upon arrival. Pt did not follow any verbal or tactile cues and thus required physical assistance to initiate all functional mobility to get pt to participate and assist. Pt continuously grabbing at various objects and therapist.      Exercises      General Comments        Pertinent Vitals/Pain Pain Assessment: Faces Faces Pain Scale: No hurt Pain Intervention(s): Limited activity within patient's tolerance;Monitored during session;Repositioned    Home Living                      Prior Function            PT Goals (current goals can now be found in the care plan section) Acute Rehab PT Goals Patient Stated Goal: Unable to state PT Goal Formulation: Patient unable to participate in goal setting Time For Goal Achievement: 06/23/20 Potential to Achieve Goals: Fair Progress towards PT goals: Progressing toward goals    Frequency    Min 3X/week      PT Plan Current plan remains appropriate    Co-evaluation              AM-PAC PT "6 Clicks" Mobility   Outcome Measure  Help needed turning from your back to your side while in a flat bed without using bedrails?: A Lot Help needed moving from lying on your back to sitting on the side of a flat bed without using bedrails?: A Lot Help needed moving to and from a bed to a chair (including a wheelchair)?: A Lot Help needed standing up from a chair using your arms (e.g., wheelchair or bedside chair)?: A Lot Help needed to walk in hospital room?: A Lot Help needed climbing 3-5 steps with a railing? : Total 6 Click Score: 11    End of Session Equipment Utilized During Treatment: Gait belt Activity Tolerance: Patient tolerated treatment well Patient left: in chair;with call  bell/phone within reach;with chair alarm set;Other (comment) (mitts donned) Nurse Communication: Mobility status;Precautions;Other (comment) (mitts donned ) PT Visit Diagnosis: Muscle weakness (generalized) (M62.81);Difficulty in walking, not elsewhere classified (R26.2);Other abnormalities of gait and mobility (R26.89);Unsteadiness on feet (R26.81)     Time: 1430-1450 PT Time Calculation (min) (ACUTE ONLY): 20 min  Charges:  $Therapeutic Activity: 8-22 mins                     Moishe Spice, PT, DPT Acute Rehabilitation Services  Pager: [phone] Office: [place] 06/20/2020, 3:07 PM

## 2020-06-20 NOTE — Anesthesia Procedure Notes (Signed)
Procedure Name: LMA Insertion Date/Time: 06/20/2020 9:30 AM Performed by: Renato Shin, CRNA Pre-anesthesia Checklist: Patient identified, Emergency Drugs available, Suction available and Patient being monitored Patient Re-evaluated:Patient Re-evaluated prior to induction Oxygen Delivery Method: Circle system utilized Preoxygenation: Pre-oxygenation with 100% oxygen Induction Type: IV induction Ventilation: Mask ventilation without difficulty LMA: LMA inserted LMA Size: 4.0 Number of attempts: 2 Airway Equipment and Method: Oral airway Placement Confirmation: positive ETCO2 and breath sounds checked- equal and bilateral Tube secured with: Tape Dental Injury: Teeth and Oropharynx as per pre-operative assessment  Comments: Pt noted to have loose lower teeth after induction. Pt language barrier and altered mental status prevent full preop assessment of dentition. Dr Ermalene Postin aware. Dentition unchanged from preop assessemnt after LMA placed.

## 2020-06-20 NOTE — Progress Notes (Addendum)
STROKE TEAM PROGRESS NOTE   INTERVAL HISTORY No family at bedside. Seems slight more awake alert, interactive however, still largely global aphasia, not following command, pleasantly demented. Pending CSF paraneoplastic panel and CJD. On solumedrol day # 3. Would like Dr. Leonie Man to see on Monday to compare with her condition in 03/2020.   OBJECTIVE Vitals:   06/20/20 0753 06/20/20 1040 06/20/20 1055 06/20/20 1117  BP:  114/75 131/72 122/70  Pulse:  (!) 103 (!) 102 (!) 101  Resp:  18 (!) 21 18  Temp:  (!) 97.1 F (36.2 C) 97.6 F (36.4 C) 97.8 F (36.6 C)  TempSrc:    Axillary  SpO2:  97% 98%   Weight: 63.5 kg     Height: 5\' 6"  (1.676 m)       CBC:  Recent Labs  Lab 06/14/20 0420 06/19/20 0322  WBC 8.4 12.3*  HGB 13.7 14.1  HCT 41.3 41.6  MCV 89.4 89.5  PLT 259 431    Basic Metabolic Panel:  Recent Labs  Lab 06/14/20 0420 06/19/20 0322  NA 140 138  K 3.5 3.8  CL 104 105  CO2 25 21*  GLUCOSE 154* 205*  BUN 14 13  CREATININE 0.79 0.74  CALCIUM 9.7 9.7    Lipid Panel:     Component Value Date/Time   CHOL 149 03/30/2020 0531   TRIG 82 03/30/2020 0531   HDL 38 (L) 03/30/2020 0531   CHOLHDL 3.9 03/30/2020 0531   VLDL 16 03/30/2020 0531   LDLCALC 95 03/30/2020 0531   HgbA1c:  Lab Results  Component Value Date   HGBA1C 6.9 (H) 06/09/2020   Urine Drug Screen:     Component Value Date/Time   LABOPIA NONE DETECTED 06/08/2020 1349   COCAINSCRNUR NONE DETECTED 06/08/2020 1349   LABBENZ POSITIVE (A) 06/08/2020 1349   AMPHETMU NONE DETECTED 06/08/2020 1349   THCU NONE DETECTED 06/08/2020 1349   LABBARB NONE DETECTED 06/08/2020 1349    Alcohol Level No results found for: [place]  MR BRAIN WO CONTRAST 06/14/2020 IMPRESSION:  1. Motion degraded, incomplete examination.  2. Subacute right parietal infarct.  3. No definite acute infarct identified.  4. Multiple chronic infarcts as above.   TEE 04/01/2020 IMPRESSIONS  1. Left ventricular ejection  fraction, by estimation, is 60 to 65%. The  left ventricle has normal function. The left ventricle has no regional  wall motion abnormalities.  2. Right ventricular systolic function is normal. The right ventricular  size is normal.  3. Left atrial size was moderately dilated. No left atrial/left atrial  appendage thrombus was detected.  4. The mitral valve is normal in structure. Mild mitral valve  regurgitation. No evidence of mitral stenosis.  5. Tricuspid valve regurgitation is mild to moderate.  6. The aortic valve is tricuspid. Aortic valve regurgitation is moderate.  Mild to moderate aortic valve sclerosis/calcification is present, without  any evidence of aortic stenosis.  7. The inferior vena cava is normal in size with greater than 50%  respiratory variability, suggesting right atrial pressure of 3 mmHg.  8. Agitated saline contrast bubble study was negative, with no evidence  of any interatrial shunt.   ECG - SR rate 84 BPM. (See cardiology reading for complete details)  PHYSICAL EXAM  Temp:  [97.1 F (36.2 C)-98.9 F (37.2 C)] 97.8 F (36.6 C) (11/19 1117) Pulse Rate:  [94-103] 101 (11/19 1117) Resp:  [16-21] 18 (11/19 1117) BP: (94-151)/(58-86) 122/70 (11/19 1117) SpO2:  [91 %-98 %] 98 % (  11/19 1055) Weight:  [63.5 kg] 63.5 kg (11/19 0753)  General - Well nourished, well developed, restless, altered.  Ophthalmologic - fundi not visualized due to noncooperation.  Cardiovascular - Regular rhythm and rate.  Neuro - restless, pleasantly demented, global aphasia, very limited words output, able to say "yes, yes, yes", not following commands.  Slightly more interactive than yesterday.  Eyes midposition, not tracking but seems to have left gaze preference, not blinking to visual threat bilaterally. No facial asymmetry, moving all extremities strong equally. Sensation, coordination and gait not tested. Positive grip reflex.   ASSESSMENT/PLAN Sonya George  is a 64 y.o. female with history of HTN, HLD, DM, recent stroke with complete workup 03/2020 (TEE 04/01/20) returns with imaging showing new subacute stroke. She did not receive IV t-PA due to recent stroke.  Rapid progressive dementia  AMS with behavior change, gradually worsening for the last 3 weeks. 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  MRI new subacute right parietal infarct since 03/2020. 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 Multiple other chronic infarct including left PCA, b/l MCA/PCA and left cerebellar. 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  MRI with and without contrast - Subacute right parietal cortical/subcortical infarct as noted previously. Numerous chronic infarcts.  MRA head and neck - unremarkable  May consider cerebral angiogram or brain biopsy to rule out CNS vasculitis  LTM EEG no Sz  EEG 11/7 diffuse encephalopathy   B1, B12, ammonia, TSH, FT4, HIV, RPR all neg  Thyroid antibodies neg  Pan CT no malignancy  LP WBC 0-1, protein 69 and glucose 99  Paraneoplastic panel pending  14-3-3 and RT [facility] pending  on empiric treatment with solumedrol 1g x 5 days  Will let Dr. Leonie Man see her Monday to compare with the condition in 03/2020  Stroke: Subacute right parietal infarct - embolic - source unknown.  MRI head 11/13 - Subacute right parietal infarct. No definite acute infarct identified. Multiple chronic infarcts as above.   MRI with and without contrast 11/18 - Subacute right parietal cortical/subcortical infarct as noted previously. Numerous chronic infarcts.  MRA head and neck - unremarkable  TEE - 04/01/20 - EF 60 - 65%. No cardiac source of emboli identified.   [employer] Virus 2 - negative  HgbA1c - 6.9  UDS - benzodiazepine  VTE prophylaxis - Lovenox  aspirin 81 mg daily and clopidogrel 75 mg daily prior to admission, now  on aspirin 325 mg daily. No DAPT in case of further procedure.  Ongoing aggressive stroke risk factor management  Therapy recommendations: SNF  Disposition:  Pending  Hx of stroke  03/2020 admitted for aphasia and confusion.  CT and MRI showed left frontal parietal infarcts, chronic left frontal, bilateral occipital, left cerebellum infarcts.  MRA unremarkable.  Carotid Doppler negative.  TTE unremarkable.  LDL 94, A1c 6.8.  TEE no source of emboli.  Loop recorder not placed due to no insurance.  Discharged on DAPT and Lipitor 40. Per note, pt on discharge, still had "Receptive and expressive aphasia.Marland KitchenMarland KitchenNot following verbal commands... Nonverbal making unrecognizable sounds and grunts."  Hx of stroke 5-7 years ago, no details  Seizure ruled out  06/07/2020, episode of left arm twitching, altered mental status, decreased response and roving eyes.    CT 11/6 no acute abnormality.    EEG 11/7 negative for seizure  Currently LTM EEG no Sz so  far  D/c LTM EEG  Hypertension  Home BP meds: amlodipine ; lisinopril  Current BP meds: amlodipine  Stable . Long-term BP goal normotensive  Hyperlipidemia  Home Lipid lowering medication: Lipitor 40 mg daily   LDL 94  (03/2020), goal < 70  Current lipid lowering medication: Lipitor 40 mg daily   Continue statin at discharge  Diabetes  Home diabetic meds: metformin  Current diabetic meds: SSI   HgbA1c 6.9, goal < 7.0  CBG monitoring  Other Stroke Risk Factors  Advanced age  Family hx stroke (brother and sister)   Other Active Problems  Code status - Full code  Psych consult   Hospital day # 56  Rosalin Hawking, MD PhD Stroke Neurology 06/20/2020 9:14 PM    To contact Stroke Continuity provider, please refer to [email or web address] After hours, contact General Neurology

## 2020-06-20 NOTE — Progress Notes (Signed)
PROGRESS NOTE        PATIENT DETAILS Name: Sonya George Age: 64 y.o. Sex: female Date of Birth: [birth year] Admit Date: 06/07/2020 Admitting Physician Reubin Milan, MD PYK:DXIPJA, Delfino Lovett, NP  Brief Narrative: Patient is a 64 y.o. female with past medical history of HTN, DM, prior CVA-who presented with altered mental status after getting news of her sister's passing.  She was admitted for further work-up-extensive neuroimaging/EEG negative-evaluated by both neurology and psychiatry-initially thought to have functional/stress-induced etiology for altered mental status-however due to continued speech abnormalities-rapidly fluctuating/progressive mental status changes-neurology reconsulted-LP performed-further work-up underway-see below.  Significant events: 8/28-8/31>> admit to [facility] for work-up of CVA 11/6>> admit to [facility] for confusion 11/17>> urinary retention-Foley inserted 11/17>> IV steroids 1 g daily x5 days started.  Significant studies: 11/6>> CT head: No acute intracranial abnormality. 11/6>> chest x-ray: No active pulmonary disease. 11/6>> HIV nonreactive 11/7>> vitamin B12 and vitamin B1: Within normal limits 11/7>> RPR: Nonreactive 11/7>> TSH: Within normal limits 11/8>> A1c 6.9 11/7>> vitamin B12: 561 11/13>>MRI Brain:Subacute right parietal infarct. 11/15>> CT chest/abdomen/pelvis: Large stool burden, distended bladder-no acute abnormality in the chest, abdomen or pelvis. 11/19>> MRI brain: Subacute right parietal cortical/subcortical infarct 11/19>> MRA head/neck: No large vessel occlusion  Antimicrobial therapy: None  Microbiology data: 11/16>> CSF culture: Negative so far 11/16>> VDRL CSF, HSV PCR CHF, autoimmune panel, oligoclonal bands: Pending  Procedures : 11/7>>spot EEG: Mild diffuse encephalopathy-nonspecific etiology-no seizures. 11/15-11/16>> LTM EEG: No seizures 11/16>> LP by neurology  Consults: Neurology,  psychiatry  DVT Prophylaxis : Lovenox   Subjective: Appears a bit more alert-able to shake my hands-following some commands.  Assessment/Plan: Acute encephalopathy: Initially thought to be functional-stress response to patient's sister passing-however she continues to have significant speech abnormalities that is much different than her usual baseline.  Her mental status continues to fluctuate-suspect she at times has delirium-at times has expressive aphasia.  Per daughter-her current mental status (ongoing for approximately 2-3 weeks) is significantly worse than her usual baseline after her most recent CVA (able to converse in a language-and relatively independent with walking).  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  Had an extensive stroke work-up during her most recent hospitalization.  Acute urinary retention: Occurred on 11/17 with more than 700 cc of urine-Foley inserted.  Will attempt voiding trial in the next 2-3 days.  HLD: Continue statin  HTN: BP stable-continue amlodipine-lisinopril remains on hold.   DM-2 (A1c 6.9 on 11/8) with uncontrolled hyperglycemia due to steroids: CBGs on the higher side today-due to steroids-continued 10 units of Lantus and SSI-follow and adjust.    Recent Labs    06/19/20 2126 06/20/20 0631 06/20/20 1133  GLUCAP 135* 191* 165*    Diet: Diet Order            Diet heart healthy/carb modified Room service appropriate? No; Fluid consistency: Thin  Diet effective now                  Code Status: Full code   Family Communication: Daughter [name] [phone]) at bedside on 11/18   Disposition Plan: Status is: Inpatient  Remains  inpatient appropriate because:Inpatient level of care appropriate due to severity of  illness   Dispo: The patient is from: Home              Anticipated d/c is to: TBD              Anticipated d/c date is: 2 days              Patient currently is not medically stable to d/c.   Barriers to Discharge: Continued altered mental status-not yet at baseline-LTM EEG in progress-LP planned.  Antimicrobial agents: Anti-infectives (From admission, onward)   None       Time spent: 15- minutes-Greater than 50% of this time was spent in counseling, explanation of diagnosis, planning of further management, and coordination of care.  MEDICATIONS: Scheduled Meds: . amLODipine  10 mg Oral Daily  . aspirin  325 mg Oral Daily  . atorvastatin  40 mg Oral q1800  . Chlorhexidine Gluconate Cloth  6 each Topical Daily  . enoxaparin (LOVENOX) injection  40 mg Subcutaneous Q24H  . insulin aspart  0-20 Units Subcutaneous TID WC  . insulin aspart  0-5 Units Subcutaneous QHS  . insulin glargine  10 Units Subcutaneous Daily  . multivitamin with minerals  1 tablet Oral Daily  . polyethylene glycol  17 g Oral Daily  . QUEtiapine  50 mg Oral QHS   Continuous Infusions: . sodium chloride Stopped (06/19/20 0101)  . sodium chloride 50 mL/hr at 06/20/20 0500  . methylPREDNISolone (SOLU-MEDROL) injection 1,000 mg (06/20/20 1246)   PRN Meds:.sodium chloride, acetaminophen **OR** acetaminophen, bisacodyl, diphenhydrAMINE, fentaNYL (SUBLIMAZE) injection, haloperidol lactate, labetalol, midazolam, ondansetron **OR** ondansetron (ZOFRAN) IV, sodium phosphate   PHYSICAL EXAM: Vital signs: Vitals:   06/20/20 0753 06/20/20 1040 06/20/20 1055 06/20/20 1117  BP:  114/75 131/72 122/70  Pulse:  (!) 103 (!) 102 (!) 101  Resp:  18 (!) 21 18  Temp:  (!) 97.1 F (36.2 C) 97.6 F (36.4 C) 97.8 F (36.6 C)  TempSrc:    Axillary  SpO2:  97% 98%   Weight: 63.5 kg     Height: 5\' 6"  (1.676 m)      Filed Weights    06/07/20 0840 06/20/20 0753  Weight: 63.5 kg 63.5 kg   Body mass index is 22.6 kg/m.   Gen Exam: Alert-but not in any distress. HEENT:atraumatic, normocephalic Chest: B/L clear to auscultation anteriorly CVS:S1S2 regular Abdomen:soft non tender, non distended Extremities:no edema Neurology: Non focal Skin: no rash  I have personally reviewed following labs and imaging studies  LABORATORY DATA: CBC: Recent Labs  Lab 06/14/20 0420 06/19/20 0322  WBC 8.4 12.3*  HGB 13.7 14.1  HCT 41.3 41.6  MCV 89.4 89.5  PLT 259 637    Basic Metabolic Panel: Recent Labs  Lab 06/14/20 0420 06/19/20 0322  NA 140 138  K 3.5 3.8  CL 104 105  CO2 25 21*  GLUCOSE 154* 205*  BUN 14 13  CREATININE 0.79 0.74  CALCIUM 9.7 9.7    GFR: Estimated Creatinine Clearance: 65.6 mL/min (by C-G formula based on SCr of 0.74 mg/dL).  Liver Function Tests: Recent Labs  Lab 06/14/20 0420 06/19/20 0322  AST 27 35  ALT 20 20  ALKPHOS 59 63  BILITOT 0.9 0.7  PROT 6.5 7.2  ALBUMIN 3.2* 3.4*   No results for input(s): LIPASE, AMYLASE in the last 168 hours. Recent Labs  Lab 06/14/20 0420  AMMONIA 15    Coagulation Profile: No results for input(s): INR, PROTIME in the last 168 hours.  Cardiac Enzymes:  No results for input(s): CKTOTAL, CKMB, CKMBINDEX, TROPONINI in the last 168 hours.  BNP (last 3 results) No results for input(s): PROBNP in the last 8760 hours.  Lipid Profile: No results for input(s): CHOL, HDL, LDLCALC, TRIG, CHOLHDL, LDLDIRECT in the last 72 hours.  Thyroid Function Tests: No results for input(s): TSH, T4TOTAL, FREET4, T3FREE, THYROIDAB in the last 72 hours.  Anemia Panel: No results for input(s): VITAMINB12, FOLATE, FERRITIN, TIBC, IRON, RETICCTPCT in the last 72 hours.  Urine analysis:    Component Value Date/Time   COLORURINE STRAW (A) 06/08/2020 1349   APPEARANCEUR CLEAR 06/08/2020 1349   LABSPEC 1.015 06/08/2020 1349   PHURINE 8.0 06/08/2020 1349    GLUCOSEU 100 (A) 06/08/2020 1349   HGBUR NEGATIVE 06/08/2020 1349   BILIRUBINUR NEGATIVE 06/08/2020 1349   KETONESUR 15 (A) 06/08/2020 1349   PROTEINUR NEGATIVE 06/08/2020 1349   NITRITE NEGATIVE 06/08/2020 1349   LEUKOCYTESUR NEGATIVE 06/08/2020 1349    Sepsis Labs: Lactic Acid, Venous No results found for: LATICACIDVEN  MICROBIOLOGY: Recent Results (from the past 240 hour(s))  CSF culture     Status: None (Preliminary result)   Collection Time: 06/17/20  7:08 PM   Specimen: CSF  Result Value Ref Range Status   Specimen Description CSF  Final   Special Requests NONE  Final   Gram Stain NO WBC SEEN NO ORGANISMS SEEN   Final   Culture   Final    NO GROWTH 3 DAYS Performed at [facility], [place] [address] [place], [place] [address]    Report Status PENDING  Incomplete    RADIOLOGY STUDIES/RESULTS: MR ANGIO HEAD WO CONTRAST  Result Date: 06/20/2020 CLINICAL DATA:  Altered mental status EXAM: MRI HEAD WITHOUT AND WITH CONTRAST MRA HEAD WITHOUT CONTRAST MRA NECK WITHOUT AND WITH CONTRAST TECHNIQUE: Multiplanar, multiecho pulse sequences of the brain and surrounding structures were obtained without and with intravenous contrast. Angiographic images of the Circle of Willis were obtained using MRA technique without intravenous contrast. Angiographic images of the neck were obtained using MRA technique without and with intravenous contrast. Carotid stenosis measurements (when applicable) are obtained utilizing NASCET criteria, using the distal internal carotid diameter as the denominator. CONTRAST:  6.70mL GADAVIST GADOBUTROL 1 MMOL/ML IV SOLN COMPARISON:  06/15/2019 and 03/30/2019 FINDINGS: MRI HEAD Brain: As seen previously, there is a subacute right parietal cortical/subcortical infarct. There is some associated enhancement. There is unchanged residual diffusion hyperintensity in the area of chronic left parietal infarct. Multiple additional chronic infarcts are again noted  including involvement of the bilateral frontal, parietal, and occipital lobes as well as left greater than right cerebellum and right thalamus. There is susceptibility and T1 shortening associated with some infarcts reflecting chronic blood products and laminar necrosis. Stable calcified meningioma along the right anterior falx without underlying parenchymal edema. Additional areas of T2 hyperintensity in the supratentorial and pontine white matter likely reflect stable chronic microvascular ischemic changes. Ventricles are stable in size. Vascular: Major vessel flow voids at the skull base are preserved. Skull and upper cervical spine: Normal marrow signal is preserved. Sinuses/Orbits: Paranasal sinuses are aerated. Orbits are unremarkable. Other: Sella is unremarkable.  Mastoid air cells are clear. MRA HEAD Intracranial internal carotid arteries are patent. Middle and anterior cerebral arteries are patent. Intracranial vertebral arteries, basilar artery, posterior cerebral arteries are patent. There is no significant stenosis or aneurysm. MRA NECK Common, internal, and external carotid arteries are patent. Extracranial vertebral arteries are patent and codominant. There is no measurable stenosis  or evidence of dissection. IMPRESSION: Subacute right parietal cortical/subcortical infarct as noted previously. Numerous chronic infarcts. No large vessel occlusion, hemodynamically significant stenosis, or evidence of dissection. 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 Electronically Signed   By: Macy Mis M.D.   On: 06/20/2020 14:27   MR ANGIO NECK W WO CONTRAST  Result Date: 06/20/2020 CLINICAL DATA:  Altered mental status EXAM: MRI HEAD WITHOUT AND WITH CONTRAST MRA HEAD WITHOUT CONTRAST MRA NECK WITHOUT AND WITH CONTRAST TECHNIQUE: Multiplanar, multiecho pulse sequences of the brain and surrounding structures were obtained without and with intravenous contrast. Angiographic images of the Circle  of Willis were obtained using MRA technique without intravenous contrast. Angiographic images of the neck were obtained using MRA technique without and with intravenous contrast. Carotid stenosis measurements (when applicable) are obtained utilizing NASCET criteria, using the distal internal carotid diameter as the denominator. CONTRAST:  6.47mL GADAVIST GADOBUTROL 1 MMOL/ML IV SOLN COMPARISON:  06/15/2019 and 03/30/2019 FINDINGS: MRI HEAD Brain: As seen previously, there is a subacute right parietal cortical/subcortical infarct. There is some associated enhancement. There is unchanged residual diffusion hyperintensity in the area of chronic left parietal infarct. Multiple additional chronic infarcts are again noted including involvement of the bilateral frontal, parietal, and occipital lobes as well as left greater than right cerebellum and right thalamus. There is susceptibility and T1 shortening associated with some infarcts reflecting chronic blood products and laminar necrosis. Stable calcified meningioma along the right anterior falx without underlying parenchymal edema. Additional areas of T2 hyperintensity in the supratentorial and pontine white matter likely reflect stable chronic microvascular ischemic changes. Ventricles are stable in size. Vascular: Major vessel flow voids at the skull base are preserved. Skull and upper cervical spine: Normal marrow signal is preserved. Sinuses/Orbits: Paranasal sinuses are aerated. Orbits are unremarkable. Other: Sella is unremarkable.  Mastoid air cells are clear. MRA HEAD Intracranial internal carotid arteries are patent. Middle and anterior cerebral arteries are patent. Intracranial vertebral arteries, basilar artery, posterior cerebral arteries are patent. There is no significant stenosis or aneurysm. MRA NECK Common, internal, and external carotid arteries are patent. Extracranial vertebral arteries are patent and codominant. There is no measurable stenosis or  evidence of dissection. IMPRESSION: Subacute right parietal cortical/subcortical infarct as noted previously. Numerous chronic infarcts. No large vessel occlusion, hemodynamically significant stenosis, or evidence of dissection. 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 Electronically Signed   By: Macy Mis M.D.   On: 06/20/2020 14:27   MR BRAIN W WO CONTRAST  Result Date: 06/20/2020 CLINICAL DATA:  Altered mental status EXAM: MRI HEAD WITHOUT AND WITH CONTRAST MRA HEAD WITHOUT CONTRAST MRA NECK WITHOUT AND WITH CONTRAST TECHNIQUE: Multiplanar, multiecho pulse sequences of the brain and surrounding structures were obtained without and with intravenous contrast. Angiographic images of the Circle of Willis were obtained using MRA technique without intravenous contrast. Angiographic images of the neck were obtained using MRA technique without and with intravenous contrast. Carotid stenosis measurements (when applicable) are obtained utilizing NASCET criteria, using the distal internal carotid diameter as the denominator. CONTRAST:  6.34mL GADAVIST GADOBUTROL 1 MMOL/ML IV SOLN COMPARISON:  06/15/2019 and 03/30/2019 FINDINGS: MRI HEAD Brain: As seen previously, there is a subacute right parietal cortical/subcortical infarct. There is some associated enhancement. There is unchanged residual diffusion hyperintensity in the area of chronic left parietal infarct. Multiple additional chronic infarcts are again noted including involvement of the bilateral frontal, parietal, and occipital lobes as well as left greater than right cerebellum and right thalamus. There is susceptibility  and T1 shortening associated with some infarcts reflecting chronic blood products and laminar necrosis. Stable calcified meningioma along the right anterior falx without underlying parenchymal edema. Additional areas of T2 hyperintensity in the supratentorial and pontine white matter likely reflect stable chronic microvascular  ischemic changes. Ventricles are stable in size. Vascular: Major vessel flow voids at the skull base are preserved. Skull and upper cervical spine: Normal marrow signal is preserved. Sinuses/Orbits: Paranasal sinuses are aerated. Orbits are unremarkable. Other: Sella is unremarkable.  Mastoid air cells are clear. MRA HEAD Intracranial internal carotid arteries are patent. Middle and anterior cerebral arteries are patent. Intracranial vertebral arteries, basilar artery, posterior cerebral arteries are patent. There is no significant stenosis or aneurysm. MRA NECK Common, internal, and external carotid arteries are patent. Extracranial vertebral arteries are patent and codominant. There is no measurable stenosis or evidence of dissection. IMPRESSION: Subacute right parietal cortical/subcortical infarct as noted previously. Numerous chronic infarcts. No large vessel occlusion, hemodynamically significant stenosis, or evidence of dissection. 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 Electronically Signed   By: Macy Mis M.D.   On: 06/20/2020 14:27     LOS: 12 days   Oren Binet, MD  [facility]    To contact the attending provider between 7A-7P or the covering provider during after hours 7P-7A, please log into the web site [email or web address] and access using universal [place] password for that web site. If you do not have the password, please call the hospital operator.  06/20/2020, 3:43 PM

## 2020-06-21 LAB — GLUCOSE, CAPILLARY
Glucose-Capillary: 173 mg/dL — ABNORMAL HIGH (ref 70–99)
Glucose-Capillary: 176 mg/dL — ABNORMAL HIGH (ref 70–99)
Glucose-Capillary: 115 mg/dL — ABNORMAL HIGH (ref 70–99)
Glucose-Capillary: 172 mg/dL — ABNORMAL HIGH (ref 70–99)

## 2020-06-21 LAB — CSF CULTURE W GRAM STAIN
Culture: NO GROWTH
Gram Stain: NONE SEEN

## 2020-06-21 MED ORDER — ASPIRIN EC 325 MG PO TBEC
325.0000 mg | DELAYED_RELEASE_TABLET | Freq: Every day | ORAL | Status: DC
  Administered 2020-06-21 – 2020-06-25 (×5): 325 mg via ORAL
  Filled 2020-06-21 (×5): qty 1

## 2020-06-21 MED ORDER — TAMSULOSIN HCL 0.4 MG PO CAPS
0.4000 mg | ORAL_CAPSULE | Freq: Every day | ORAL | Status: DC
  Administered 2020-06-21 – 2020-06-25 (×5): 0.4 mg via ORAL
  Filled 2020-06-21 (×5): qty 1

## 2020-06-21 MED ORDER — DIPHENHYDRAMINE HCL 12.5 MG/5ML PO ELIX: 12.5000 mg | ORAL_SOLUTION | Freq: Three times a day (TID) | ORAL | Status: DC | PRN

## 2020-06-21 MED ORDER — AMLODIPINE BESYLATE 5 MG PO TABS
5.0000 mg | ORAL_TABLET | Freq: Every day | ORAL | Status: DC
  Administered 2020-06-21 – 2020-06-25 (×5): 5 mg via ORAL
  Filled 2020-06-21 (×6): qty 1

## 2020-06-21 NOTE — Progress Notes (Signed)
Occupational Therapy Treatment Patient Details Name: Sonya George MRN: [id number] DOB: [birth year] Today's Date: 06/21/2020    History of present illness 64 y.o. female admitted with AMS with difficulty communicating. 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 pt noncompliant spits out medications PMH CVA HTN DM seizures   OT comments  Patient continues to make progress towards goals in skilled OT session. Patient's session encompassed self feeding, following commands, overall assessment of cognition. Pt continues to grab for items and therapist when in session, often attempting to place therapist's arm in mouth (suspected visual deficits but unable to provide conclusive answer due to cognition). Pt able to complete a dozen spoonfuls of applesauce with max A before falling asleep. Discharge remains appropriate, will continue to follow acutely.    Follow Up Recommendations  SNF;Other (comment) (family to d/c home with pt so HHOT)    Equipment Recommendations  3 in 1 bedside commode;Wheelchair (measurements OT);Wheelchair cushion (measurements OT)    Recommendations for Other Services      Precautions / Restrictions Precautions Precautions: Fall;Other (comment) Precaution Comments: tele safety sitter; mitts Restrictions Weight Bearing Restrictions: No       Mobility Bed Mobility               General bed mobility comments: Repositioned multiple times due to significant lean to the left, bolstered with pillows  Transfers                 General transfer comment: Deferred    Balance                                           ADL either performed or assessed with clinical judgement   ADL Overall ADL's : Needs assistance/impaired Eating/Feeding: Maximal assistance;Cueing for safety;Cueing for sequencing Eating/Feeding Details (indicate cue type and reason): Max A to self feed, lacking coordination in movement, often grabbing a therapists  hand or arm instead of applesauce and trying to place it in mouth Grooming: Wash/dry face;Supervision/safety;Set up;Bed level Grooming Details (indicate cue type and reason): pt able to wipe face with increased time and tactile cues                             Functional mobility during ADLs: Maximal assistance;Moderate assistance;+2 for physical assistance;Cueing for safety General ADL Comments: session limited due to not having plus 2 assist, worked on following commands, cognition, and self feeding     [place]: Lethargic Behavior During Therapy: Restless;Impulsive Overall Cognitive Status: Difficult to assess Area of Impairment: Attention;Following commands;Safety/judgement;Awareness                   Current Attention Level: Alternating   Following Commands: Follows one step commands inconsistently;Follows one step commands with increased time Safety/Judgement: Decreased awareness of safety;Decreased awareness of deficits Awareness: Intellectual   General Comments: Pt awake and sitting up in bed upon arrival. Pt continuously grabbing at various objects and therapist but able to follow multi-modal cues for self feeding and washing face inconsistently        Exercises     Shoulder Instructions       General Comments      Pertinent Vitals/ Pain       Pain  Assessment: Faces Faces Pain Scale: No hurt  Home Living                                          Prior Functioning/Environment              Frequency  Min 2X/week        Progress Toward Goals  OT Goals(current goals can now be found in the care plan section)  Progress towards OT goals: Progressing toward goals  Acute Rehab OT Goals Patient Stated Goal: Unable to state OT Goal Formulation: With family Time For Goal Achievement: 06/25/20 Potential to Achieve Goals: Good  Plan Discharge plan remains  appropriate;Frequency remains appropriate    Co-evaluation                 AM-PAC OT "6 Clicks" Daily Activity     Outcome Measure   Help from another person eating meals?: A Lot Help from another person taking care of personal grooming?: A Lot Help from another person toileting, which includes using toliet, bedpan, or urinal?: Total Help from another person bathing (including washing, rinsing, drying)?: A Lot Help from another person to put on and taking off regular upper body clothing?: A Lot Help from another person to put on and taking off regular lower body clothing?: Total 6 Click Score: 10    End of Session    OT Visit Diagnosis: Unsteadiness on feet (R26.81);Muscle weakness (generalized) (M62.81)   Activity Tolerance Patient limited by fatigue;Patient limited by lethargy   Patient Left in bed;with call bell/phone within reach;with bed alarm set   Nurse Communication Mobility status        Time: 6004-5997 OT Time Calculation (min): 12 min  Charges: OT General Charges $OT Visit: 1 Visit OT Treatments $Self Care/Home Management : 8-22 mins  Corinne Ports E. Stanley, [facility] [place] 06/21/2020, 3:34 PM

## 2020-06-21 NOTE — Progress Notes (Signed)
PHARMACIST - PHYSICIAN COMMUNICATION  DR:   Sloan Leiter  CONCERNING: IV to Oral Route Change Policy  RECOMMENDATION: This patient is receiving Benadryl by the intravenous route.  Based on criteria approved by the Pharmacy and Therapeutics Committee, the intravenous medication(s) is/are being converted to the equivalent oral dose form(s).   DESCRIPTION: These criteria include:  The patient is eating (either orally or via tube) and/or has been taking other orally administered medications for a least 24 hours  The patient has no evidence of active gastrointestinal bleeding or impaired GI absorption (gastrectomy, short bowel, patient on TNA or NPO).  If you have questions about this conversion, please contact the Pharmacy Department  []   [phone] )  [facility] []   [phone] )  [facility] [x]   [phone] )  [facility] []   [phone] )  [facility] []   [phone] )  [place], [place] 06/21/2020 8:12 AM

## 2020-06-21 NOTE — Progress Notes (Signed)
STROKE TEAM PROGRESS NOTE   INTERVAL HISTORY No improvement in overall condition and neurological function but also no worsening.  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  She has had recurrent large vessel cortical infarcts.  OBJECTIVE Vitals:   06/21/20 0019 06/21/20 0326 06/21/20 0736 06/21/20 1247  BP: 128/90 104/70 120/74 134/83  Pulse: 98 84 (!) 101 88  Resp:  18 18 18   Temp: 98.1 F (36.7 C) 97.8 F (36.6 C) 98.5 F (36.9 C) 98.2 F (36.8 C)  TempSrc: Axillary Axillary Oral Oral  SpO2: 98% 98% 97% 100%  Weight:      Height:        CBC:  Recent Labs  Lab 06/19/20 0322  WBC 12.3*  HGB 14.1  HCT 41.6  MCV 89.5  PLT 160    Basic Metabolic Panel:  Recent Labs  Lab 06/19/20 0322  NA 138  K 3.8  CL 105  CO2 21*  GLUCOSE 205*  BUN 13  CREATININE 0.74  CALCIUM 9.7    Lipid Panel:     Component Value Date/Time   CHOL 149 03/30/2020 0531   TRIG 82 03/30/2020 0531   HDL 38 (L) 03/30/2020 0531   CHOLHDL 3.9 03/30/2020 0531   VLDL 16 03/30/2020 0531   LDLCALC 95 03/30/2020 0531   HgbA1c:  Lab Results  Component Value Date   HGBA1C 6.9 (H) 06/09/2020   Urine Drug Screen:     Component Value Date/Time   LABOPIA NONE DETECTED 06/08/2020 1349   COCAINSCRNUR NONE DETECTED 06/08/2020 1349   LABBENZ POSITIVE (A) 06/08/2020 1349   AMPHETMU NONE DETECTED 06/08/2020 1349   THCU NONE DETECTED 06/08/2020 1349   LABBARB NONE DETECTED 06/08/2020 1349    Alcohol Level No results found for: [place]at[place]  MR BRAIN WO CONTRAST 06/14/2020 IMPRESSION:  1. Motion degraded, incomplete examination.  2. Subacute right parietal infarct.  3. No definite acute infarct identified.  4. Multiple chronic infarcts as above.   TEE 04/01/2020 IMPRESSIONS  1. Left ventricular ejection fraction, by estimation, is 60 to 65%. The  left ventricle has normal function. The left ventricle has no regional  wall motion abnormalities.  2. Right  ventricular systolic function is normal. The right ventricular  size is normal.  3. Left atrial size was moderately dilated. No left atrial/left atrial  appendage thrombus was detected.  4. The mitral valve is normal in structure. Mild mitral valve  regurgitation. No evidence of mitral stenosis.  5. Tricuspid valve regurgitation is mild to moderate.  6. The aortic valve is tricuspid. Aortic valve regurgitation is moderate.  Mild to moderate aortic valve sclerosis/calcification is present, without  any evidence of aortic stenosis.  7. The inferior vena cava is normal in size with greater than 50%  respiratory variability, suggesting right atrial pressure of 3 mmHg.  8. Agitated saline contrast bubble study was negative, with no evidence  of any interatrial shunt.   ECG - SR rate 84 BPM. (See cardiology reading for complete details)  PHYSICAL EXAM  Temp:  [97.8 F (36.6 C)-98.5 F (36.9 C)] 98.2 F (36.8 C) (11/20 1247) Pulse Rate:  [84-112] 88 (11/20 1247) Resp:  [18] 18 (11/20 1247) BP: (104-134)/(70-97) 134/83 (11/20 1247) SpO2:  [97 %-100 %] 100 % (11/20 1247)  General - Well nourished, well developed, restless, altered.  Ophthalmologic - fundi not visualized due to noncooperation.  Cardiovascular - Regular rhythm and rate.  Neuro -   she lays in bed with eyes  closed.  She opens her eyes to sternal stimulus but does not follow commands.  She is essentially mute today.  Eyes midposition, no gaze preference gaze preference, not blinking to visual threat bilaterally. No facial asymmetry, moving all extremities strong equally. Sensation, coordination and gait not tested. Positive grip reflex.   The brain MRI scan is reviewed in person and shows increase signal on DWI involving parietal areas bilaterally much more on the left side suggesting subacute left parietal infarct.  There is remote infarcts involving the left occipital lobe, left frontal lobe, bilateral parietal lobes  and inferior cerebellum.   ASSESSMENT/PLAN Sonya George is a 64 y.o. female with history of HTN, HLD, DM, recent stroke with complete workup 03/2020 (TEE 04/01/20) returns with imaging showing new subacute stroke. She did not receive IV t-PA due to recent stroke.  Rapid progressive dementia  AMS with behavior change, gradually worsening for the last 3 weeks. 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  MRI new subacute right parietal infarct since 03/2020. 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 Multiple other chronic infarct including left PCA, b/l MCA/PCA and left cerebellar. 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  MRI with and without contrast - Subacute right parietal cortical/subcortical infarct as noted previously. Numerous chronic infarcts.  MRA head and neck - unremarkable  May consider cerebral angiogram or brain biopsy to rule out CNS vasculitis  LTM EEG no Sz  EEG 11/7 diffuse encephalopathy   B1, B12, ammonia, TSH, FT4, HIV, RPR all neg  Thyroid antibodies neg  CSF culture - No growth x 3 days  Pan CT no malignancy  LP WBC 0-1, protein 69 and glucose 99  Paraneoplastic panel pending  14-3-3 and RT QuIC pending  On empiric treatment with solumedrol 1g x 5 days  Will let Dr. Leonie Man see her Monday to compare with the condition in 03/2020  Stroke: Subacute right parietal infarct - embolic - source unknown.  MRI head 11/13 - Subacute right parietal infarct. No definite acute infarct identified. Multiple chronic infarcts as above.   MRI with and without contrast 11/18 - Subacute right parietal cortical/subcortical infarct as noted previously. Numerous chronic infarcts.  MRA head and neck - unremarkable  TEE - 04/01/20 - EF 60 - 65%. No cardiac source of emboli identified.   [employer] Virus 2 - negative  HgbA1c - 6.9  UDS -  benzodiazepine  VTE prophylaxis - Lovenox  aspirin 81 mg daily and clopidogrel 75 mg daily prior to admission, now on aspirin 325 mg daily. No DAPT in case of further procedure.  Ongoing aggressive stroke risk factor management  Therapy recommendations: SNF  Disposition:  Pending  Hx of stroke  03/2020 admitted for aphasia and confusion.  CT and MRI showed left frontal parietal infarcts, chronic left frontal, bilateral occipital, left cerebellum infarcts.  MRA unremarkable.  Carotid Doppler negative.  TTE unremarkable.  LDL 94, A1c 6.8.  TEE no source of emboli.  Loop recorder not placed due to no insurance.  Discharged on DAPT and Lipitor 40. Per note, pt on discharge, still had "Receptive and expressive aphasia.Marland KitchenMarland KitchenNot following verbal commands... Nonverbal making unrecognizable sounds and grunts."  Hx of stroke 5-7 years ago, no details  Seizure ruled out  06/07/2020, episode of left arm twitching, altered mental status, decreased response and roving eyes.    CT 11/6 no acute abnormality.  EEG 11/7 negative for seizure  Currently LTM EEG no Sz so far  D/c LTM EEG  Hypertension  Home BP meds: amlodipine ; lisinopril  Current BP meds: amlodipine  Stable . Long-term BP goal normotensive  Hyperlipidemia  Home Lipid lowering medication: Lipitor 40 mg daily   LDL 94  (03/2020), goal < 70  Current lipid lowering medication: Lipitor 40 mg daily   Continue statin at discharge  Diabetes  Home diabetic meds: metformin  Current diabetic meds: SSI   HgbA1c 6.9, goal < 7.0  CBG monitoring  Other Stroke Risk Factors  Advanced age  Family hx stroke (brother and sister)   Other Active Problems  Code status - Full code  Psych consult 11/9 -> Seroquel and citalopram recommended   Hospital day # 31  Dr. Merlene Laughter -- The patient was seen and examined by me; notes, chart and tests reviewed and discussed with midlevel provider, other providers, patient, and family.       To contact Stroke Continuity provider, please refer to [email or web address] After hours, contact General Neurology

## 2020-06-21 NOTE — Progress Notes (Signed)
PROGRESS NOTE        PATIENT DETAILS Name: Sonya George Age: 64 y.o. Sex: female Date of Birth: [birth year] Admit Date: 06/07/2020 Admitting Physician Reubin Milan, MD DPO:[id number], Delfino Lovett, NP  Brief Narrative: Patient is a 64 y.o. female with past medical history of HTN, DM, prior CVA-who presented with altered mental status after getting news of her sister's passing.  She was admitted for further work-up-extensive neuroimaging/EEG negative-evaluated by both neurology and psychiatry-initially thought to have functional/stress-induced etiology for altered mental status-however due to continued speech abnormalities-rapidly fluctuating/progressive mental status changes-neurology reconsulted-LP performed-further work-up underway-see below.  Significant events: 8/28-8/31>> admit to [facility] for work-up of CVA 11/6>> admit to [facility] for confusion 11/17>> urinary retention-Foley inserted 11/17>> IV steroids 1 g daily x5 days started.  Significant studies: 11/6>> CT head: No acute intracranial abnormality. 11/6>> chest x-ray: No active pulmonary disease. 11/6>> HIV nonreactive 11/7>> vitamin B12 and vitamin B1: Within normal limits 11/7>> RPR: Nonreactive 11/7>> TSH: Within normal limits 11/8>> A1c 6.9 11/7>> vitamin B12: 561 11/13>>MRI Brain:Subacute right parietal infarct. 11/15>> CT chest/abdomen/pelvis: Large stool burden, distended bladder-no acute abnormality in the chest, abdomen or pelvis. 11/16>> CSF VDRL nonreactive, CSF oligoclonal band: Not detected 11/16>> CSF HSV PCR, autoimmune antibody, [place] [facility] Nasuti panel: Pending 11/19>> MRI brain: Subacute right parietal cortical/subcortical infarct 11/19>> MRA head/neck: No large vessel occlusion  Antimicrobial therapy: None  Microbiology data: 11/16>> CSF culture: Negative so far  Procedures : 11/7>>spot EEG: Mild diffuse encephalopathy-nonspecific etiology-no seizures. 11/15-11/16>> LTM EEG: No  seizures 11/16>> LP by neurology  Consults: Neurology, psychiatry  DVT Prophylaxis : Lovenox   Subjective: Appears a bit more alert-able to shake my hands-following some commands.  Assessment/Plan: Acute encephalopathy: Initially thought to be functional-stress response to patient's sister passing-however she continues to have significant speech abnormalities that is much different than her usual baseline.  Her mental status continues to fluctuate-suspect she at times has delirium-at times has expressive aphasia.  Per daughter-her current mental status (ongoing for approximately 2-3 weeks) is significantly worse than her usual baseline after her most recent CVA (able to converse in a language-and relatively independent with walking).  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  Subacute CVA-recent hospitalization for CVA: Unlikely causing her confusion/AMS-continue aspirin/statin.  Had an extensive stroke work-up during her most recent hospitalization.  Acute urinary retention: Occurred on 11/17 with more than 700 cc of urine-Foley inserted.  Will attempt voiding trial in the next 2-3 days.  Continue Flomax.  HLD: Continue statin  HTN: BP stable-continue amlodipine-lisinopril remains on hold.   DM-2 (A1c 6.9 on 11/8) with uncontrolled hyperglycemia due to steroids: CBGs on the higher side today-due to steroids-continued 10 units of Lantus and SSI-follow and adjust.    Recent Labs    06/20/20 1605 06/20/20 2117 06/21/20 0618  GLUCAP 147* 288* 173*    Diet: Diet Order            Diet heart healthy/carb modified Room service appropriate? No; Fluid consistency: Thin  Diet effective now                  Code Status: Full code   Family  Communication: Daughter [name] [phone]) at bedside on 11/20   Disposition Plan:  Status is: Inpatient  Remains inpatient appropriate because:Inpatient level of care appropriate due to severity of illness   Dispo: The patient is from: Home              Anticipated d/c is to: TBD              Anticipated d/c date is: 2 days              Patient currently is not medically stable to d/c.   Barriers to Discharge: Continued altered mental status-not yet at baseline-IV steroid daily for 5 days  Antimicrobial agents: Anti-infectives (From admission, onward)   None       Time spent: 15- minutes-Greater than 50% of this time was spent in counseling, explanation of diagnosis, planning of further management, and coordination of care.  MEDICATIONS: Scheduled Meds: . amLODipine  10 mg Oral Daily  . aspirin EC  325 mg Oral Daily  . atorvastatin  40 mg Oral q1800  . Chlorhexidine Gluconate Cloth  6 each Topical Daily  . enoxaparin (LOVENOX) injection  40 mg Subcutaneous Q24H  . insulin aspart  0-20 Units Subcutaneous TID WC  . insulin aspart  0-5 Units Subcutaneous QHS  . insulin glargine  10 Units Subcutaneous Daily  . multivitamin with minerals  1 tablet Oral Daily  . polyethylene glycol  17 g Oral Daily  . QUEtiapine  50 mg Oral QHS   Continuous Infusions: . sodium chloride Stopped (06/19/20 0101)  . sodium chloride 50 mL/hr at 06/20/20 2154  . methylPREDNISolone (SOLU-MEDROL) injection 1,000 mg (06/20/20 1246)   PRN Meds:.sodium chloride, acetaminophen **OR** acetaminophen, bisacodyl, diphenhydrAMINE, fentaNYL (SUBLIMAZE) injection, haloperidol lactate, labetalol, midazolam, ondansetron **OR** ondansetron (ZOFRAN) IV, sodium phosphate   PHYSICAL EXAM: Vital signs: Vitals:   06/20/20 1956 06/21/20 0019 06/21/20 0326 06/21/20 0736  BP: (!) 127/97 128/90 104/70 120/74  Pulse: (!) 112 98 84 (!) 101  Resp: 18  18 18   Temp: 98.2 F (36.8 C) 98.1 F (36.7 C) 97.8 F  (36.6 C) 98.5 F (36.9 C)  TempSrc: Axillary Axillary Axillary Oral  SpO2: 99% 98% 98% 97%  Weight:      Height:       Filed Weights   06/07/20 0840 06/20/20 0753  Weight: 63.5 kg 63.5 kg   Body mass index is 22.6 kg/m.   Gen Exam: Alert-but not in any distress. HEENT:atraumatic, normocephalic Chest: B/L clear to auscultation anteriorly CVS:S1S2 regular Abdomen:soft non tender, non distended Extremities:no edema Neurology: Non focal Skin: no rash  I have personally reviewed following labs and imaging studies  LABORATORY DATA: CBC: Recent Labs  Lab 06/19/20 0322  WBC 12.3*  HGB 14.1  HCT 41.6  MCV 89.5  PLT 099    Basic Metabolic Panel: Recent Labs  Lab 06/19/20 0322  NA 138  K 3.8  CL 105  CO2 21*  GLUCOSE 205*  BUN 13  CREATININE 0.74  CALCIUM 9.7    GFR: Estimated Creatinine Clearance: 65.6 mL/min (by C-G formula based on SCr of 0.74 mg/dL).  Liver Function Tests: Recent Labs  Lab 06/19/20 0322  AST 35  ALT 20  ALKPHOS 63  BILITOT 0.7  PROT 7.2  ALBUMIN 3.4*   No results for input(s): LIPASE, AMYLASE in the last 168 hours. No results for input(s): AMMONIA in the last 168 hours.  Coagulation Profile: No results for input(s): INR, PROTIME in the last 168 hours.  Cardiac Enzymes: No results for input(s): CKTOTAL, CKMB, CKMBINDEX, TROPONINI in the last  168 hours.  BNP (last 3 results) No results for input(s): PROBNP in the last 8760 hours.  Lipid Profile: No results for input(s): CHOL, HDL, LDLCALC, TRIG, CHOLHDL, LDLDIRECT in the last 72 hours.  Thyroid Function Tests: No results for input(s): TSH, T4TOTAL, FREET4, T3FREE, THYROIDAB in the last 72 hours.  Anemia Panel: No results for input(s): VITAMINB12, FOLATE, FERRITIN, TIBC, IRON, RETICCTPCT in the last 72 hours.  Urine analysis:    Component Value Date/Time   COLORURINE STRAW (A) 06/08/2020 1349   APPEARANCEUR CLEAR 06/08/2020 1349   LABSPEC 1.015 06/08/2020 1349    PHURINE 8.0 06/08/2020 1349   GLUCOSEU 100 (A) 06/08/2020 1349   HGBUR NEGATIVE 06/08/2020 1349   BILIRUBINUR NEGATIVE 06/08/2020 1349   KETONESUR 15 (A) 06/08/2020 1349   PROTEINUR NEGATIVE 06/08/2020 1349   NITRITE NEGATIVE 06/08/2020 1349   LEUKOCYTESUR NEGATIVE 06/08/2020 1349    Sepsis Labs: Lactic Acid, Venous No results found for: LATICACIDVEN  MICROBIOLOGY: Recent Results (from the past 240 hour(s))  CSF culture     Status: None (Preliminary result)   Collection Time: 06/17/20  7:08 PM   Specimen: CSF  Result Value Ref Range Status   Specimen Description CSF  Final   Special Requests NONE  Final   Gram Stain NO WBC SEEN NO ORGANISMS SEEN   Final   Culture   Final    NO GROWTH 3 DAYS Performed at [facility], [facility] [address] [place], [place] [address]    Report Status PENDING  Incomplete    RADIOLOGY STUDIES/RESULTS: MR ANGIO HEAD WO CONTRAST  Result Date: 06/20/2020 CLINICAL DATA:  Altered mental status EXAM: MRI HEAD WITHOUT AND WITH CONTRAST MRA HEAD WITHOUT CONTRAST MRA NECK WITHOUT AND WITH CONTRAST TECHNIQUE: Multiplanar, multiecho pulse sequences of the brain and surrounding structures were obtained without and with intravenous contrast. Angiographic images of the Circle of Willis were obtained using MRA technique without intravenous contrast. Angiographic images of the neck were obtained using MRA technique without and with intravenous contrast. Carotid stenosis measurements (when applicable) are obtained utilizing NASCET criteria, using the distal internal carotid diameter as the denominator. CONTRAST:  6.74mL GADAVIST GADOBUTROL 1 MMOL/ML IV SOLN COMPARISON:  06/15/2019 and 03/30/2019 FINDINGS: MRI HEAD Brain: As seen previously, there is a subacute right parietal cortical/subcortical infarct. There is some associated enhancement. There is unchanged residual diffusion hyperintensity in the area of chronic left parietal infarct. Multiple additional chronic  infarcts are again noted including involvement of the bilateral frontal, parietal, and occipital lobes as well as left greater than right cerebellum and right thalamus. There is susceptibility and T1 shortening associated with some infarcts reflecting chronic blood products and laminar necrosis. Stable calcified meningioma along the right anterior falx without underlying parenchymal edema. Additional areas of T2 hyperintensity in the supratentorial and pontine white matter likely reflect stable chronic microvascular ischemic changes. Ventricles are stable in size. Vascular: Major vessel flow voids at the skull base are preserved. Skull and upper cervical spine: Normal marrow signal is preserved. Sinuses/Orbits: Paranasal sinuses are aerated. Orbits are unremarkable. Other: Sella is unremarkable.  Mastoid air cells are clear. MRA HEAD Intracranial internal carotid arteries are patent. Middle and anterior cerebral arteries are patent. Intracranial vertebral arteries, basilar artery, posterior cerebral arteries are patent. There is no significant stenosis or aneurysm. MRA NECK Common, internal, and external carotid arteries are patent. Extracranial vertebral arteries are patent and codominant. There is no measurable stenosis or evidence of dissection. IMPRESSION: Subacute right parietal cortical/subcortical infarct as  noted previously. Numerous chronic infarcts. No large vessel occlusion, hemodynamically significant stenosis, or evidence of dissection. 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 Electronically Signed   By: Macy Mis M.D.   On: 06/20/2020 14:27   MR ANGIO NECK W WO CONTRAST  Result Date: 06/20/2020 CLINICAL DATA:  Altered mental status EXAM: MRI HEAD WITHOUT AND WITH CONTRAST MRA HEAD WITHOUT CONTRAST MRA NECK WITHOUT AND WITH CONTRAST TECHNIQUE: Multiplanar, multiecho pulse sequences of the brain and surrounding structures were obtained without and with intravenous contrast.  Angiographic images of the Circle of Willis were obtained using MRA technique without intravenous contrast. Angiographic images of the neck were obtained using MRA technique without and with intravenous contrast. Carotid stenosis measurements (when applicable) are obtained utilizing NASCET criteria, using the distal internal carotid diameter as the denominator. CONTRAST:  6.75mL GADAVIST GADOBUTROL 1 MMOL/ML IV SOLN COMPARISON:  06/15/2019 and 03/30/2019 FINDINGS: MRI HEAD Brain: As seen previously, there is a subacute right parietal cortical/subcortical infarct. There is some associated enhancement. There is unchanged residual diffusion hyperintensity in the area of chronic left parietal infarct. Multiple additional chronic infarcts are again noted including involvement of the bilateral frontal, parietal, and occipital lobes as well as left greater than right cerebellum and right thalamus. There is susceptibility and T1 shortening associated with some infarcts reflecting chronic blood products and laminar necrosis. Stable calcified meningioma along the right anterior falx without underlying parenchymal edema. Additional areas of T2 hyperintensity in the supratentorial and pontine white matter likely reflect stable chronic microvascular ischemic changes. Ventricles are stable in size. Vascular: Major vessel flow voids at the skull base are preserved. Skull and upper cervical spine: Normal marrow signal is preserved. Sinuses/Orbits: Paranasal sinuses are aerated. Orbits are unremarkable. Other: Sella is unremarkable.  Mastoid air cells are clear. MRA HEAD Intracranial internal carotid arteries are patent. Middle and anterior cerebral arteries are patent. Intracranial vertebral arteries, basilar artery, posterior cerebral arteries are patent. There is no significant stenosis or aneurysm. MRA NECK Common, internal, and external carotid arteries are patent. Extracranial vertebral arteries are patent and codominant. There  is no measurable stenosis or evidence of dissection. IMPRESSION: Subacute right parietal cortical/subcortical infarct as noted previously. Numerous chronic infarcts. No large vessel occlusion, hemodynamically significant stenosis, or evidence of dissection. 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 Electronically Signed   By: Macy Mis M.D.   On: 06/20/2020 14:27   MR BRAIN W WO CONTRAST  Result Date: 06/20/2020 CLINICAL DATA:  Altered mental status EXAM: MRI HEAD WITHOUT AND WITH CONTRAST MRA HEAD WITHOUT CONTRAST MRA NECK WITHOUT AND WITH CONTRAST TECHNIQUE: Multiplanar, multiecho pulse sequences of the brain and surrounding structures were obtained without and with intravenous contrast. Angiographic images of the Circle of Willis were obtained using MRA technique without intravenous contrast. Angiographic images of the neck were obtained using MRA technique without and with intravenous contrast. Carotid stenosis measurements (when applicable) are obtained utilizing NASCET criteria, using the distal internal carotid diameter as the denominator. CONTRAST:  6.39mL GADAVIST GADOBUTROL 1 MMOL/ML IV SOLN COMPARISON:  06/15/2019 and 03/30/2019 FINDINGS: MRI HEAD Brain: As seen previously, there is a subacute right parietal cortical/subcortical infarct. There is some associated enhancement. There is unchanged residual diffusion hyperintensity in the area of chronic left parietal infarct. Multiple additional chronic infarcts are again noted including involvement of the bilateral frontal, parietal, and occipital lobes as well as left greater than right cerebellum and right thalamus. There is susceptibility and T1 shortening associated with some infarcts reflecting chronic blood products  and laminar necrosis. Stable calcified meningioma along the right anterior falx without underlying parenchymal edema. Additional areas of T2 hyperintensity in the supratentorial and pontine white matter likely reflect  stable chronic microvascular ischemic changes. Ventricles are stable in size. Vascular: Major vessel flow voids at the skull base are preserved. Skull and upper cervical spine: Normal marrow signal is preserved. Sinuses/Orbits: Paranasal sinuses are aerated. Orbits are unremarkable. Other: Sella is unremarkable.  Mastoid air cells are clear. MRA HEAD Intracranial internal carotid arteries are patent. Middle and anterior cerebral arteries are patent. Intracranial vertebral arteries, basilar artery, posterior cerebral arteries are patent. There is no significant stenosis or aneurysm. MRA NECK Common, internal, and external carotid arteries are patent. Extracranial vertebral arteries are patent and codominant. There is no measurable stenosis or evidence of dissection. IMPRESSION: Subacute right parietal cortical/subcortical infarct as noted previously. Numerous chronic infarcts. No large vessel occlusion, hemodynamically significant stenosis, or evidence of dissection. 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 Electronically Signed   By: Macy Mis M.D.   On: 06/20/2020 14:27     LOS: 13 days   Oren Binet, MD  [facility]    To contact the attending provider between 7A-7P or the covering provider during after hours 7P-7A, please log into the web site [email or web address] and access using universal [place] password for that web site. If you do not have the password, please call the hospital operator.  06/21/2020, 9:19 AM

## 2020-06-22 LAB — GLUCOSE, CAPILLARY
Glucose-Capillary: 155 mg/dL — ABNORMAL HIGH (ref 70–99)
Glucose-Capillary: 118 mg/dL — ABNORMAL HIGH (ref 70–99)
Glucose-Capillary: 204 mg/dL — ABNORMAL HIGH (ref 70–99)
Glucose-Capillary: 143 mg/dL — ABNORMAL HIGH (ref 70–99)

## 2020-06-22 NOTE — Progress Notes (Signed)
PROGRESS NOTE        PATIENT DETAILS Name: Sonya George Age: 64 y.o. Sex: female Date of Birth: [birth year] Admit Date: 06/07/2020 Admitting Physician Reubin Milan, MD [id number], Delfino Lovett, NP  Brief Narrative: Patient is a 64 y.o. female with past medical history of HTN, DM, prior CVA-who presented with altered mental status after getting news of her sister's passing.  She was admitted for further work-up-extensive neuroimaging/EEG negative-evaluated by both neurology and psychiatry-initially thought to have functional/stress-induced etiology for altered mental status-however due to continued speech abnormalities-rapidly fluctuating/progressive mental status changes-neurology reconsulted-LP performed-further work-up underway-see below.  Significant events: 8/28-8/31>> admit to [facility] for work-up of CVA 11/6>> admit to [facility] for confusion 11/17>> urinary retention-Foley inserted 11/17>> IV steroids 1 g daily x5 days started.  Significant studies: 11/6>> CT head: No acute intracranial abnormality. 11/6>> chest x-ray: No active pulmonary disease. 11/6>> HIV nonreactive 11/7>> vitamin B12 and vitamin B1: Within normal limits 11/7>> RPR: Nonreactive 11/7>> TSH: Within normal limits 11/8>> A1c 6.9 11/7>> vitamin B12: 561 11/13>>MRI Brain:Subacute right parietal infarct. 11/15>> CT chest/abdomen/pelvis: Large stool burden, distended bladder-no acute abnormality in the chest, abdomen or pelvis. 11/16>> CSF VDRL nonreactive, CSF oligoclonal band: Not detected 11/16>> CSF HSV PCR, autoimmune antibody, [place] [facility] Nasuti panel: Pending 11/19>> MRI brain: Subacute right parietal cortical/subcortical infarct 11/19>> MRA head/neck: No large vessel occlusion  Antimicrobial therapy: None  Microbiology data: 11/16>> CSF culture: Negative so far  Procedures : 11/7>>spot EEG: Mild diffuse encephalopathy-nonspecific etiology-no seizures. 11/15-11/16>> LTM EEG: No  seizures 11/16>> LP by neurology  Consults: Neurology, psychiatry  DVT Prophylaxis : Lovenox   Subjective:  In bed sitting up and having breakfast, appears to be in no distress.  Assessment/Plan:  Acute encephalopathy: Initially thought to be functional-stress response to patient's sister passing-however she continues to have significant speech abnormalities that is much different than her usual baseline.  Her mental status continues to fluctuate-suspect she at times has delirium-at times has expressive aphasia.  Per daughter-her current mental status (ongoing for approximately 2-3 weeks) is significantly worse than her usual baseline after her most recent CVA (able to converse in a language-and relatively independent with walking).  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  Subacute CVA-recent hospitalization for CVA: Unlikely causing her confusion/AMS-continue aspirin/statin.  Had an extensive stroke work-up during her most recent hospitalization.  Acute urinary retention: Occurred on 11/17 with more than 700 cc of urine-Foley inserted.  Will attempt voiding trial in the next 2-3 days.  Continue Flomax.  HLD: Continue statin  HTN: BP stable-continue amlodipine-lisinopril remains on hold.   DM-2 (A1c 6.9 on 11/8) with uncontrolled hyperglycemia due to steroids: CBGs on the higher side today-due to steroids-continued 10 units of Lantus and SSI-follow and adjust.    Recent Labs    06/21/20 1526 06/21/20 2017 06/22/20 0904  GLUCAP 115* 172* 155*    Diet: Diet Order            Diet heart healthy/carb modified Room service appropriate? No; Fluid consistency: Thin  Diet effective now                  Code Status: Full code   Family  Communication: Daughter [name] [phone]) at bedside on 06/21/20  previous MD, 06/22/20 by me over the phone.   Disposition Plan: Status is: Inpatient  Remains inpatient appropriate because:Inpatient level of care appropriate due to severity of illness   Dispo: The patient is from: Home              Anticipated d/c is to: TBD              Anticipated d/c date is: 2 days              Patient currently is not medically stable to d/c.   Barriers to Discharge: Continued altered mental status-not yet at baseline-IV steroid daily for 5 days  Antimicrobial agents: Anti-infectives (From admission, onward)   None       Time spent: 15- minutes-Greater than 50% of this time was spent in counseling, explanation of diagnosis, planning of further management, and coordination of care.  MEDICATIONS: Scheduled Meds: . amLODipine  5 mg Oral Daily  . aspirin EC  325 mg Oral Daily  . atorvastatin  40 mg Oral q1800  . Chlorhexidine Gluconate Cloth  6 each Topical Daily  . enoxaparin (LOVENOX) injection  40 mg Subcutaneous Q24H  . insulin aspart  0-20 Units Subcutaneous TID WC  . insulin aspart  0-5 Units Subcutaneous QHS  . insulin glargine  10 Units Subcutaneous Daily  . multivitamin with minerals  1 tablet Oral Daily  . QUEtiapine  50 mg Oral QHS  . tamsulosin  0.4 mg Oral Daily   Continuous Infusions: . sodium chloride Stopped (06/19/20 0101)  . sodium chloride 50 mL/hr at 06/21/20 1946  . methylPREDNISolone (SOLU-MEDROL) injection 1,000 mg (06/21/20 1048)   PRN Meds:.sodium chloride, acetaminophen **OR** acetaminophen, bisacodyl, diphenhydrAMINE, fentaNYL (SUBLIMAZE) injection, haloperidol lactate, labetalol, midazolam, ondansetron **OR** ondansetron (ZOFRAN) IV, sodium phosphate   PHYSICAL EXAM: Vital signs: Vitals:   06/21/20 1941 06/21/20 2357 06/22/20 0403 06/22/20 0741  BP: 123/79 122/80 140/86 (!) 141/86  Pulse: 93 75 83 89  Resp: 20 18  18   Temp: 98.2 F (36.8 C)  97.7 F (36.5 C) 98 F (36.7 C) 98.2 F (36.8 C)  TempSrc: Axillary Axillary Axillary   SpO2: 99% 98% 99% 97%  Weight:      Height:       Filed Weights   06/07/20 0840 06/20/20 0753  Weight: 63.5 kg 63.5 kg   Body mass index is 22.6 kg/m.   Gen Exam:   Awake moving all 4 extremities by herself, slightly slow to respond  Lillie.AT,PERRAL Supple Neck,No JVD, No cervical lymphadenopathy appriciated.  Symmetrical Chest wall movement, Good air movement bilaterally, CTAB RRR,No Gallops, Rubs or new Murmurs, No Parasternal Heave +ve B.Sounds, Abd Soft, No tenderness, No organomegaly appriciated, No rebound - guarding or rigidity. No Cyanosis, Clubbing or edema, No new Rash or bruise   I have personally reviewed following labs and imaging studies  LABORATORY DATA: CBC: Recent Labs  Lab 06/19/20 0322  WBC 12.3*  HGB 14.1  HCT 41.6  MCV 89.5  PLT 761    Basic Metabolic Panel: Recent Labs  Lab 06/19/20 0322  NA 138  K 3.8  CL 105  CO2 21*  GLUCOSE 205*  BUN 13  CREATININE 0.74  CALCIUM 9.7    GFR: Estimated Creatinine Clearance: 65.6 mL/min (by C-G formula based on SCr of 0.74 mg/dL).  Liver Function Tests: Recent Labs  Lab 06/19/20 0322  AST 35  ALT 20  ALKPHOS 63  BILITOT 0.7  PROT 7.2  ALBUMIN 3.4*   No  results for input(s): LIPASE, AMYLASE in the last 168 hours. No results for input(s): AMMONIA in the last 168 hours.  Coagulation Profile: No results for input(s): INR, PROTIME in the last 168 hours.  Cardiac Enzymes: No results for input(s): CKTOTAL, CKMB, CKMBINDEX, TROPONINI in the last 168 hours.  BNP (last 3 results) No results for input(s): PROBNP in the last 8760 hours.  Lipid Profile: No results for input(s): CHOL, HDL, LDLCALC, TRIG, CHOLHDL, LDLDIRECT in the last 72 hours.  Thyroid Function Tests: No results for input(s): TSH, T4TOTAL, FREET4, T3FREE, THYROIDAB in the last 72 hours.  Anemia Panel: No results for input(s):  VITAMINB12, FOLATE, FERRITIN, TIBC, IRON, RETICCTPCT in the last 72 hours.  Urine analysis:    Component Value Date/Time   COLORURINE STRAW (A) 06/08/2020 1349   APPEARANCEUR CLEAR 06/08/2020 1349   LABSPEC 1.015 06/08/2020 1349   PHURINE 8.0 06/08/2020 1349   GLUCOSEU 100 (A) 06/08/2020 1349   HGBUR NEGATIVE 06/08/2020 1349   BILIRUBINUR NEGATIVE 06/08/2020 1349   KETONESUR 15 (A) 06/08/2020 1349   PROTEINUR NEGATIVE 06/08/2020 1349   NITRITE NEGATIVE 06/08/2020 1349   LEUKOCYTESUR NEGATIVE 06/08/2020 1349    Sepsis Labs: Lactic Acid, Venous No results found for: LATICACIDVEN  MICROBIOLOGY: Recent Results (from the past 240 hour(s))  CSF culture     Status: None   Collection Time: 06/17/20  7:08 PM   Specimen: CSF  Result Value Ref Range Status   Specimen Description CSF  Final   Special Requests NONE  Final   Gram Stain NO WBC SEEN NO ORGANISMS SEEN   Final   Culture   Final    NO GROWTH 3 DAYS Performed at [facility], [place] [address], [place], [place] [address]    Report Status 06/21/2020 FINAL  Final    RADIOLOGY STUDIES/RESULTS: MR ANGIO HEAD WO CONTRAST  Result Date: 06/20/2020 CLINICAL DATA:  Altered mental status EXAM: MRI HEAD WITHOUT AND WITH CONTRAST MRA HEAD WITHOUT CONTRAST MRA NECK WITHOUT AND WITH CONTRAST TECHNIQUE: Multiplanar, multiecho pulse sequences of the brain and surrounding structures were obtained without and with intravenous contrast. Angiographic images of the Circle of Willis were obtained using MRA technique without intravenous contrast. Angiographic images of the neck were obtained using MRA technique without and with intravenous contrast. Carotid stenosis measurements (when applicable) are obtained utilizing NASCET criteria, using the distal internal carotid diameter as the denominator. CONTRAST:  6.26mL GADAVIST GADOBUTROL 1 MMOL/ML IV SOLN COMPARISON:  06/15/2019 and 03/30/2019 FINDINGS: MRI HEAD Brain: As seen previously, there is a  subacute right parietal cortical/subcortical infarct. There is some associated enhancement. There is unchanged residual diffusion hyperintensity in the area of chronic left parietal infarct. Multiple additional chronic infarcts are again noted including involvement of the bilateral frontal, parietal, and occipital lobes as well as left greater than right cerebellum and right thalamus. There is susceptibility and T1 shortening associated with some infarcts reflecting chronic blood products and laminar necrosis. Stable calcified meningioma along the right anterior falx without underlying parenchymal edema. Additional areas of T2 hyperintensity in the supratentorial and pontine white matter likely reflect stable chronic microvascular ischemic changes. Ventricles are stable in size. Vascular: Major vessel flow voids at the skull base are preserved. Skull and upper cervical spine: Normal marrow signal is preserved. Sinuses/Orbits: Paranasal sinuses are aerated. Orbits are unremarkable. Other: Sella is unremarkable.  Mastoid air cells are clear. MRA HEAD Intracranial internal carotid arteries are patent. Middle and anterior cerebral arteries are patent. Intracranial vertebral arteries,  basilar artery, posterior cerebral arteries are patent. There is no significant stenosis or aneurysm. MRA NECK Common, internal, and external carotid arteries are patent. Extracranial vertebral arteries are patent and codominant. There is no measurable stenosis or evidence of dissection. IMPRESSION: Subacute right parietal cortical/subcortical infarct as noted previously. Numerous chronic infarcts. No large vessel occlusion, hemodynamically significant stenosis, or evidence of dissection. 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 Electronically Signed   By: Macy Mis M.D.   On: 06/20/2020 14:27   MR ANGIO NECK W WO CONTRAST  Result Date: 06/20/2020 CLINICAL DATA:  Altered mental status EXAM: MRI HEAD WITHOUT AND WITH  CONTRAST MRA HEAD WITHOUT CONTRAST MRA NECK WITHOUT AND WITH CONTRAST TECHNIQUE: Multiplanar, multiecho pulse sequences of the brain and surrounding structures were obtained without and with intravenous contrast. Angiographic images of the Circle of Willis were obtained using MRA technique without intravenous contrast. Angiographic images of the neck were obtained using MRA technique without and with intravenous contrast. Carotid stenosis measurements (when applicable) are obtained utilizing NASCET criteria, using the distal internal carotid diameter as the denominator. CONTRAST:  6.54mL GADAVIST GADOBUTROL 1 MMOL/ML IV SOLN COMPARISON:  06/15/2019 and 03/30/2019 FINDINGS: MRI HEAD Brain: As seen previously, there is a subacute right parietal cortical/subcortical infarct. There is some associated enhancement. There is unchanged residual diffusion hyperintensity in the area of chronic left parietal infarct. Multiple additional chronic infarcts are again noted including involvement of the bilateral frontal, parietal, and occipital lobes as well as left greater than right cerebellum and right thalamus. There is susceptibility and T1 shortening associated with some infarcts reflecting chronic blood products and laminar necrosis. Stable calcified meningioma along the right anterior falx without underlying parenchymal edema. Additional areas of T2 hyperintensity in the supratentorial and pontine white matter likely reflect stable chronic microvascular ischemic changes. Ventricles are stable in size. Vascular: Major vessel flow voids at the skull base are preserved. Skull and upper cervical spine: Normal marrow signal is preserved. Sinuses/Orbits: Paranasal sinuses are aerated. Orbits are unremarkable. Other: Sella is unremarkable.  Mastoid air cells are clear. MRA HEAD Intracranial internal carotid arteries are patent. Middle and anterior cerebral arteries are patent. Intracranial vertebral arteries, basilar artery,  posterior cerebral arteries are patent. There is no significant stenosis or aneurysm. MRA NECK Common, internal, and external carotid arteries are patent. Extracranial vertebral arteries are patent and codominant. There is no measurable stenosis or evidence of dissection. IMPRESSION: Subacute right parietal cortical/subcortical infarct as noted previously. Numerous chronic infarcts. No large vessel occlusion, hemodynamically significant stenosis, or evidence of dissection. 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 Electronically Signed   By: Macy Mis M.D.   On: 06/20/2020 14:27   MR BRAIN W WO CONTRAST  Result Date: 06/20/2020 CLINICAL DATA:  Altered mental status EXAM: MRI HEAD WITHOUT AND WITH CONTRAST MRA HEAD WITHOUT CONTRAST MRA NECK WITHOUT AND WITH CONTRAST TECHNIQUE: Multiplanar, multiecho pulse sequences of the brain and surrounding structures were obtained without and with intravenous contrast. Angiographic images of the Circle of Willis were obtained using MRA technique without intravenous contrast. Angiographic images of the neck were obtained using MRA technique without and with intravenous contrast. Carotid stenosis measurements (when applicable) are obtained utilizing NASCET criteria, using the distal internal carotid diameter as the denominator. CONTRAST:  6.60mL GADAVIST GADOBUTROL 1 MMOL/ML IV SOLN COMPARISON:  06/15/2019 and 03/30/2019 FINDINGS: MRI HEAD Brain: As seen previously, there is a subacute right parietal cortical/subcortical infarct. There is some associated enhancement. There is unchanged residual diffusion hyperintensity in the area  of chronic left parietal infarct. Multiple additional chronic infarcts are again noted including involvement of the bilateral frontal, parietal, and occipital lobes as well as left greater than right cerebellum and right thalamus. There is susceptibility and T1 shortening associated with some infarcts reflecting chronic blood products  and laminar necrosis. Stable calcified meningioma along the right anterior falx without underlying parenchymal edema. Additional areas of T2 hyperintensity in the supratentorial and pontine white matter likely reflect stable chronic microvascular ischemic changes. Ventricles are stable in size. Vascular: Major vessel flow voids at the skull base are preserved. Skull and upper cervical spine: Normal marrow signal is preserved. Sinuses/Orbits: Paranasal sinuses are aerated. Orbits are unremarkable. Other: Sella is unremarkable.  Mastoid air cells are clear. MRA HEAD Intracranial internal carotid arteries are patent. Middle and anterior cerebral arteries are patent. Intracranial vertebral arteries, basilar artery, posterior cerebral arteries are patent. There is no significant stenosis or aneurysm. MRA NECK Common, internal, and external carotid arteries are patent. Extracranial vertebral arteries are patent and codominant. There is no measurable stenosis or evidence of dissection. IMPRESSION: Subacute right parietal cortical/subcortical infarct as noted previously. Numerous chronic infarcts. No large vessel occlusion, hemodynamically significant stenosis, or evidence of dissection. 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 Electronically Signed   By: Macy Mis M.D.   On: 06/20/2020 14:27     LOS: 14 days   Signature  Lala Lund M.D on 06/22/2020 at 9:18 AM   -  To page go to [email or web address]

## 2020-06-22 NOTE — Progress Notes (Signed)
STROKE TEAM PROGRESS NOTE   INTERVAL HISTORY No improvement in overall condition and neurological function but also no worsening.  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  She has had recurrent large vessel cortical infarcts.  OBJECTIVE Vitals:   06/21/20 2357 06/22/20 0403 06/22/20 0741 06/22/20 1203  BP: 122/80 140/86 (!) 141/86 135/80  Pulse: 75 83 89 90  Resp: 18  18 18   Temp: 97.7 F (36.5 C) 98 F (36.7 C) 98.2 F (36.8 C) 98.1 F (36.7 C)  TempSrc: Axillary Axillary    SpO2: 98% 99% 97% 98%  Weight:      Height:        CBC:  Recent Labs  Lab 06/19/20 0322  WBC 12.3*  HGB 14.1  HCT 41.6  MCV 89.5  PLT 803    Basic Metabolic Panel:  Recent Labs  Lab 06/19/20 0322  NA 138  K 3.8  CL 105  CO2 21*  GLUCOSE 205*  BUN 13  CREATININE 0.74  CALCIUM 9.7    Lipid Panel:     Component Value Date/Time   CHOL 149 03/30/2020 0531   TRIG 82 03/30/2020 0531   HDL 38 (L) 03/30/2020 0531   CHOLHDL 3.9 03/30/2020 0531   VLDL 16 03/30/2020 0531   LDLCALC 95 03/30/2020 0531   HgbA1c:  Lab Results  Component Value Date   HGBA1C 6.9 (H) 06/09/2020   Urine Drug Screen:     Component Value Date/Time   LABOPIA NONE DETECTED 06/08/2020 1349   COCAINSCRNUR NONE DETECTED 06/08/2020 1349   LABBENZ POSITIVE (A) 06/08/2020 1349   AMPHETMU NONE DETECTED 06/08/2020 1349   THCU NONE DETECTED 06/08/2020 1349   LABBARB NONE DETECTED 06/08/2020 1349    Alcohol Level No results found for: [place]  MR BRAIN WO CONTRAST 06/14/2020 IMPRESSION:  1. Motion degraded, incomplete examination.  2. Subacute right parietal infarct.  3. No definite acute infarct identified.  4. Multiple chronic infarcts as above.   TEE 04/01/2020 IMPRESSIONS  1. Left ventricular ejection fraction, by estimation, is 60 to 65%. The  left ventricle has normal function. The left ventricle has no regional  wall motion abnormalities.  2.  Right ventricular systolic function is normal. The right ventricular  size is normal.  3. Left atrial size was moderately dilated. No left atrial/left atrial  appendage thrombus was detected.  4. The mitral valve is normal in structure. Mild mitral valve  regurgitation. No evidence of mitral stenosis.  5. Tricuspid valve regurgitation is mild to moderate.  6. The aortic valve is tricuspid. Aortic valve regurgitation is moderate.  Mild to moderate aortic valve sclerosis/calcification is present, without  any evidence of aortic stenosis.  7. The inferior vena cava is normal in size with greater than 50%  respiratory variability, suggesting right atrial pressure of 3 mmHg.  8. Agitated saline contrast bubble study was negative, with no evidence  of any interatrial shunt.   ECG - SR rate 84 BPM. (See cardiology reading for complete details)  PHYSICAL EXAM  Temp:  [97.7 F (36.5 C)-98.2 F (36.8 C)] 98.1 F (36.7 C) (11/21 1203) Pulse Rate:  [75-96] 90 (11/21 1203) Resp:  [18-21] 18 (11/21 1203) BP: (122-141)/(79-86) 135/80 (11/21 1203) SpO2:  [97 %-99 %] 98 % (11/21 1203)  General - Well nourished, well developed, restless, altered.  Ophthalmologic - fundi not visualized due to noncooperation.  Cardiovascular - Regular rhythm and rate.  Neuro -   she is awake  and more responsive today.  She does follow midline and appendicular commands although there is some challenge as there is seem to be a language impairment.  She is essentially mute today.  Eyes midposition, no gaze preference gaze preference, not blinking to visual threat bilaterally. No facial asymmetry, moving all extremities strong equally. Sensation, coordination and gait not tested. Positive grip reflex.    ASSESSMENT/PLAN [name] is a 64 y.o. female with history of HTN, HLD, DM, recent stroke with complete workup 03/2020 (TEE 04/01/20) returns with imaging showing new subacute stroke. She did not  receive IV t-PA due to recent stroke.  Rapid progressive dementia  AMS with behavior change, gradually worsening for the last 3 weeks. 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  MRI new subacute right parietal infarct since 03/2020. 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 Multiple other chronic infarct including left PCA, b/l MCA/PCA and left cerebellar. 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  MRI with and without contrast - Subacute right parietal cortical/subcortical infarct as noted previously. Numerous chronic infarcts.  MRA head and neck - unremarkable  May consider cerebral angiogram or brain biopsy to rule out CNS vasculitis  LTM EEG no Sz  EEG 11/7 diffuse encephalopathy   B1, B12, ammonia, TSH, FT4, HIV, RPR all neg  Thyroid antibodies neg  CSF culture - No growth x 3 days  Pan CT no malignancy  LP WBC 0-1, protein 69 and glucose 99  Paraneoplastic panel pending  14-3-3 and RT QuIC pending  On empiric treatment with solumedrol 1g x 5 days  Will let Dr. Leonie Man see her Monday to compare with the condition in 03/2020  Stroke: Subacute right parietal infarct - embolic - source unknown.  MRI head 11/13 - Subacute right parietal infarct. No definite acute infarct identified. Multiple chronic infarcts as above.   MRI with and without contrast 11/18 - Subacute right parietal cortical/subcortical infarct as noted previously. Numerous chronic infarcts.  MRA head and neck - unremarkable  TEE - 04/01/20 - EF 60 - 65%. No cardiac source of emboli identified.   [employer] Virus 2 - negative  HgbA1c - 6.9  UDS - benzodiazepine  VTE prophylaxis - Lovenox  aspirin 81 mg daily and clopidogrel 75 mg daily prior to admission, now on aspirin 325 mg daily. No DAPT in case of further procedure.  Ongoing aggressive stroke risk factor management  Therapy  recommendations: SNF  Disposition:  Pending  Hx of stroke  03/2020 admitted for aphasia and confusion.  CT and MRI showed left frontal parietal infarcts, chronic left frontal, bilateral occipital, left cerebellum infarcts.  MRA unremarkable.  Carotid Doppler negative.  TTE unremarkable.  LDL 94, A1c 6.8.  TEE no source of emboli.  Loop recorder not placed due to no insurance.  Discharged on DAPT and Lipitor 40. Per note, pt on discharge, still had "Receptive and expressive aphasia.Marland KitchenMarland KitchenNot following verbal commands... Nonverbal making unrecognizable sounds and grunts."  Hx of stroke 5-7 years ago, no details  Seizure ruled out  06/07/2020, episode of left arm twitching, altered mental status, decreased response and roving eyes.    CT 11/6 no acute abnormality.    EEG 11/7 negative for seizure  Currently LTM EEG no Sz so far  D/c LTM EEG  Hypertension  Home BP meds: amlodipine ; lisinopril  Current BP meds: amlodipine  Stable . Long-term BP  goal normotensive  Hyperlipidemia  Home Lipid lowering medication: Lipitor 40 mg daily   LDL 94  (03/2020), goal < 70  Current lipid lowering medication: Lipitor 40 mg daily   Continue statin at discharge  Diabetes  Home diabetic meds: metformin  Current diabetic meds: SSI   HgbA1c 6.9, goal < 7.0  CBG monitoring  Other Stroke Risk Factors  Advanced age  Family hx stroke (brother and sister)   Other Active Problems  Code status - Full code  Psych consult 11/9 -> Seroquel and citalopram recommended   Check labs Monday  Hospital day # 14  Dr. Merlene Laughter -- The patient was seen and examined by me; notes, chart and tests reviewed and discussed with midlevel provider, other providers, patient, and family.      To contact Stroke Continuity provider, please refer to [email or web address] After hours, contact General Neurology

## 2020-06-22 NOTE — Anesthesia Postprocedure Evaluation (Addendum)
Anesthesia Post Note  Patient: Sonya George  Procedure(s) Performed: MRI WITH ANESTHESIA (N/A )     Patient location during evaluation: PACU Anesthesia Type: General Level of consciousness: awake and confused Pain management: pain level controlled Vital Signs Assessment: post-procedure vital signs reviewed and stable Respiratory status: spontaneous breathing, nonlabored ventilation, respiratory function stable and patient connected to nasal cannula oxygen Cardiovascular status: blood pressure returned to baseline and stable Postop Assessment: no apparent nausea or vomiting Anesthetic complications: no   No complications documented.  Last Vitals:  Vitals:   06/22/20 0403 06/22/20 0741  BP: 140/86 (!) 141/86  Pulse: 83 89  Resp:  18  Temp: 36.7 C 36.8 C  SpO2: 99% 97%    Last Pain:  Vitals:   06/22/20 0403  TempSrc: Axillary  PainSc:                  Tacha Manni

## 2020-06-23 ENCOUNTER — Encounter (HOSPITAL_COMMUNITY): Payer: Self-pay | Admitting: Radiology

## 2020-06-23 LAB — GLUCOSE, CAPILLARY
Glucose-Capillary: 171 mg/dL — ABNORMAL HIGH (ref 70–99)
Glucose-Capillary: 176 mg/dL — ABNORMAL HIGH (ref 70–99)
Glucose-Capillary: 109 mg/dL — ABNORMAL HIGH (ref 70–99)
Glucose-Capillary: 152 mg/dL — ABNORMAL HIGH (ref 70–99)

## 2020-06-23 LAB — BASIC METABOLIC PANEL
Anion gap: 9 (ref 5–15)
BUN: 19 mg/dL (ref 8–23)
CO2: 26 mmol/L (ref 22–32)
Calcium: 9.2 mg/dL (ref 8.9–10.3)
Chloride: 104 mmol/L (ref 98–111)
Creatinine, Ser: 0.61 mg/dL (ref 0.44–1.00)
GFR, Estimated: >60 mL/min (ref >60)
Glucose, Bld: 165 mg/dL — ABNORMAL HIGH (ref 70–99)
Potassium: 4 mmol/L (ref 3.5–5.1)
Sodium: 139 mmol/L (ref 135–145)

## 2020-06-23 LAB — CBC
HCT: 38.8 % (ref 36.0–46.0)
Hemoglobin: 12.9 g/dL (ref 12.0–15.0)
MCH: 30 pg (ref 26.0–34.0)
MCHC: 33.2 g/dL (ref 30.0–36.0)
MCV: 90.2 fL (ref 80.0–100.0)
Platelets: 284 10*3/uL (ref 150–400)
RBC: 4.3 MIL/uL (ref 3.87–5.11)
RDW: 13.8 % (ref 11.5–15.5)
WBC: 10.4 10*3/uL (ref 4.0–10.5)
nRBC: 0 % (ref 0.0–0.2)

## 2020-06-23 MED ORDER — HALOPERIDOL LACTATE 5 MG/ML IJ SOLN: 2.0000 mg | Freq: Four times a day (QID) | INTRAMUSCULAR | Status: DC | PRN

## 2020-06-23 NOTE — Progress Notes (Signed)
Physical Therapy Treatment Patient Details Name: Sonya George MRN: [id number] DOB: 14-Apr-1956 Today's Date: 06/23/2020    History of Present Illness 64 y.o. female admitted with AMS with difficulty communicating. 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 pt noncompliant spits out medications PMH CVA HTN DM seizures    PT Comments    Pt sitting up in long sitting in bed. Required mod A for LE's to come to EOB and visual cues needed to bend knees for feet to be placed on floor, She followed visual cues the first time but soon extended knees again and did not follow cues to bend. Mod A +2 for sit<>stand but pt with B knees flexed and required max A +2 to step to chair. Could not advance further ambulation due to ineffective stepping and decreased safety.  Pt left in recliner with posey chair alarm belt. Decreasing frequency to 2x/wk due to SNF recommendation and slow progress. PT will continue to follow.   Follow Up Recommendations  SNF;Supervision/Assistance - 24 hour     Equipment Recommendations  Wheelchair (measurements PT);Wheelchair cushion (measurements PT);Hospital bed;3in1 (PT);Rolling walker with 5" wheels    Recommendations for Other Services       Precautions / Restrictions Precautions Precautions: Fall;Other (comment) Precaution Comments: tele safety sitter; mitts Restrictions Weight Bearing Restrictions: No    Mobility  Bed Mobility Overal bed mobility: Needs Assistance Bed Mobility: Supine to Sit     Supine to sit: Mod assist     General bed mobility comments: pt sat straight up into long sitting but needed mod A to facilitate LE's off EOB and then held knees extended straight out from bed. Was able to get her to bend then and place feet on the floor once by stomping on floor but this did not work subsequent times that she extended them  Transfers Overall transfer level: Needs assistance Equipment used: 2 person hand held assist Transfers: Sit  to/from [facility] Sit to Stand: +2 physical assistance;Mod assist Stand pivot transfers: Max assist;+2 physical assistance       General transfer comment: pt initiated transfer even before gait belt applied. However, when allowed to stand, initiated well but then needed mod A to come up to standing and B knees remained flexed.   Ambulation/Gait Ambulation/Gait assistance: +2 physical assistance;Max assist (deferred 2/2 falls risk) Gait Distance (Feet): 2 Feet Assistive device: 2 person hand held assist Gait Pattern/deviations: Narrow base of support;Step-to pattern Gait velocity: decreased   General Gait Details: took pivot steps to chair with max A +2 but steps were very small and still needed facilitation at hips to get trunk all the way to chair before sitting   Stairs             Wheelchair Mobility    Modified Rankin (Stroke Patients Only)       Balance Overall balance assessment: Needs assistance Sitting-balance support: Bilateral upper extremity supported;Feet supported Sitting balance-Leahy Scale: Poor Sitting balance - Comments: requires at least min guard d/t impulsivity Postural control: Posterior lean Standing balance support: Bilateral upper extremity supported Standing balance-Leahy Scale: Poor Standing balance comment: MaxAx2 to maintain static standing balance with bilat UE support due to pt's strong resistance with posterior push.                            Cognition Arousal/Alertness: Awake/alert Behavior During Therapy: Restless;Impulsive Overall Cognitive Status: Difficult to assess Area of Impairment: Attention;Following commands;Safety/judgement;Awareness  Current Attention Level: Focused   Following Commands: Follows one step commands inconsistently;Follows one step commands with increased time Safety/Judgement: Decreased awareness of safety;Decreased awareness of deficits Awareness:  Intellectual   General Comments: pt followed 2 visual commands throughout session, otherwise did not follow      Exercises      General Comments General comments (skin integrity, edema, etc.): while positioning pt in chair with posey belt, pt pulled LUE PIV out before mitts could be replaced. Randall Hiss, RN notified. Mitts left off end of session because pt with no more lines to remove      Pertinent Vitals/Pain Pain Assessment: Faces Faces Pain Scale: No hurt Pain Location: no signs or compliants of pain with session    Home Living                      Prior Function            PT Goals (current goals can now be found in the care plan section) Acute Rehab PT Goals Patient Stated Goal: Unable to state PT Goal Formulation: Patient unable to participate in goal setting Time For Goal Achievement: 07/07/20 Potential to Achieve Goals: Fair Progress towards PT goals: Not progressing toward goals - comment (not taking effective steps to progress ambulation)    Frequency    Min 2X/week      PT Plan Current plan remains appropriate;Frequency needs to be updated    Co-evaluation              AM-PAC PT "6 Clicks" Mobility   Outcome Measure  Help needed turning from your back to your side while in a flat bed without using bedrails?: A Lot Help needed moving from lying on your back to sitting on the side of a flat bed without using bedrails?: A Lot Help needed moving to and from a bed to a chair (including a wheelchair)?: A Lot Help needed standing up from a chair using your arms (e.g., wheelchair or bedside chair)?: A Lot Help needed to walk in hospital room?: Total Help needed climbing 3-5 steps with a railing? : Total 6 Click Score: 10    End of Session Equipment Utilized During Treatment: Gait belt Activity Tolerance: Patient tolerated treatment well Patient left: in chair;with call bell/phone within reach;with chair alarm set (posey belt chair alarm) Nurse  Communication: Mobility status PT Visit Diagnosis: Muscle weakness (generalized) (M62.81);Difficulty in walking, not elsewhere classified (R26.2);Other abnormalities of gait and mobility (R26.89);Unsteadiness on feet (R26.81)     Time: 4431-5400 PT Time Calculation (min) (ACUTE ONLY): 31 min  Charges:  $Therapeutic Activity: 23-37 mins                     Leighton Roach, [place]  Pager [phone] Office [place] 06/23/2020, 1:01 PM

## 2020-06-23 NOTE — Progress Notes (Signed)
PROGRESS NOTE        PATIENT DETAILS Name: Sonya George Age: 64 y.o. Sex: female Date of Birth: 09-15-1955 Admit Date: 06/07/2020 Admitting Physician Reubin Milan, MD DUK:GURKYH, Delfino Lovett, NP  Brief Narrative: Patient is a 64 y.o. female with past medical history of HTN, DM, prior CVA-who presented with altered mental status after getting news of her sister's passing.  She was admitted for further work-up-extensive neuroimaging/EEG negative-evaluated by both neurology and psychiatry-initially thought to have functional/stress-induced etiology for altered mental status-however due to continued speech abnormalities-rapidly fluctuating/progressive mental status changes-neurology reconsulted-LP performed-further work-up underway-see below.  Significant events: 8/28-8/31>> admit to [facility] for work-up of CVA 11/6>> admit to [facility] for confusion 11/17>> urinary retention-Foley inserted 11/17>> IV steroids 1 g daily x5 days started.  Significant studies: 11/6>> CT head: No acute intracranial abnormality. 11/6>> chest x-ray: No active pulmonary disease. 11/6>> HIV nonreactive 11/7>> vitamin B12 and vitamin B1: Within normal limits 11/7>> RPR: Nonreactive 11/7>> TSH: Within normal limits 11/8>> A1c 6.9 11/7>> vitamin B12: 561 11/13>>MRI Brain:Subacute right parietal infarct. 11/15>> CT chest/abdomen/pelvis: Large stool burden, distended bladder-no acute abnormality in the chest, abdomen or pelvis. 11/16>> CSF VDRL nonreactive, CSF oligoclonal band: Not detected 11/16>> CSF HSV PCR, autoimmune antibody, [place] Nasuti panel: Pending 11/19>> MRI brain: Subacute right parietal cortical/subcortical infarct 11/19>> MRA head/neck: No large vessel occlusion  Antimicrobial therapy: None  Microbiology data: 11/16>> CSF culture: Negative so far  Procedures : 11/7>>spot EEG: Mild diffuse encephalopathy-nonspecific etiology-no seizures. 11/15-11/16>> LTM EEG: No  seizures 11/16>> LP by neurology  Consults: Neurology, psychiatry  DVT Prophylaxis : Lovenox   Subjective:  Patient laying in bed, slightly more confused this morning, moving all 4 extremities by herself and appears to be in no distress.  Assessment/Plan:  Acute encephalopathy: Her mental status is somewhat fluctuating from day today continues to have expressive aphasia, had detailed discussion with patient's daughter on 06/23/2020 it appears that patient for several months has been getting forgetful.  Question if this is progressive dementia.  Full neurological work-up underway with neurology following the patient closely.  CSF so far suggestive of slightly increased protein levels, currently on IV steroids 5 doses per neurology.  Defer further management to neurology.CSF HSV PCR, autoimmune antibody, [place] Nasuti panel: Pending  Subacute CVA found as below. Reduce Benzodiazepine use and use as needed Haldol if needed.  Subacute CVA-recent hospitalization for CVA: Unlikely causing her confusion/AMS-continue aspirin/statin.  Had an extensive stroke work-up during her most recent hospitalization.  Acute urinary retention: Foley Flomax, trial of removing Foley catheter on 06/23/2020.  HLD: Continue statin  HTN: BP stable-continue amlodipine-lisinopril remains on hold.   DM-2 (A1c 6.9 on 11/8) with uncontrolled hyperglycemia due to steroids: CBGs on the higher side today-due to steroids-continued 10 units of Lantus and SSI-follow and adjust.    Recent Labs    06/22/20 1740 06/22/20 2142 06/23/20 0607  GLUCAP 204* 143* 171*    Diet: Diet Order            Diet heart healthy/carb modified Room service appropriate? No; Fluid consistency: Thin  Diet effective now                  Code Status: Full code   Family Communication: Daughter [name] [phone]) at bedside on 06/21/20 previous MD, 06/22/20, 06/23/20 by me over the phone.   Disposition Plan: Status is:  Inpatient  Remains inpatient appropriate because:Inpatient level of care appropriate due to severity of illness   Dispo: The patient is from: Home              Anticipated d/c is to: TBD              Anticipated d/c date is: 2 days              Patient currently is not medically stable to d/c.   Barriers to Discharge: Continued altered mental status-not yet at baseline-IV steroid daily for 5 days  Antimicrobial agents: Anti-infectives (From admission, onward)   None       Time spent: 15- minutes-Greater than 50% of this time was spent in counseling, explanation of diagnosis, planning of further management, and coordination of care.  MEDICATIONS: Scheduled Meds: . amLODipine  5 mg Oral Daily  . aspirin EC  325 mg Oral Daily  . atorvastatin  40 mg Oral q1800  . Chlorhexidine Gluconate Cloth  6 each Topical Daily  . enoxaparin (LOVENOX) injection  40 mg Subcutaneous Q24H  . insulin aspart  0-20 Units Subcutaneous TID WC  . insulin aspart  0-5 Units Subcutaneous QHS  . insulin glargine  10 Units Subcutaneous Daily  . multivitamin with minerals  1 tablet Oral Daily  . QUEtiapine  50 mg Oral QHS  . tamsulosin  0.4 mg Oral Daily   Continuous Infusions: . sodium chloride Stopped (06/19/20 0101)  . sodium chloride 50 mL/hr at 06/21/20 1946   PRN Meds:.sodium chloride, acetaminophen **OR** acetaminophen, bisacodyl, diphenhydrAMINE, fentaNYL (SUBLIMAZE) injection, haloperidol lactate, labetalol, midazolam, ondansetron **OR** ondansetron (ZOFRAN) IV, sodium phosphate   PHYSICAL EXAM: Vital signs: Vitals:   06/22/20 2007 06/23/20 0037 06/23/20 0306 06/23/20 0906  BP: (!) 127/97 (!) 142/76 137/84 134/89  Pulse: 75 77 82 100  Resp: 18 19 18    Temp: 98.1 F (36.7 C) 98.2 F (36.8 C) 98.1 F (36.7 C) 98 F (36.7 C)  TempSrc: Axillary Axillary Axillary Oral  SpO2: 97% 94% 95% 99%  Weight:      Height:       Filed Weights   06/07/20 0840 06/20/20 0753  Weight: 63.5 kg 63.5  kg   Body mass index is 22.6 kg/m.   Gen Exam:   Awake slightly more confused early in the morning, unable to follow commands or answer questions appropriately [place].AT,PERRAL Supple Neck,No JVD, No cervical lymphadenopathy appriciated.  Symmetrical Chest wall movement, Good air movement bilaterally, CTAB RRR,No Gallops, Rubs or new Murmurs, No Parasternal Heave +ve B.Sounds, Abd Soft, No tenderness, No organomegaly appriciated, No rebound - guarding or rigidity. No Cyanosis, Clubbing or edema, No new Rash or bruise    I have personally reviewed following labs and imaging studies  LABORATORY DATA: CBC: Recent Labs  Lab 06/19/20 0322 06/23/20 0223  WBC 12.3* 10.4  HGB 14.1 12.9  HCT 41.6 38.8  MCV 89.5 90.2  PLT 304 096    Basic Metabolic Panel: Recent Labs  Lab 06/19/20 0322 06/23/20 0223  NA 138 139  K 3.8 4.0  CL 105 104  CO2 21* 26  GLUCOSE 205* 165*  BUN 13 19  CREATININE 0.74 0.61  CALCIUM 9.7 9.2    GFR: Estimated Creatinine Clearance: 65.6 mL/min (by C-G formula based on SCr of 0.61 mg/dL).  Liver Function Tests: Recent Labs  Lab 06/19/20 0322  AST 35  ALT 20  ALKPHOS 63  BILITOT 0.7  PROT 7.2  ALBUMIN 3.4*   No  results for input(s): LIPASE, AMYLASE in the last 168 hours. No results for input(s): AMMONIA in the last 168 hours.  Coagulation Profile: No results for input(s): INR, PROTIME in the last 168 hours.  Cardiac Enzymes: No results for input(s): CKTOTAL, CKMB, CKMBINDEX, TROPONINI in the last 168 hours.  BNP (last 3 results) No results for input(s): PROBNP in the last 8760 hours.  Lipid Profile: No results for input(s): CHOL, HDL, LDLCALC, TRIG, CHOLHDL, LDLDIRECT in the last 72 hours.  Thyroid Function Tests: No results for input(s): TSH, T4TOTAL, FREET4, T3FREE, THYROIDAB in the last 72 hours.  Anemia Panel: No results for input(s): VITAMINB12, FOLATE, FERRITIN, TIBC, IRON, RETICCTPCT in the last 72 hours.  Urine analysis:     Component Value Date/Time   COLORURINE STRAW (A) 06/08/2020 1349   APPEARANCEUR CLEAR 06/08/2020 1349   LABSPEC 1.015 06/08/2020 1349   PHURINE 8.0 06/08/2020 1349   GLUCOSEU 100 (A) 06/08/2020 1349   HGBUR NEGATIVE 06/08/2020 1349   BILIRUBINUR NEGATIVE 06/08/2020 1349   KETONESUR 15 (A) 06/08/2020 1349   PROTEINUR NEGATIVE 06/08/2020 1349   NITRITE NEGATIVE 06/08/2020 1349   LEUKOCYTESUR NEGATIVE 06/08/2020 1349    Sepsis Labs: Lactic Acid, Venous No results found for: LATICACIDVEN  MICROBIOLOGY: Recent Results (from the past 240 hour(s))  CSF culture     Status: None   Collection Time: 06/17/20  7:08 PM   Specimen: CSF  Result Value Ref Range Status   Specimen Description CSF  Final   Special Requests NONE  Final   Gram Stain NO WBC SEEN NO ORGANISMS SEEN   Final   Culture   Final    NO GROWTH 3 DAYS Performed at [facility], [place] [address] [place], [place] [address]    Report Status 06/21/2020 FINAL  Final    RADIOLOGY STUDIES/RESULTS: No results found.   LOS: 15 days   Signature  Lala Lund M.D on 06/23/2020 at 9:28 AM   -  To page go to [email or web address]

## 2020-06-23 NOTE — Progress Notes (Signed)
STROKE TEAM PROGRESS NOTE   INTERVAL HISTORY Patient is sitting up in bed wide-awake.  She is mostly nonverbal and can barely follow occasional midline commands.  Overall there is no improvement in overall condition and neurological function but also no worsening.  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  She has had recurrent large vessel cortical infarcts.  Vital signs are stable.  OBJECTIVE Vitals:   06/23/20 0037 06/23/20 0306 06/23/20 0906 06/23/20 1218  BP: (!) 142/76 137/84 134/89 130/74  Pulse: 77 82 100 83  Resp: 19 18 18 18   Temp: 98.2 F (36.8 C) 98.1 F (36.7 C) 98 F (36.7 C) 98.3 F (36.8 C)  TempSrc: Axillary Axillary Oral Oral  SpO2: 94% 95% 99% 99%  Weight:      Height:        CBC:  Recent Labs  Lab 06/19/20 0322 06/23/20 0223  WBC 12.3* 10.4  HGB 14.1 12.9  HCT 41.6 38.8  MCV 89.5 90.2  PLT 304 201    Basic Metabolic Panel:  Recent Labs  Lab 06/19/20 0322 06/23/20 0223  NA 138 139  K 3.8 4.0  CL 105 104  CO2 21* 26  GLUCOSE 205* 165*  BUN 13 19  CREATININE 0.74 0.61  CALCIUM 9.7 9.2    Lipid Panel:     Component Value Date/Time   CHOL 149 03/30/2020 0531   TRIG 82 03/30/2020 0531   HDL 38 (L) 03/30/2020 0531   CHOLHDL 3.9 03/30/2020 0531   VLDL 16 03/30/2020 0531   LDLCALC 95 03/30/2020 0531   HgbA1c:  Lab Results  Component Value Date   HGBA1C 6.9 (H) 06/09/2020   Urine Drug Screen:     Component Value Date/Time   LABOPIA NONE DETECTED 06/08/2020 1349   COCAINSCRNUR NONE DETECTED 06/08/2020 1349   LABBENZ POSITIVE (A) 06/08/2020 1349   AMPHETMU NONE DETECTED 06/08/2020 1349   THCU NONE DETECTED 06/08/2020 1349   LABBARB NONE DETECTED 06/08/2020 1349    Alcohol Level No results found for: [place]  MR BRAIN WO CONTRAST 06/14/2020 IMPRESSION:  1. Motion degraded, incomplete examination.  2. Subacute right parietal infarct.  3. No definite acute infarct identified.   4. Multiple chronic infarcts as above.   TEE 04/01/2020 IMPRESSIONS  1. Left ventricular ejection fraction, by estimation, is 60 to 65%. The  left ventricle has normal function. The left ventricle has no regional  wall motion abnormalities.  2. Right ventricular systolic function is normal. The right ventricular  size is normal.  3. Left atrial size was moderately dilated. No left atrial/left atrial  appendage thrombus was detected.  4. The mitral valve is normal in structure. Mild mitral valve  regurgitation. No evidence of mitral stenosis.  5. Tricuspid valve regurgitation is mild to moderate.  6. The aortic valve is tricuspid. Aortic valve regurgitation is moderate.  Mild to moderate aortic valve sclerosis/calcification is present, without  any evidence of aortic stenosis.  7. The inferior vena cava is normal in size with greater than 50%  respiratory variability, suggesting right atrial pressure of 3 mmHg.  8. Agitated saline contrast bubble study was negative, with no evidence  of any interatrial shunt.   ECG - SR rate 84 BPM. (See cardiology reading for complete details)  PHYSICAL EXAM  Temp:  [98 F (36.7 C)-98.3 F (36.8 C)] 98.3 F (36.8 C) (11/22 1218) Pulse Rate:  [75-100] 83 (11/22 1218) Resp:  [18-19] 18 (11/22 1218) BP: (127-142)/(74-97)  130/74 (11/22 1218) SpO2:  [94 %-99 %] 99 % (11/22 1218)  General - Well nourished, well developed, elderly African lady  Ophthalmologic - fundi not visualized due to noncooperation.  Cardiovascular - Regular rhythm and rate.  Neuro -   she is awake and alert.  She does follow midline and appendicular commands although there is some challenge as there is seem to be a language impairment.  She is essentially mute and nonverbal    Eyes midposition, no gaze preference gaze preference, not blinking to visual threat bilaterally. No facial asymmetry, moving all extremities strong equally. Sensation, coordination and gait not  tested. Positive grip reflex.    ASSESSMENT/PLAN Ms. Mariavictoria Nottingham is a 64 y.o. female with history of HTN, HLD, DM, recent stroke with complete workup 03/2020 (TEE 04/01/20) returns with imaging showing new subacute stroke. She did not receive IV t-PA due to recent stroke.  Rapid progressive dementia  AMS with behavior change, gradually worsening for the last 3 weeks. 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  MRI new subacute right parietal infarct since 03/2020. 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 Multiple other chronic infarct including left PCA, b/l MCA/PCA and left cerebellar. 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  MRI with and without contrast - Subacute right parietal cortical/subcortical infarct as noted previously. Numerous chronic infarcts.  MRA head and neck - unremarkable  May consider cerebral angiogram or brain biopsy to rule out CNS vasculitis  LTM EEG no Sz  EEG 11/7 diffuse encephalopathy   B1, B12, ammonia, TSH, FT4, HIV, RPR all neg  Thyroid antibodies neg  CSF culture - No growth x 3 days  Pan CT no malignancy  LP WBC 0-1, protein 69 and glucose 99  Paraneoplastic panel pending  14-3-3 and RT QuIC pending  On empiric treatment with solumedrol 1g x 5 days  Will let Dr. Leonie Man see her Monday to compare with the condition in 03/2020  Stroke: Subacute right parietal infarct - embolic - source unknown.  MRI head 11/13 - Subacute right parietal infarct. No definite acute infarct identified. Multiple chronic infarcts as above.   MRI with and without contrast 11/18 - Subacute right parietal cortical/subcortical infarct as noted previously. Numerous chronic infarcts.  MRA head and neck - unremarkable  TEE - 04/01/20 - EF 60 - 65%. No cardiac source of emboli identified.   [employer] Virus 2 - negative  HgbA1c - 6.9  UDS -  benzodiazepine  VTE prophylaxis - Lovenox  aspirin 81 mg daily and clopidogrel 75 mg daily prior to admission, now on aspirin 325 mg daily. No DAPT in case of further procedure.  Ongoing aggressive stroke risk factor management  Therapy recommendations: SNF  Disposition:  Pending  Hx of stroke  03/2020 admitted for aphasia and confusion.  CT and MRI showed left frontal parietal infarcts, chronic left frontal, bilateral occipital, left cerebellum infarcts.  MRA unremarkable.  Carotid Doppler negative.  TTE unremarkable.  LDL 94, A1c 6.8.  TEE no source of emboli.  Loop recorder not placed due to no insurance.  Discharged on DAPT and Lipitor 40. Per note, pt on discharge, still had "Receptive and expressive aphasia.Marland KitchenMarland KitchenNot following verbal commands... Nonverbal making unrecognizable sounds and grunts."  Hx of stroke 5-7 years ago, no details  Seizure ruled out  06/07/2020, episode of left arm twitching, altered mental status, decreased response and roving eyes.  CT 11/6 no acute abnormality.    EEG 11/7 negative for seizure  Currently LTM EEG no Sz so far  D/c LTM EEG  Hypertension  Home BP meds: amlodipine ; lisinopril  Current BP meds: amlodipine  Stable . Long-term BP goal normotensive  Hyperlipidemia  Home Lipid lowering medication: Lipitor 40 mg daily   LDL 94  (03/2020), goal < 70  Current lipid lowering medication: Lipitor 40 mg daily   Continue statin at discharge  Diabetes  Home diabetic meds: metformin  Current diabetic meds: SSI   HgbA1c 6.9, goal < 7.0  CBG monitoring  Other Stroke Risk Factors  Advanced age  Family hx stroke (brother and sister)   Other Active Problems  Code status - Full code  Psych consult 11/9 -> Seroquel and citalopram recommended   Check labs Monday  Hospital day # 15 No family available at the bedside.  Continue ongoing medical management.  Discussed with Dr. Candiss Norse.  Greater than 50% time during this 25-minute  visit was spent on counseling and coordination of care about her strokes and dementia and answering questions. Antony Contras, MD    To contact Stroke Continuity provider, please refer to [email or web address] After hours, contact General Neurology

## 2020-06-24 DIAGNOSIS — Z515 Encounter for palliative care: Secondary | ICD-10-CM

## 2020-06-24 DIAGNOSIS — Z7189 Other specified counseling: Secondary | ICD-10-CM

## 2020-06-24 LAB — GLUCOSE, CAPILLARY
Glucose-Capillary: 119 mg/dL — ABNORMAL HIGH (ref 70–99)
Glucose-Capillary: 120 mg/dL — ABNORMAL HIGH (ref 70–99)
Glucose-Capillary: 205 mg/dL — ABNORMAL HIGH (ref 70–99)
Glucose-Capillary: 70 mg/dL (ref 70–99)

## 2020-06-24 LAB — SARS CORONAVIRUS 2 BY RT PCR (HOSPITAL ORDER, PERFORMED IN ~~LOC~~ HOSPITAL LAB): SARS Coronavirus 2: NEGATIVE

## 2020-06-24 MED ORDER — GLUCERNA SHAKE PO LIQD
237.0000 mL | Freq: Three times a day (TID) | ORAL | Status: DC
  Administered 2020-06-25 (×2): 237 mL via ORAL

## 2020-06-24 NOTE — Progress Notes (Signed)
STROKE TEAM PROGRESS NOTE   INTERVAL HISTORY Patient is sitting up in bed She remains mostly nonverbal and can barely follow occasional midline commands.  Overall there is no improvement in overall condition and neurological function but also no worsening.   Vital signs are stable. CSF cryptococcal antigen, VDRL and oligoclonal bands all negative.  1433 protein test for CJD is pending OBJECTIVE Vitals:   06/24/20 0104 06/24/20 0429 06/24/20 0745 06/24/20 1238  BP: 106/64 123/85 (!) 139/97 108/72  Pulse: 71 82 78 93  Resp: 18 19 20 20   Temp: 98.2 F (36.8 C) 99.1 F (37.3 C) 99.1 F (37.3 C) 98.7 F (37.1 C)  TempSrc: Oral Oral    SpO2: 97% 100% 100% 99%  Weight:      Height:        CBC:  Recent Labs  Lab 06/19/20 0322 06/23/20 0223  WBC 12.3* 10.4  HGB 14.1 12.9  HCT 41.6 38.8  MCV 89.5 90.2  PLT 304 242    Basic Metabolic Panel:  Recent Labs  Lab 06/19/20 0322 06/23/20 0223  NA 138 139  K 3.8 4.0  CL 105 104  CO2 21* 26  GLUCOSE 205* 165*  BUN 13 19  CREATININE 0.74 0.61  CALCIUM 9.7 9.2    Lipid Panel:     Component Value Date/Time   CHOL 149 03/30/2020 0531   TRIG 82 03/30/2020 0531   HDL 38 (L) 03/30/2020 0531   CHOLHDL 3.9 03/30/2020 0531   VLDL 16 03/30/2020 0531   LDLCALC 95 03/30/2020 0531   HgbA1c:  Lab Results  Component Value Date   HGBA1C 6.9 (H) 06/09/2020   Urine Drug Screen:     Component Value Date/Time   LABOPIA NONE DETECTED 06/08/2020 1349   COCAINSCRNUR NONE DETECTED 06/08/2020 1349   LABBENZ POSITIVE (A) 06/08/2020 1349   AMPHETMU NONE DETECTED 06/08/2020 1349   THCU NONE DETECTED 06/08/2020 1349   LABBARB NONE DETECTED 06/08/2020 1349    Alcohol Level No results found for: [place]  MR BRAIN WO CONTRAST 06/14/2020 IMPRESSION:  1. Motion degraded, incomplete examination.  2. Subacute right parietal infarct.  3. No definite acute infarct identified.  4. Multiple chronic infarcts as above.    TEE 04/01/2020 IMPRESSIONS  1. Left ventricular ejection fraction, by estimation, is 60 to 65%. The  left ventricle has normal function. The left ventricle has no regional  wall motion abnormalities.  2. Right ventricular systolic function is normal. The right ventricular  size is normal.  3. Left atrial size was moderately dilated. No left atrial/left atrial  appendage thrombus was detected.  4. The mitral valve is normal in structure. Mild mitral valve  regurgitation. No evidence of mitral stenosis.  5. Tricuspid valve regurgitation is mild to moderate.  6. The aortic valve is tricuspid. Aortic valve regurgitation is moderate.  Mild to moderate aortic valve sclerosis/calcification is present, without  any evidence of aortic stenosis.  7. The inferior vena cava is normal in size with greater than 50%  respiratory variability, suggesting right atrial pressure of 3 mmHg.  8. Agitated saline contrast bubble study was negative, with no evidence  of any interatrial shunt.   ECG - SR rate 84 BPM. (See cardiology reading for complete details)  PHYSICAL EXAM  Temp:  [98.2 F (36.8 C)-99.1 F (37.3 C)] 98.7 F (37.1 C) (11/23 1238) Pulse Rate:  [71-99] 93 (11/23 1238) Resp:  [18-20] 20 (11/23 1238) BP: (106-139)/(64-111) 108/72 (11/23 1238) SpO2:  [97 %-100 %]  99 % (11/23 1238)  General - Well nourished, well developed, elderly African lady  Ophthalmologic - fundi not visualized due to noncooperation.  Cardiovascular - Regular rhythm and rate.  Neuro -   she is awake and alert.  She does follow midline and appendicular commands although there is some challenge as there is seem to be a language impairment.  She is essentially mute and nonverbal    Eyes midposition, no gaze preference gaze preference, not blinking to visual threat bilaterally. No facial asymmetry, moving all extremities strong equally. Sensation, coordination and gait not tested. Positive grip  reflex.    ASSESSMENT/PLAN [name] is a 64 y.o. female with history of HTN, HLD, DM, recent stroke with complete workup 03/2020 (TEE 04/01/20) returns with imaging showing new subacute stroke. She did not receive IV t-PA due to recent stroke.  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  MRI new subacute right parietal infarct since 03/2020. 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 Multiple other chronic infarct including left PCA, b/l MCA/PCA and left cerebellar. 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  MRI with and without contrast - Subacute right parietal cortical/subcortical infarct as noted previously. Numerous chronic infarcts.  MRA head and neck - unremarkable  May consider cerebral angiogram or brain biopsy to rule out CNS vasculitis  LTM EEG no Sz  EEG 11/7 diffuse encephalopathy   B1, B12, ammonia, TSH, FT4, HIV, RPR all neg  Thyroid antibodies neg  CSF culture - No growth x 3 days  Pan CT no malignancy  LP WBC 0-1, protein 69 and glucose 99  Paraneoplastic panel pending  14-3-3 and RT QuIC pending  On empiric treatment with solumedrol 1g x 5 days  Will let Dr. Leonie Man see her Monday to compare with the condition in 03/2020  Stroke: Subacute right parietal infarct - embolic - source unknown.  MRI head 11/13 - Subacute right parietal infarct. No definite acute infarct identified. Multiple chronic infarcts as above.   MRI with and without contrast 11/18 - Subacute right parietal cortical/subcortical infarct as noted previously. Numerous chronic infarcts.  MRA head and neck - unremarkable  TEE - 04/01/20 - EF 60 - 65%. No cardiac  source of emboli identified.   [employer] Virus 2 - negative  HgbA1c - 6.9  UDS - benzodiazepine  VTE prophylaxis - Lovenox  aspirin 81 mg daily and clopidogrel 75 mg daily prior to admission, now on aspirin 325 mg daily. No DAPT in case of further procedure.  Ongoing aggressive stroke risk factor management  Therapy recommendations: SNF  Disposition:  Pending  Hx of stroke  03/2020 admitted for aphasia and confusion.  CT and MRI showed left frontal parietal infarcts, chronic left frontal, bilateral occipital, left cerebellum infarcts.  MRA unremarkable.  Carotid Doppler negative.  TTE unremarkable.  LDL 94, A1c 6.8.  TEE no source of emboli.  Loop recorder not placed due to no insurance.  Discharged on DAPT and Lipitor 40. Per note, pt on discharge, still had "Receptive and expressive aphasia.Marland KitchenMarland KitchenNot following verbal commands... Nonverbal making unrecognizable sounds and grunts."  Hx of stroke 5-7 years ago, no details  Seizure ruled out  06/07/2020, episode of left arm twitching, altered mental status,  decreased response and roving eyes.    CT 11/6 no acute abnormality.    EEG 11/7 negative for seizure  Currently LTM EEG no Sz so far  D/c LTM EEG  Hypertension  Home BP meds: amlodipine ; lisinopril  Current BP meds: amlodipine  Stable . Long-term BP goal normotensive  Hyperlipidemia  Home Lipid lowering medication: Lipitor 40 mg daily   LDL 94  (03/2020), goal < 70  Current lipid lowering medication: Lipitor 40 mg daily   Continue statin at discharge  Diabetes  Home diabetic meds: metformin  Current diabetic meds: SSI   HgbA1c 6.9, goal < 7.0  CBG monitoring  Other Stroke Risk Factors  Advanced age  Family hx stroke (brother and sister)   Other Active Problems  Code status - Full code  Psych consult 11/9 -> Seroquel and citalopram recommended   Check labs Monday  Hospital day # 16 No family available at the bedside.  Continue ongoing  medical management.  Discussed with Dr. Candiss Norse.  . 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  Stroke team will sign off for now kindly call for questions.  Antony Contras, MD    To contact Stroke Continuity provider, please refer to [email or web address] After hours, contact General Neurology

## 2020-06-24 NOTE — Progress Notes (Signed)
Occupational Therapy Treatment Patient Details Name: Sonya George MRN: [id number] DOB: June 02, 1956 Today's Date: 06/24/2020    History of present illness 64 y.o. female admitted with AMS with difficulty communicating. 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 pt noncompliant spits out medications PMH CVA HTN DM seizures   OT comments  Pt making graudal progress towards OT goals this session. Pt continues to present with cognitive deficits and impaired safety awareness as pt continues to require MAX A +2 for bed mobility and at least min guard for static sitting EOB d/t impulsivity. Attempted sit<>stand with pt unable to come to full stand despite total A +2 as pt more resistant to movement this session. Pt completed lateral scoot to [facility] with MAX A +1. Pt currently requires total A for UB ADLs despite tactile cues and initialhand over hand assist. DC plan remains appropriate, will follow acutely per POC.    Follow Up Recommendations  SNF;Other (comment) (family to d/c home with pt so HHOT)    Equipment Recommendations  3 in 1 bedside commode;Wheelchair (measurements OT);Wheelchair cushion (measurements OT)    Recommendations for Other Services      Precautions / Restrictions Precautions Precautions: Fall;Other (comment) Precaution Comments: tele safety sitter; mitts Restrictions Weight Bearing Restrictions: No       Mobility Bed Mobility Overal bed mobility: Needs Assistance Bed Mobility: Supine to Sit;Sit to Supine     Supine to sit: Max assist;+2 for physical assistance Sit to supine: Max assist;+2 for physical assistance   General bed mobility comments: pt with legs hanging out of bed to pts L side upon arrival, attempted bed mobility towards pts L side with pt needing MAX A +2 to elevate trunk and bring hips to EOB however once pt EOB pt returned self to supine. noticed pt tracking more to pts L side this session therefore attempted bed mobility to pts L side with  pt still needing MAX A +2 however pt able to maintain sitting balance on L side of bed  Transfers Overall transfer level: Needs assistance Equipment used: 2 person hand held assist Transfers: Sit to/from Stand;Lateral/Scoot Transfers   Stand pivot transfers: Total assist;+2 physical assistance      Lateral/Scoot Transfers: Max assist General transfer comment: attempted sit<>stand from EOB with pt unbale to come into full stand with pt resistant to movement despite total A +2. pt did complete lateral scoot to [facility] with MAX A +1 needing assist to shift weight anteriorly to scoot hips laterally    Balance Overall balance assessment: Needs assistance Sitting-balance support: Bilateral upper extremity supported;Feet supported Sitting balance-Leahy Scale: Poor Sitting balance - Comments: requires at least min guard d/t impulsivity     Standing balance-Leahy Scale: Zero Standing balance comment: unable to come into full stand                           ADL either performed or assessed with clinical judgement   ADL Overall ADL's : Needs assistance/impaired     Grooming: Wash/dry face;Bed level;Total assistance Grooming Details (indicate cue type and reason): pt required total A for UB ADLs this session despite max tactile cues and hand over hand assist                 Toilet Transfer: Total assistance;+2 for physical assistance Toilet Transfer Details (indicate cue type and reason): pt unable to come into full stand despite total A +2, pt more resistant to movement this session  Functional mobility during ADLs: Maximal assistance;+2 for physical assistance;Cueing for safety;Total assistance (bed mobility and trial of sit<>stand) General ADL Comments: pt continues to present with cognitive deficits and decreased safety awareness. pt more resistant to movement this session unable to pivot to recliner or come into full stand despite total A +2, pt requried total A  for UB ADLs this session. pt continues to seem internally distracted unable to follow commands     Vision   Vision Assessment?: Vision impaired- to be further tested in functional context Additional Comments: vision continues to be difficult to assess secondary to cognitive deficits however pt continues to prefer R gaze but will track stimulus to L with tactile facilitation, visual deficits likely residual from previous CVA   Perception     Praxis      Cognition Arousal/Alertness: Awake/alert Behavior During Therapy: Restless;Impulsive Overall Cognitive Status: Difficult to assess Area of Impairment: Attention;Following commands;Safety/judgement;Awareness                   Current Attention Level: Focused   Following Commands: Follows one step commands inconsistently Safety/Judgement: Decreased awareness of safety;Decreased awareness of deficits Awareness: Intellectual   General Comments: pt continues to be unable to follow commands, impulsively reaching out for therapists and other objects sporadically during session        Exercises     Shoulder Instructions       General Comments      Pertinent Vitals/ Pain       Pain Assessment: Faces Faces Pain Scale: No hurt  Home Living                                          Prior Functioning/Environment              Frequency  Min 2X/week        Progress Toward Goals  OT Goals(current goals can now be found in the care plan section)  Progress towards OT goals: Progressing toward goals  Acute Rehab OT Goals Patient Stated Goal: Unable to state OT Goal Formulation: With family Time For Goal Achievement: 06/25/20 Potential to Achieve Goals: Good  Plan Discharge plan remains appropriate;Frequency remains appropriate    Co-evaluation                 AM-PAC OT "6 Clicks" Daily Activity     Outcome Measure   Help from another person eating meals?: A Lot Help from another  person taking care of personal grooming?: A Lot Help from another person toileting, which includes using toliet, bedpan, or urinal?: Total Help from another person bathing (including washing, rinsing, drying)?: A Lot Help from another person to put on and taking off regular upper body clothing?: A Lot Help from another person to put on and taking off regular lower body clothing?: Total 6 Click Score: 10    End of Session Equipment Utilized During Treatment: Gait belt  OT Visit Diagnosis: Unsteadiness on feet (R26.81);Muscle weakness (generalized) (M62.81)   Activity Tolerance Other (comment) (impulsivity and decreased ability to follow commands)   Patient Left in bed;with call bell/phone within reach;with bed alarm set   Nurse Communication Mobility status        Time: 1005-1019 OT Time Calculation (min): 14 min  Charges: OT General Charges $OT Visit: 1 Visit OT Treatments $Therapeutic Activity: 8-22 mins  Corinne Ports C., COTA/L Acute Rehabilitation Services  [place] Monica Codd 06/24/2020, 11:28 AM

## 2020-06-24 NOTE — Progress Notes (Addendum)
PROGRESS NOTE        PATIENT DETAILS Name: Sonya George Age: 64 y.o. Sex: female Date of Birth: Dec 05, 1955 Admit Date: 06/07/2020 Admitting Physician Reubin Milan, MD [id number], Delfino Lovett, NP  Brief Narrative: Patient is a 65 y.o. female with past medical history of HTN, DM, prior CVA-who presented with altered mental status after getting news of her sister's passing.  She was admitted for further work-up-extensive neuroimaging/EEG negative-evaluated by both neurology and psychiatry-initially thought to have functional/stress-induced etiology for altered mental status-however due to continued speech abnormalities-rapidly fluctuating/progressive mental status changes-neurology reconsulted-LP performed-further work-up underway-see below.  Significant events: 8/28-8/31>> admit to [facility] for work-up of CVA 11/6>> admit to [facility] for confusion 11/17>> urinary retention-Foley inserted 11/17>> IV steroids 1 g daily x5 days started.  Significant studies: 11/6>> CT head: No acute intracranial abnormality. 11/6>> chest x-ray: No active pulmonary disease. 11/6>> HIV nonreactive 11/7>> vitamin B12 and vitamin B1: Within normal limits 11/7>> RPR: Nonreactive 11/7>> TSH: Within normal limits 11/8>> A1c 6.9 11/7>> vitamin B12: 561 11/13>>MRI Brain:Subacute right parietal infarct. 11/15>> CT chest/abdomen/pelvis: Large stool burden, distended bladder-no acute abnormality in the chest, abdomen or pelvis. 11/16>> CSF VDRL nonreactive, CSF oligoclonal band: Not detected 11/16>> CSF HSV PCR, autoimmune antibody, [place] Nasuti panel: Pending 11/19>> MRI brain: Subacute right parietal cortical/subcortical infarct 11/19>> MRA head/neck: No large vessel occlusion  Antimicrobial therapy: None  Microbiology data: 11/16>> CSF culture: Negative so far  Procedures : 11/7>>spot EEG: Mild diffuse encephalopathy-nonspecific etiology-no seizures. 11/15-11/16>> LTM EEG: No  seizures 11/16>> LP by neurology  Consults: Neurology, psychiatry, Pall care  DVT Prophylaxis : Lovenox   Subjective:  Patient laying in bed, slightly more confused this morning, moving all 4 extremities by herself and appears to be in no distress.  Assessment/Plan:  Acute encephalopathy: Her mental status is somewhat fluctuating from day today continues to have expressive aphasia, had detailed discussion with patient's daughter on 06/23/2020 it appears that patient for several months has been getting forgetful.  Discussed with neurologist Dr. 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  Long-term prognosis is poor, will involve palliative care also for goals of care.  Full neurological work-up done by neurology team including prolonged EEG, LP and MRI.  CSF so far suggestive of slightly increased protein levels, she did a IV steroids 5 doses per neurology.  Now supportive care recommended by neurology.CSF HSV PCR, autoimmune antibody, [place] Nasuti panel: Pending  Subacute CVA found as below. Reduce Benzodiazepine use and use as needed Haldol if needed.  Subacute CVA-recent hospitalization for CVA: Unlikely causing her confusion/AMS-continue aspirin/statin.  Had an extensive stroke work-up during her most recent hospitalization.  Acute urinary retention: Foley Flomax, trial of removing Foley catheter on 06/23/2020.  Failed removal replace on 06/24/2020.  HLD: Continue statin  HTN: BP stable-continue amlodipine-lisinopril remains on hold.   DM-2 (A1c 6.9 on 11/8) with uncontrolled hyperglycemia due to steroids: CBGs on the higher side today-due to steroids-continued 10 units of Lantus and SSI-follow and adjust.    Recent Labs    06/23/20 1643 06/23/20 2108 06/24/20 0630  GLUCAP 109* 152* 119*    Diet: Diet Order            Diet heart healthy/carb modified Room service appropriate? No; Fluid consistency: Thin  Diet effective now  Code Status: Full code   Family Communication: Daughter [name] [phone]) at bedside on 06/21/20 previous MD, 06/22/20, 06/23/20 by me over the phone. 06/24/20 message left at 11:53 AM.   Disposition Plan: Status is: Inpatient  Remains inpatient appropriate because:Inpatient level of care appropriate due to severity of illness   Dispo: The patient is from: Home              Anticipated d/c is to: TBD              Anticipated d/c date is: 2 days              Patient currently is not medically stable to d/c.   Barriers to Discharge: Continued altered mental status-not yet at baseline-IV steroid daily for 5 days  Antimicrobial agents: Anti-infectives (From admission, onward)   None       Time spent: 15- minutes-Greater than 50% of this time was spent in counseling, explanation of diagnosis, planning of further management, and coordination of care.  MEDICATIONS: Scheduled Meds: . amLODipine  5 mg Oral Daily  . aspirin EC  325 mg Oral Daily  . atorvastatin  40 mg Oral q1800  . Chlorhexidine Gluconate Cloth  6 each Topical Daily  . enoxaparin (LOVENOX) injection  40 mg Subcutaneous Q24H  . insulin aspart  0-20 Units Subcutaneous TID WC  . insulin aspart  0-5 Units Subcutaneous QHS  . insulin glargine  10 Units Subcutaneous Daily  . multivitamin with minerals  1 tablet Oral Daily  . QUEtiapine  50 mg Oral QHS  . tamsulosin  0.4 mg Oral Daily   Continuous Infusions: . sodium chloride Stopped (06/19/20 0101)   PRN Meds:.sodium chloride, acetaminophen **OR** acetaminophen, bisacodyl, diphenhydrAMINE, fentaNYL (SUBLIMAZE) injection, haloperidol lactate, labetalol, [DISCONTINUED] ondansetron **OR** ondansetron (ZOFRAN) IV, sodium phosphate   PHYSICAL EXAM: Vital signs: Vitals:   06/23/20 2042 06/24/20 0104 06/24/20 0429 06/24/20 0745  BP: 125/78 106/64 123/85 (!) 139/97  Pulse: 73 71 82 78  Resp: 18 18 19 20   Temp: 98.4 F (36.9 C) 98.2 F  (36.8 C) 99.1 F (37.3 C) 99.1 F (37.3 C)  TempSrc: Oral Oral Oral   SpO2: 100% 97% 100% 100%  Weight:      Height:       Filed Weights   06/07/20 0840 06/20/20 0753  Weight: 63.5 kg 63.5 kg   Body mass index is 22.6 kg/m.   Gen Exam:   Awake slightly more confused early in the morning, unable to follow commands or answer questions appropriately [place].AT,PERRAL Supple Neck,No JVD, No cervical lymphadenopathy appriciated.  Symmetrical Chest wall movement, Good air movement bilaterally, CTAB RRR,No Gallops, Rubs or new Murmurs, No Parasternal Heave +ve B.Sounds, Abd Soft, No tenderness, No organomegaly appriciated, No rebound - guarding or rigidity. No Cyanosis, Clubbing or edema, No new Rash or bruise     I have personally reviewed following labs and imaging studies  LABORATORY DATA: CBC: Recent Labs  Lab 06/19/20 0322 06/23/20 0223  WBC 12.3* 10.4  HGB 14.1 12.9  HCT 41.6 38.8  MCV 89.5 90.2  PLT 304 812    Basic Metabolic Panel: Recent Labs  Lab 06/19/20 0322 06/23/20 0223  NA 138 139  K 3.8 4.0  CL 105 104  CO2 21* 26  GLUCOSE 205* 165*  BUN 13 19  CREATININE 0.74 0.61  CALCIUM 9.7 9.2    GFR: Estimated Creatinine Clearance: 65.6 mL/min (by C-G formula based on SCr of 0.61 mg/dL).  Liver Function Tests:  Recent Labs  Lab 06/19/20 0322  AST 35  ALT 20  ALKPHOS 63  BILITOT 0.7  PROT 7.2  ALBUMIN 3.4*   No results for input(s): LIPASE, AMYLASE in the last 168 hours. No results for input(s): AMMONIA in the last 168 hours.  Coagulation Profile: No results for input(s): INR, PROTIME in the last 168 hours.  Cardiac Enzymes: No results for input(s): CKTOTAL, CKMB, CKMBINDEX, TROPONINI in the last 168 hours.  BNP (last 3 results) No results for input(s): PROBNP in the last 8760 hours.  Lipid Profile: No results for input(s): CHOL, HDL, LDLCALC, TRIG, CHOLHDL, LDLDIRECT in the last 72 hours.  Thyroid Function Tests: No results for  input(s): TSH, T4TOTAL, FREET4, T3FREE, THYROIDAB in the last 72 hours.  Anemia Panel: No results for input(s): VITAMINB12, FOLATE, FERRITIN, TIBC, IRON, RETICCTPCT in the last 72 hours.  Urine analysis:    Component Value Date/Time   COLORURINE STRAW (A) 06/08/2020 1349   APPEARANCEUR CLEAR 06/08/2020 1349   LABSPEC 1.015 06/08/2020 1349   PHURINE 8.0 06/08/2020 1349   GLUCOSEU 100 (A) 06/08/2020 1349   HGBUR NEGATIVE 06/08/2020 1349   BILIRUBINUR NEGATIVE 06/08/2020 1349   KETONESUR 15 (A) 06/08/2020 1349   PROTEINUR NEGATIVE 06/08/2020 1349   NITRITE NEGATIVE 06/08/2020 1349   LEUKOCYTESUR NEGATIVE 06/08/2020 1349    Sepsis Labs: Lactic Acid, Venous No results found for: LATICACIDVEN  MICROBIOLOGY: Recent Results (from the past 240 hour(s))  CSF culture     Status: None   Collection Time: 06/17/20  7:08 PM   Specimen: CSF  Result Value Ref Range Status   Specimen Description CSF  Final   Special Requests NONE  Final   Gram Stain NO WBC SEEN NO ORGANISMS SEEN   Final   Culture   Final    NO GROWTH 3 DAYS Performed at [facility], [place] [address] [place], [place] [address]    Report Status 06/21/2020 FINAL  Final    RADIOLOGY STUDIES/RESULTS: No results found.   LOS: 16 days   Signature  Lala Lund M.D on 06/24/2020 at 11:52 AM   -  To page go to [email or web address]

## 2020-06-24 NOTE — Consult Note (Signed)
Consultation Note Date: 06/24/2020   Patient Name: Sonya George  DOB: [birth year]  MRN: [id number]  Age / Sex: 64 y.o., female  PCP: Maximiano Coss, NP Referring Physician: Thurnell Lose, MD  Reason for Consultation: Establishing goals of care and Psychosocial/spiritual support  HPI/Patient Profile: 64 y.o. female  admitted on 06/07/2020 with past  medical history significant of type 2 diabetes, hyperlipidemia, hypertension, history of other nonhemorrhagic CVA in August/ 2021  who was brought to the emergency department via EMS due to altered mental status.  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  Currently patient is confused x3 and unable to participate in any meaningful conversation with her daughter at the bedside. Today is day 17 of this hospitalization.  Family face treatment option decisions, advanced directive decisions and anticipatory care needs   Clinical Assessment and Goals of Care:   This NP Wadie Lessen reviewed medical records, received report from team, assessed the patient and then meet at the patient's bedside  along with her daughter/Sonya George to discuss diagnosis, prognosis, GOC, EOL wishes disposition and options.   Concept of Palliative Care was introduced as specialized medical care for people and their families living with serious illness.  If focuses on providing relief from the symptoms and stress of a serious illness.  The goal is to improve quality of life for both the patient and the family.  Created space and opportunity for family to explore thoughts and feelings regarding current medical situation.  Daughter shares this difficult situation.  Prior to this hospitalization the patient lives at home with her daughter and actually helped her daughter with her 3 small children.  The daughter's husband is at home in  country  of [place] with no known time of return to the states.  The patient is a CNA.  There is no other family that live locally that help in the care of this patient, her mother.  Daughter is concerned and frustrated with her limited options and transition of care. She tells me that transition to a skilled nursing facility is out of the question. Ultimately she will take the patient home and fly with her to [place] to get her back to her mother country, there hopefully family will be available to care for this patient.    A  discussion was had today regarding advanced directives.  Concepts specific to code status, artifical feeding and hydration, continued IV antibiotics and rehospitalization was had.  The difference between a aggressive medical intervention path  and a palliative comfort care path for this patient at this time was had.    MOST form  introduced, daughter declined completion today. Hard choices booklet was left for review.     Questions and concerns addressed.  Patient  encouraged to call with questions or concerns.     PMT will continue to support holistically.           No documented healthcare power of attorney or advanced directive.  The patient's only living relative here in  the [place] is her daughter Sonya George Patient does not have medical decisional capacity at this time. Encouraged family to consider documentation of H POA and advanced directive with the support of the spiritual care team while she is here in the hospital. Declined at this time.  SUMMARY OF RECOMMENDATIONS    Code Status/Advance Care Planning:  Full code  Encouraged patient/family to consider DNR/DNI status understanding evidenced based poor outcomes in similar hospitalized patient, as the cause of arrest is likely associated with advanced chronic illness rather than an easily reversible acute cardio-pulmonary event.    Palliative Prophylaxis:   Aspiration, Bowel Regimen, Delirium Protocol,  Frequent Pain Assessment and Oral Care  Additional Recommendations (Limitations, Scope, Preferences):  Full Scope Treatment  Psycho-social/Spiritual:   Desire for further Chaplaincy support:no  Additional Recommendations: Education on Hospice  Prognosis:   Unable to determine  Discharge Planning: Ultimately daughter plans on accompanying her mother back to [place]   Home with Home Health      Primary Diagnoses: Present on Admission: . Acute encephalopathy . Essential hypertension . Hyperlipidemia . Cerebral embolism with cerebral infarction [facility])   I have reviewed the medical record, interviewed the patient and family, and examined the patient. The following aspects are pertinent.  Past Medical History:  Diagnosis Date  . Diabetes mellitus without complication ([place])   . Hyperlipidemia 06/07/2020  . Hypertension   . Stroke [facility])    Social History   Socioeconomic History  . Marital status: Married    Spouse name: Not on file  . Number of children: Not on file  . Years of education: Not on file  . Highest education level: Not on file  Occupational History  . Occupation: unemployed  Tobacco Use  . Smoking status: Never Smoker  . Smokeless tobacco: Never Used  Vaping Use  . Vaping Use: Never used  Substance and Sexual Activity  . Alcohol use: Never  . Drug use: Never  . Sexual activity: Not on file  Other Topics Concern  . Not on file  Social History Narrative  . Not on file   Social Determinants of Health   Financial Resource Strain:   . Difficulty of Paying Living Expenses: Not on file  Food Insecurity:   . Worried About Charity fundraiser in the Last Year: Not on file  . Ran Out of Food in the Last Year: Not on file  Transportation Needs:   . Lack of Transportation (Medical): Not on file  . Lack of Transportation (Non-Medical): Not on file  Physical Activity:   . Days of Exercise per Week: Not on file  . Minutes of Exercise per Session: Not on  file  Stress:   . Feeling of Stress : Not on file  Social Connections:   . Frequency of Communication with Friends and Family: Not on file  . Frequency of Social Gatherings with Friends and Family: Not on file  . Attends Religious Services: Not on file  . Active Member of Clubs or Organizations: Not on file  . Attends Archivist Meetings: Not on file  . Marital Status: Not on file   Family History  Problem Relation Age of Onset  . Stroke Sister        Actively dying of CVA  . Stroke Brother        Died 2 weeks ago of CVA   Scheduled Meds: . amLODipine  5 mg Oral Daily  . aspirin EC  325 mg Oral Daily  .  atorvastatin  40 mg Oral q1800  . Chlorhexidine Gluconate Cloth  6 each Topical Daily  . enoxaparin (LOVENOX) injection  40 mg Subcutaneous Q24H  . insulin aspart  0-20 Units Subcutaneous TID WC  . insulin aspart  0-5 Units Subcutaneous QHS  . insulin glargine  10 Units Subcutaneous Daily  . multivitamin with minerals  1 tablet Oral Daily  . QUEtiapine  50 mg Oral QHS  . tamsulosin  0.4 mg Oral Daily   Continuous Infusions: . sodium chloride Stopped (06/19/20 0101)   PRN Meds:.sodium chloride, acetaminophen **OR** acetaminophen, bisacodyl, diphenhydrAMINE, fentaNYL (SUBLIMAZE) injection, haloperidol lactate, labetalol, [DISCONTINUED] ondansetron **OR** ondansetron (ZOFRAN) IV, sodium phosphate Medications Prior to Admission:  Prior to Admission medications   Medication Sig Start Date End Date Taking? Authorizing Provider  acetaminophen (TYLENOL) 500 MG tablet Take 1,000 mg by mouth every 6 (six) hours as needed for headache (pain).   Yes [provider]  amLODipine (NORVASC) 10 MG tablet Take 1 tablet (10 mg total) by mouth daily. 12/14/19  Yes Maximiano Coss, NP  aspirin EC 81 MG EC tablet Take 1 tablet (81 mg total) by mouth daily. Swallow whole. 04/02/20  Yes Vann, Jessica U, DO  atorvastatin (LIPITOR) 40 MG tablet Take 1 tablet (40 mg total) by mouth  daily. 04/02/20  Yes Geradine Girt, DO  clopidogrel (PLAVIX) 75 MG tablet Take 1 tablet (75 mg total) by mouth daily. 04/02/20  Yes Vann, Jessica U, DO  lisinopril (ZESTRIL) 10 MG tablet Take 1 tablet (10 mg total) by mouth daily. 12/14/19  Yes Maximiano Coss, NP  Menthol, Topical Analgesic, (BIOFREEZE EX) Apply 1 application topically daily as needed (leg pain).   Yes [provider]  metFORMIN (GLUCOPHAGE) 500 MG tablet Take 2 tablets (1,000 mg total) by mouth 2 (two) times daily with a meal. 12/14/19 06/11/20 Yes Maximiano Coss, NP  Multiple Vitamins-Minerals (MULTIVITAMIN WITH MINERALS) tablet Take 1 tablet by mouth daily.   Yes [provider]   No Known Allergies Review of Systems  Unable to perform ROS: Mental status change    Physical Exam Constitutional:      Appearance: She is underweight. She is ill-appearing.     Comments: -confused and easily agiated  Neurological:     Mental Status: She is alert.     Vital Signs: BP (!) 139/97 (BP Location: Right Arm)   Pulse 78   Temp 99.1 F (37.3 C)   Resp 20   Ht 5\' 6"  (1.676 m)   Wt 63.5 kg   SpO2 100%   BMI 22.60 kg/m  Pain Scale: Faces POSS *See Group Information*: 1-Acceptable,Awake and alert Pain Score: 0-No pain   SpO2: SpO2: 100 % O2 Device:SpO2: 100 % O2 Flow Rate: .   IO: Intake/output summary:   Intake/Output Summary (Last 24 hours) at 06/24/2020 0848 Last data filed at 06/24/2020 0200 Gross per 24 hour  Intake 2574.07 ml  Output 1275 ml  Net 1299.07 ml    LBM: Last BM Date: 06/21/20 Baseline Weight: Weight: 63.5 kg Most recent weight: Weight: 63.5 kg     Palliative Assessment/Data: 30 % at best   Discussed with Dr Candiss Norse  Time In: 1300 Time Out: 1410 Time Total: 70 minutes Greater than 50%  of this time was spent counseling and coordinating care related to the above assessment and plan.  Signed by: Wadie Lessen, NP   Please contact Palliative Medicine Team phone at [phone]  for questions and concerns.  For individual provider: See  Amion

## 2020-06-24 NOTE — Progress Notes (Addendum)
Patient Sonya George had bladder scan at 1:21AM and residual volume of 447mL was found. MD was notified and ordered In & Out catheterization as needed.  In & Out catheterization was done at 2:00AM and the output was 522mL. Patient tolerated moderately well overall despite some restlessness.   Patient now is currently sleeping comfortably.   Philomena Course, RN 06/24/20 2:28 AM

## 2020-06-25 DIAGNOSIS — Z7189 Other specified counseling: Secondary | ICD-10-CM

## 2020-06-25 DIAGNOSIS — R451 Restlessness and agitation: Secondary | ICD-10-CM

## 2020-06-25 DIAGNOSIS — Z515 Encounter for palliative care: Secondary | ICD-10-CM

## 2020-06-25 LAB — GLUCOSE, CAPILLARY
Glucose-Capillary: 107 mg/dL — ABNORMAL HIGH (ref 70–99)
Glucose-Capillary: 235 mg/dL — ABNORMAL HIGH (ref 70–99)

## 2020-06-25 MED ORDER — QUETIAPINE FUMARATE 50 MG PO TABS: 50.0000 mg | ORAL_TABLET | Freq: Every day | ORAL | 0 refills | Status: AC

## 2020-06-25 MED ORDER — HALOPERIDOL 2 MG PO TABS: 2.0000 mg | ORAL_TABLET | Freq: Three times a day (TID) | ORAL | 0 refills | Status: AC | PRN

## 2020-06-25 MED ORDER — TAMSULOSIN HCL 0.4 MG PO CAPS: 0.4000 mg | ORAL_CAPSULE | Freq: Every day | ORAL | 0 refills | Status: AC

## 2020-06-25 MED ORDER — ASPIRIN 325 MG PO TBEC: 325.0000 mg | DELAYED_RELEASE_TABLET | Freq: Every day | ORAL | 0 refills | Status: AC

## 2020-06-25 NOTE — Discharge Instructions (Signed)
Follow with Primary MD Maximiano Coss, NP in 7 days   Get CBC, CMP, 2 view Chest X ray -  checked next visit within 1 week by Primary MD    Activity: As tolerated with Full fall precautions use walker/cane & assistance as needed  Disposition Home   Diet: Soft Low Carb with feeding assistance and aspiration precautions.  Special Instructions: If you have smoked or chewed Tobacco  in the last 2 yrs please stop smoking, stop any regular Alcohol  and or any Recreational drug use.  On your next visit with your primary care physician please Get Medicines reviewed and adjusted.  Please request your Prim.MD to go over all Hospital Tests and Procedure/Radiological results at the follow up, please get all Hospital records sent to your Prim MD by signing hospital release before you go home.  If you experience worsening of your admission symptoms, develop shortness of breath, life threatening emergency, suicidal or homicidal thoughts you must seek medical attention immediately by calling 911 or calling your MD immediately  if symptoms less severe.  You Must read complete instructions/literature along with all the possible adverse reactions/side effects for all the Medicines you take and that have been prescribed to you. Take any new Medicines after you have completely understood and accpet all the possible adverse reactions/side effects.

## 2020-06-25 NOTE — Discharge Summary (Signed)
Sonya George PPI:[id number] DOB: August 31, 1955 DOA: 06/07/2020  PCP: Maximiano Coss, NP  Admit date: 06/07/2020  Discharge date: 06/25/2020  Admitted From: Home   Disposition:  Home ( refused SNF by daughter)   Recommendations for Outpatient Follow-up:   Follow up with PCP in 1-2 weeks  PCP Please obtain BMP/CBC, 2 view CXR in 1week,  (see Discharge instructions)   PCP Please follow up on the following pending results: follow .CSF HSV PCR, autoimmune antibody, [place] Nasuti panel: Pending   Home Health: PT,RN,OT,SLP, S Work   Equipment/Devices: rolling walker, 3in1  Consultations: Neuro Discharge Condition: Fair   CODE STATUS: Full    Diet Recommendation: Heart Healthy Low Carb  Diet Order            Diet - low sodium heart healthy           Diet heart healthy/carb modified Room service appropriate? No; Fluid consistency: Thin  Diet effective now                  Chief Complaint  Patient presents with  . Altered Mental Status     Brief history of present illness from the day of admission and additional interim summary     Patient is a 64 y.o. female with past medical history of HTN, DM, prior CVA-who presented with altered mental status after getting news of her sister's passing.  She was admitted for further work-up-extensive neuroimaging/EEG negative-evaluated by both neurology and psychiatry-initially thought to have functional/stress-induced etiology for altered mental status-however due to continued speech abnormalities-rapidly fluctuating/progressive mental status changes-neurology reconsulted-LP performed-further work-up underway-see below.  Significant events: 8/28-8/31>> admit to [facility] for work-up of CVA 11/6>> admit to [facility] for confusion 11/17>> urinary retention-Foley  inserted 11/17>> IV steroids 1 g daily x5 days started.  Significant studies: 11/6>> CT head: No acute intracranial abnormality. 11/6>> chest x-ray: No active pulmonary disease. 11/6>> HIV nonreactive 11/7>> vitamin B12 and vitamin B1: Within normal limits 11/7>> RPR: Nonreactive 11/7>> TSH: Within normal limits 11/8>> A1c 6.9 11/7>> vitamin B12: 561 11/13>>MRI Brain:Subacute right parietal infarct. 11/15>> CT chest/abdomen/pelvis: Large stool burden, distended bladder-no acute abnormality in the chest, abdomen or pelvis. 11/16>> CSF VDRL nonreactive, CSF oligoclonal band: Not detected 11/16>> CSF HSV PCR, autoimmune antibody, [place] Nasuti panel: Pending 11/19>> MRI brain: Subacute right parietal cortical/subcortical infarct 11/19>> MRA head/neck: No large vessel occlusion  Antimicrobial therapy: None  Microbiology data: 11/16>> CSF culture: Negative so far  Procedures : 11/7>>spot EEG: Mild diffuse encephalopathy-nonspecific etiology-no seizures. 11/15-11/16>> LTM EEG: No seizures 11/16>> LP by neurology  Consults: Neurology, psychiatry, [facility]   Acute encephalopathy: Her mental status is somewhat  fluctuating from day today continues to have expressive aphasia, had detailed discussion with patient's daughter on 06/23/2020 it appears that patient for several months has been getting forgetful.  Discussed with neurologist Dr. 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  Long-term prognosis is poor, will involve palliative care also for goals of care.  Full neurological work-up done by neurology team including prolonged EEG, LP and MRI.  CSF so far suggestive of slightly increased protein levels, she did a IV steroids 5 doses per neurology.  Now supportive care recommended by neurology which is aspirin and statin.CSF HSV PCR, autoimmune antibody,  [place] Nasuti panel: Pending  Subacute CVA found as below.  Discussed with daughter, palliative care in detail on 06/24/2020.  Daughter does not want SNF and wants to take her home, she is trying to take her to [place] over the next few days.  I have counseled her that patients needs might be way too much for family to take care however she still wants to take her home.  Subacute CVA-recent hospitalization for CVA: Unlikely causing her confusion/AMS-continue aspirin/statin.  Had an extensive stroke work-up during her most recent hospitalization.  Discussed the plan with Dr. Leonie Man neurologist in detail on 06/24/2020.  No further stroke or neurological work-up.  Outpatient follow-up with neurology in 2 weeks [place].  Acute urinary retention: Foley Flomax, discontinue Foley today.  HLD: Continue statin  HTN: BP stable-continue home regimen upon discharge.  DM-2 (A1c 6.9 on 11/8) - continue home regimen and follow with PCP for glycemic control.     Discharge diagnosis     Principal Problem:   Acute encephalopathy Active Problems:   Type 2 diabetes mellitus (HCC)   Essential hypertension   Cerebral embolism with cerebral infarction ([place])   Seizure ([place])   Hyperlipidemia    Discharge instructions    Discharge Instructions    Diet - low sodium heart healthy   Complete by: As directed    Discharge instructions   Complete by: As directed    Follow with Primary MD Maximiano Coss, NP in 7 days   Get CBC, CMP, 2 view Chest X ray -  checked next visit within 1 week by Primary MD    Activity: As tolerated with Full fall precautions use walker/cane & assistance as needed  Disposition Home   Diet: Soft Low Carb with feeding assistance and aspiration precautions.  Special Instructions: If you have smoked or chewed Tobacco  in the last 2 yrs please stop smoking, stop any regular Alcohol  and or any Recreational drug use.  On your next visit with your  primary care physician please Get Medicines reviewed and adjusted.  Please request your Prim.MD to go over all Hospital Tests and Procedure/Radiological results at the follow up, please get all Hospital records sent to your Prim MD by signing hospital release before you go home.  If you experience worsening of your admission symptoms, develop shortness of breath, life threatening emergency, suicidal or homicidal thoughts you must seek medical attention immediately by calling 911 or calling your MD immediately  if symptoms less severe.  You Must read complete instructions/literature along with all the possible adverse reactions/side effects for all the Medicines you take and that have been prescribed to you. Take any new Medicines after you have completely understood and accpet all the possible adverse reactions/side effects.      Discharge Medications   Allergies as of 06/25/2020   No  Known Allergies     Medication List    STOP taking these medications   clopidogrel 75 MG tablet Commonly known as: PLAVIX     TAKE these medications   acetaminophen 500 MG tablet Commonly known as: TYLENOL Take 1,000 mg by mouth every 6 (six) hours as needed for headache (pain).   amLODipine 10 MG tablet Commonly known as: NORVASC Take 1 tablet (10 mg total) by mouth daily.   aspirin 325 MG EC tablet Take 1 tablet (325 mg total) by mouth daily. What changed:   medication strength  how much to take  additional instructions   atorvastatin 40 MG tablet Commonly known as: LIPITOR Take 1 tablet (40 mg total) by mouth daily.   BIOFREEZE EX Apply 1 application topically daily as needed (leg pain).   haloperidol 2 MG tablet Commonly known as: HALDOL Take 1 tablet (2 mg total) by mouth every 8 (eight) hours as needed for agitation.   lisinopril 10 MG tablet Commonly known as: ZESTRIL Take 1 tablet (10 mg total) by mouth daily.   metFORMIN 500 MG tablet Commonly known as: GLUCOPHAGE Take  2 tablets (1,000 mg total) by mouth 2 (two) times daily with a meal.   multivitamin with minerals tablet Take 1 tablet by mouth daily.   QUEtiapine 50 MG tablet Commonly known as: SEROQUEL Take 1 tablet (50 mg total) by mouth at bedtime.   tamsulosin 0.4 MG Caps capsule Commonly known as: FLOMAX Take 1 capsule (0.4 mg total) by mouth daily.            Durable Medical Equipment  (From admission, onward)         Start     Ordered   06/24/20 1516  For home use only DME Bedside commode  Once       Question:  Patient needs a bedside commode to treat with the following condition  Answer:  Weakness   06/24/20 1516   06/24/20 1516  For home use only DME Walker rolling  Once       Comments: 5 wheel  Question Answer Comment  Walker: With [place]   Patient needs a walker to treat with the following condition Weakness      06/24/20 1516           Follow-up Information    Maximiano Coss, NP. Schedule an appointment as soon as possible for a visit in 1 week(s).   Specialty: Adult Health Nurse Practitioner Contact information: [place] [address] [phone]        Garvin Fila, MD. Schedule an appointment as soon as possible for a visit in 2 week(s).   Specialties: Neurology, Radiology Contact information: [address] [place] [address] [phone]               Major procedures and Radiology Reports - PLEASE review detailed and final reports thoroughly  -       CT HEAD WO CONTRAST  Result Date: 06/07/2020 CLINICAL DATA:  64 year old female with history of mental status change. EXAM: CT HEAD WITHOUT CONTRAST TECHNIQUE: Contiguous axial images were obtained from the base of the skull through the vertex without intravenous contrast. COMPARISON:  Head CT 03/29/2020. FINDINGS: Brain: Mild cerebral atrophy. Patchy and confluent areas of decreased attenuation are noted throughout the deep and periventricular white matter of  the cerebral hemispheres bilaterally, compatible with chronic microvascular ischemic disease. In addition, there are more well-defined areas of low attenuation scattered in  a patchy distribution throughout the left cerebral hemisphere, right occipital region, and left cerebellar hemisphere compatible with areas of encephalomalacia/gliosis from prior infarctions. No definitive evidence of new acute or subacute ischemia. No definite mass or mass effect. No hydrocephalus. Vascular: No hyperdense vessel or unexpected calcification. Skull: Normal. Negative for fracture or focal lesion. Sinuses/Orbits: No acute finding. Other: None. IMPRESSION: 1. No acute intracranial abnormalities. 2. Mild cerebral atrophy with chronic microvascular ischemic changes and patchy areas of encephalomalacia/gliosis in the cerebrum and cerebellum, similar to prior examinations, as above. Electronically Signed   By: Vinnie Langton M.D.   On: 06/07/2020 10:03   MR ANGIO HEAD WO CONTRAST  Result Date: 06/20/2020 CLINICAL DATA:  Altered mental status EXAM: MRI HEAD WITHOUT AND WITH CONTRAST MRA HEAD WITHOUT CONTRAST MRA NECK WITHOUT AND WITH CONTRAST TECHNIQUE: Multiplanar, multiecho pulse sequences of the brain and surrounding structures were obtained without and with intravenous contrast. Angiographic images of the Circle of Willis were obtained using MRA technique without intravenous contrast. Angiographic images of the neck were obtained using MRA technique without and with intravenous contrast. Carotid stenosis measurements (when applicable) are obtained utilizing NASCET criteria, using the distal internal carotid diameter as the denominator. CONTRAST:  6.52mL GADAVIST GADOBUTROL 1 MMOL/ML IV SOLN COMPARISON:  06/15/2019 and 03/30/2019 FINDINGS: MRI HEAD Brain: As seen previously, there is a subacute right parietal cortical/subcortical infarct. There is some associated enhancement. There is unchanged residual diffusion hyperintensity  in the area of chronic left parietal infarct. Multiple additional chronic infarcts are again noted including involvement of the bilateral frontal, parietal, and occipital lobes as well as left greater than right cerebellum and right thalamus. There is susceptibility and T1 shortening associated with some infarcts reflecting chronic blood products and laminar necrosis. Stable calcified meningioma along the right anterior falx without underlying parenchymal edema. Additional areas of T2 hyperintensity in the supratentorial and pontine white matter likely reflect stable chronic microvascular ischemic changes. Ventricles are stable in size. Vascular: Major vessel flow voids at the skull base are preserved. Skull and upper cervical spine: Normal marrow signal is preserved. Sinuses/Orbits: Paranasal sinuses are aerated. Orbits are unremarkable. Other: Sella is unremarkable.  Mastoid air cells are clear. MRA HEAD Intracranial internal carotid arteries are patent. Middle and anterior cerebral arteries are patent. Intracranial vertebral arteries, basilar artery, posterior cerebral arteries are patent. There is no significant stenosis or aneurysm. MRA NECK Common, internal, and external carotid arteries are patent. Extracranial vertebral arteries are patent and codominant. There is no measurable stenosis or evidence of dissection. IMPRESSION: Subacute right parietal cortical/subcortical infarct as noted previously. Numerous chronic infarcts. No large vessel occlusion, hemodynamically significant stenosis, or evidence of dissection. 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 Electronically Signed   By: Macy Mis M.D.   On: 06/20/2020 14:27   MR ANGIO NECK W WO CONTRAST  Result Date: 06/20/2020 CLINICAL DATA:  Altered mental status EXAM: MRI HEAD WITHOUT AND WITH CONTRAST MRA HEAD WITHOUT CONTRAST MRA NECK WITHOUT AND WITH CONTRAST TECHNIQUE: Multiplanar, multiecho pulse sequences of the brain and  surrounding structures were obtained without and with intravenous contrast. Angiographic images of the Circle of Willis were obtained using MRA technique without intravenous contrast. Angiographic images of the neck were obtained using MRA technique without and with intravenous contrast. Carotid stenosis measurements (when applicable) are obtained utilizing NASCET criteria, using the distal internal carotid diameter as the denominator. CONTRAST:  6.64mL GADAVIST GADOBUTROL 1 MMOL/ML IV SOLN COMPARISON:  06/15/2019 and 03/30/2019 FINDINGS: MRI HEAD Brain: As seen  previously, there is a subacute right parietal cortical/subcortical infarct. There is some associated enhancement. There is unchanged residual diffusion hyperintensity in the area of chronic left parietal infarct. Multiple additional chronic infarcts are again noted including involvement of the bilateral frontal, parietal, and occipital lobes as well as left greater than right cerebellum and right thalamus. There is susceptibility and T1 shortening associated with some infarcts reflecting chronic blood products and laminar necrosis. Stable calcified meningioma along the right anterior falx without underlying parenchymal edema. Additional areas of T2 hyperintensity in the supratentorial and pontine white matter likely reflect stable chronic microvascular ischemic changes. Ventricles are stable in size. Vascular: Major vessel flow voids at the skull base are preserved. Skull and upper cervical spine: Normal marrow signal is preserved. Sinuses/Orbits: Paranasal sinuses are aerated. Orbits are unremarkable. Other: Sella is unremarkable.  Mastoid air cells are clear. MRA HEAD Intracranial internal carotid arteries are patent. Middle and anterior cerebral arteries are patent. Intracranial vertebral arteries, basilar artery, posterior cerebral arteries are patent. There is no significant stenosis or aneurysm. MRA NECK Common, internal, and external carotid arteries  are patent. Extracranial vertebral arteries are patent and codominant. There is no measurable stenosis or evidence of dissection. IMPRESSION: Subacute right parietal cortical/subcortical infarct as noted previously. Numerous chronic infarcts. No large vessel occlusion, hemodynamically significant stenosis, or evidence of dissection. 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 Electronically Signed   By: Macy Mis M.D.   On: 06/20/2020 14:27   MR BRAIN WO CONTRAST  Result Date: 06/14/2020 CLINICAL DATA:  Mental status change, unknown cause. Confusion, delirium, and agitation. EXAM: MRI HEAD WITHOUT CONTRAST TECHNIQUE: Multiplanar, multiecho pulse sequences of the brain and surrounding structures were obtained without intravenous contrast. COMPARISON:  Head CT 06/07/2020 and MRI 03/29/2020 FINDINGS: The patient was unable to tolerate the examination despite being medicated. Axial and coronal diffusion, axial FLAIR, and T2* gradient echo sequences were obtained and are motion degraded including severe motion on the gradient echo sequence. No sizable acute infarct is identified on motion degraded diffusion imaging. There is a small amount of residual hyperintense trace diffusion weighted signal abnormality in the area of left parietal infarction which was acute on the prior MRI and now demonstrates encephalomalacia. There is a subacute right parietal infarct which is new from the prior MRI. Multiple chronic infarcts are again noted including left MCA, bilateral parieto-occipital, and left cerebellar infarcts. No midline shift or extra-axial fluid collection is evident. 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 IMPRESSION: 1. Motion degraded, incomplete examination. 2. Subacute right parietal infarct. 3. No definite acute infarct identified. 4. Multiple chronic infarcts as above. Electronically Signed   By: Logan Bores M.D.   On: 06/14/2020 19:56   MR BRAIN W WO  CONTRAST  Result Date: 06/20/2020 CLINICAL DATA:  Altered mental status EXAM: MRI HEAD WITHOUT AND WITH CONTRAST MRA HEAD WITHOUT CONTRAST MRA NECK WITHOUT AND WITH CONTRAST TECHNIQUE: Multiplanar, multiecho pulse sequences of the brain and surrounding structures were obtained without and with intravenous contrast. Angiographic images of the Circle of Willis were obtained using MRA technique without intravenous contrast. Angiographic images of the neck were obtained using MRA technique without and with intravenous contrast. Carotid stenosis measurements (when applicable) are obtained utilizing NASCET criteria, using the distal internal carotid diameter as the denominator. CONTRAST:  6.86mL GADAVIST GADOBUTROL 1 MMOL/ML IV SOLN COMPARISON:  06/15/2019 and 03/30/2019 FINDINGS: MRI HEAD Brain: As seen previously, there is a subacute right parietal cortical/subcortical infarct. There is some associated enhancement.  There is unchanged residual diffusion hyperintensity in the area of chronic left parietal infarct. Multiple additional chronic infarcts are again noted including involvement of the bilateral frontal, parietal, and occipital lobes as well as left greater than right cerebellum and right thalamus. There is susceptibility and T1 shortening associated with some infarcts reflecting chronic blood products and laminar necrosis. Stable calcified meningioma along the right anterior falx without underlying parenchymal edema. Additional areas of T2 hyperintensity in the supratentorial and pontine white matter likely reflect stable chronic microvascular ischemic changes. Ventricles are stable in size. Vascular: Major vessel flow voids at the skull base are preserved. Skull and upper cervical spine: Normal marrow signal is preserved. Sinuses/Orbits: Paranasal sinuses are aerated. Orbits are unremarkable. Other: Sella is unremarkable.  Mastoid air cells are clear. MRA HEAD Intracranial internal carotid arteries are  patent. Middle and anterior cerebral arteries are patent. Intracranial vertebral arteries, basilar artery, posterior cerebral arteries are patent. There is no significant stenosis or aneurysm. MRA NECK Common, internal, and external carotid arteries are patent. Extracranial vertebral arteries are patent and codominant. There is no measurable stenosis or evidence of dissection. IMPRESSION: Subacute right parietal cortical/subcortical infarct as noted previously. Numerous chronic infarcts. No large vessel occlusion, hemodynamically significant stenosis, or evidence of dissection. 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 Electronically Signed   By: Macy Mis M.D.   On: 06/20/2020 14:27   CT CHEST ABDOMEN PELVIS W CONTRAST  Result Date: 06/16/2020 CLINICAL DATA:  64 year old female with encephalopathy. EXAM: CT CHEST, ABDOMEN, AND PELVIS WITH CONTRAST TECHNIQUE: Multidetector CT imaging of the chest, abdomen and pelvis was performed following the standard protocol during bolus administration of intravenous contrast. CONTRAST:  145mL OMNIPAQUE IOHEXOL 300 MG/ML  SOLN COMPARISON:  Portable chest x-ray 06/07/2020. Abdominal radiographs 03/23/2019. FINDINGS: CT CHEST FINDINGS Cardiovascular: Tortuous thoracic aorta. Mild aortic atherosclerosis. Grossly patent main pulmonary arteries. Mild cardiomegaly. No pericardial effusion. Mediastinum/Nodes: Negative.  No mediastinal lymphadenopathy. Lungs/Pleura: Major airways are patent. Mild atelectasis at the left lung base. Minimal atelectasis in the right costophrenic angle. Similar mild atelectasis or scarring in the medial segment of the right middle lobe. No pleural effusion. Musculoskeletal: Mild chronic appearing superior T1 endplate deformity, possibly due to Schmorl's node. Previous left humerus ORIF with streak artifact. Small right C7 cervical rib (normal variant). Chronic left 2nd through 6th rib fractures, the 2nd rib fracture is ununited. No acute  osseous abnormality identified. CT ABDOMEN PELVIS FINDINGS Hepatobiliary: Negative liver and gallbladder. No bile duct enlargement. Pancreas: Negative. Spleen: Diminutive, negative. Adrenals/Urinary Tract: Normal adrenal glands. Multiple bilateral areas of chronic renal cortical scarring/atrophy. But symmetric renal enhancement and contrast excretion. No renal mass or nephrolithiasis identified. No hydronephrosis. Decompressed ureters. Distended urinary bladder, estimated bladder volume 970 mL. No perivesical stranding. Stomach/Bowel: Large stool ball in the rectum (sagittal image 67). No perirectal inflammation. Decompressed sigmoid colon is redundant. Decompressed descending colon and redundant splenic flexure. Mild retained stool in the right and transverse colon. Normal appendix (series 3, image 85). No large bowel inflammation. Negative terminal ileum. No dilated small bowel. Decompressed stomach. Negative duodenum. No free air, free fluid, mesenteric stranding. Vascular/Lymphatic: Tortuous abdominal aorta with mild atherosclerosis. Major arterial structures remain patent. Grossly patent portal venous system. No lymphadenopathy. Reproductive: Negative. Other: No pelvic free fluid. Small ventral abdominal wall subcutaneous injection sites suspected on series 3, image 90. Musculoskeletal: Grade 1 anterolisthesis of L5 on S1. No pars fracture. Degenerative lumbar disc bulging elsewhere. No acute osseous abnormality identified. IMPRESSION: 1. Large stool ball in the  rectum suggests fecal impaction. No associated bowel obstruction or inflammation. 2. Distended urinary bladder (estimated volume 970 mL) compatible with urinary retention. Bilateral chronic renal cortical scarring but no hydronephrosis. 3. No other acute or inflammatory process identified in the chest, abdomen, or pelvis. 4. Tortuous aorta. Chronic left rib fractures. Degenerative lumbosacral spondylolisthesis. Electronically Signed   By: Genevie Ann M.D.    On: 06/16/2020 15:20   DG Chest Port 1 View  Result Date: 06/07/2020 CLINICAL DATA:  Altered mental status, chest pain EXAM: PORTABLE CHEST 1 VIEW COMPARISON:  03/29/2020 chest radiograph. FINDINGS: Stable cardiomediastinal silhouette with mild cardiomegaly. No pneumothorax. No pleural effusion. Lungs appear clear, with no acute consolidative airspace disease and no pulmonary edema. IMPRESSION: Stable mild cardiomegaly without pulmonary edema. No active pulmonary disease. Electronically Signed   By: Ilona Sorrel M.D.   On: 06/07/2020 09:49   EEG adult  Result Date: 06/08/2020 Lora Havens, MD     06/08/2020 12:56 PM Patient Name: Sonya George MRN: [id number] Epilepsy Attending: Lora Havens Referring Physician/Provider: Claiborne Billings, PA Date: 06/08/2020 Duration: 25.15 mins Patient history: 64yo F with ams. EEG to evaluate for seizure. Level of alertness: Awake AEDs during EEG study: Ativan Technical aspects: This EEG study was done with scalp electrodes positioned according to the 10-20 International system of electrode placement. Electrical activity was acquired at a sampling rate of 500Hz  and reviewed with a high frequency filter of 70Hz  and a low frequency filter of 1Hz . EEG data were recorded continuously and digitally stored. Description: The posterior dominant rhythm consists of 8 Hz activity of moderate voltage (25-35 uV) seen predominantly in posterior head regions, symmetric and reactive to eye opening and eye closing. EEG showed continuous generalized 3 to 6 Hz theta-delta slowing admixed with 15-18Hz  beta activity in frontocentral regions.   Hyperventilation and photic stimulation were not performed.   ABNORMALITY -Continuous slow, generalized IMPRESSION: This study is suggestive of mild diffuse encephalopathy, nonspecific etiology. No seizures or epileptiform discharges were seen throughout the recording. Priyanka Barbra Sarks   Overnight EEG with video  Result Date:  06/17/2020 Lora Havens, MD     06/18/2020  8:27 AM Patient Name: Sonya George MRN: [id number] Epilepsy Attending: Lora Havens Referring Physician/Provider:  Dr. Rosalin Hawking Duration:  06/16/2020 1113 to 06/17/2020 1113  Patient history: 64yo F with rapidly progressive dementia as well as subacute right parietal infarct. EEG to evaluate for seizure.  Level of alertness: Awake, asleep  AEDs during EEG study: None  Technical aspects: This EEG study was done with scalp electrodes positioned according to the 10-20 International system of electrode placement. Electrical activity was acquired at a sampling rate of 500Hz  and reviewed with a high frequency filter of 70Hz  and a low frequency filter of 1Hz . EEG data were recorded continuously and digitally stored.  Description: The posterior dominant rhythm consists of 8 Hz activity of moderate voltage (25-35 uV) seen predominantly in posterior head regions, symmetric and reactive to eye opening and eye closing.  Sleep was characterized by vertex waves, maximal frontocentral region.  EEG showed continuous generalized 3 to 6 Hz theta-delta slowing.  Sharp transients were also noted in right temporal region. Hyperventilation and photic stimulation were not performed.   Of note, parts of the study were difficult to review due to significant electrode artifact.  ABNORMALITY -Continuous slow, generalized  IMPRESSION: This technically difficult study is suggestive of mild diffuse encephalopathy, nonspecific etiology. No seizures or definite epileptiform discharges were seen throughout the recording.  Priyanka Barbra Sarks    Micro Results     Recent Results (from the past 240 hour(s))  CSF culture     Status: None   Collection Time: 06/17/20  7:08 PM   Specimen: CSF  Result Value Ref Range Status   Specimen Description CSF  Final   Special Requests NONE  Final   Gram Stain NO WBC SEEN NO ORGANISMS SEEN   Final   Culture   Final    NO GROWTH 3  DAYS Performed at [facility], [address], [place], [place] [address]    Report Status 06/21/2020 FINAL  Final  SARS Coronavirus 2 by RT PCR (hospital order, performed in [facility] hospital lab) Nasopharyngeal Nasopharyngeal Swab     Status: None   Collection Time: 06/24/20  1:39 PM   Specimen: Nasopharyngeal Swab  Result Value Ref Range Status   SARS Coronavirus 2 NEGATIVE NEGATIVE Final    Comment: (NOTE) SARS-CoV-2 target nucleic acids are NOT DETECTED.  The SARS-CoV-2 RNA is generally detectable in upper and lower respiratory specimens during the acute phase of infection. The lowest concentration of SARS-CoV-2 viral copies this assay can detect is 250 copies / mL. A negative result does not preclude SARS-CoV-2 infection and should not be used as the sole basis for treatment or other patient management decisions.  A negative result may occur with improper specimen collection / handling, submission of specimen other than nasopharyngeal swab, presence of viral mutation(s) within the areas targeted by this assay, and inadequate number of viral copies (<250 copies / mL). A negative result must be combined with clinical observations, patient history, and epidemiological information.  Fact Sheet for Patients:   StrictlyIdeas.no  Fact Sheet for Healthcare Providers: BankingDealers.co.za  This test is not yet approved or  cleared by the [place] FDA and has been authorized for detection and/or diagnosis of SARS-CoV-2 by FDA under an Emergency Use Authorization (EUA).  This EUA will remain in effect (meaning this test can be used) for the duration of the COVID-19 declaration under Section 564(b)(1) of the Act, 21 U.S.C. section 360bbb-3(b)(1), unless the authorization is terminated or revoked sooner.  Performed at [facility], [place] [address], [place], [place] [address]     Today   Subjective    Sonya George today in bed, comfortable.   Objective   Blood pressure 127/81, pulse 85, temperature 98 F (36.7 C), temperature source Oral, resp. rate 18, height 5\' 6"  (1.676 m), weight 63.5 kg, SpO2 100 %.   Intake/Output Summary (Last 24 hours) at 06/25/2020 0706 Last data filed at 06/25/2020 0355 Gross per 24 hour  Intake 0 ml  Output 1000 ml  Net -1000 ml    Exam  Awake but confused,  [place].AT,PERRAL Supple Neck,No JVD, No cervical lymphadenopathy appriciated.  Symmetrical Chest wall movement, Good air movement bilaterally, CTAB RRR,No Gallops,Rubs or new Murmurs, No Parasternal Heave +ve B.Sounds, Abd Soft, Non tender, No organomegaly appriciated, No rebound -guarding or rigidity. No Cyanosis, Clubbing or edema, No new Rash or bruise   Data Review   CBC w Diff:  Lab Results  Component Value Date   WBC 10.4 06/23/2020   HGB 12.9 06/23/2020   HCT 38.8 06/23/2020   PLT 284 06/23/2020   LYMPHOPCT 28 06/07/2020   MONOPCT 7 06/07/2020   EOSPCT 0 06/07/2020   BASOPCT 1 06/07/2020    CMP:  Lab Results  Component Value Date   NA 139 06/23/2020  K 4.0 06/23/2020   CL 104 06/23/2020   CO2 26 06/23/2020   BUN 19 06/23/2020   CREATININE 0.61 06/23/2020   PROT 7.2 06/19/2020   ALBUMIN 3.4 (L) 06/19/2020   BILITOT 0.7 06/19/2020   ALKPHOS 63 06/19/2020   AST 35 06/19/2020   ALT 20 06/19/2020  .   Total Time in preparing paper work, data evaluation and todays exam - 58 minutes  Lala Lund M.D on 06/25/2020 at 7:06 AM  [facility]   Office  [phone]

## 2020-06-25 NOTE — TOC Transition Note (Signed)
Transition of Care [facility]) - CM/SW Discharge Note   Patient Details  Name: Sonya George MRN: [id number] Date of Birth: 01-May-1956  [facility] CM/SW Contact:  Pollie Friar, RN Phone Number: 06/25/2020, 3:06 PM   Clinical Narrative:    Pt is discharging home with her daughter. Pt is without SSN so wont qualify for charity DME. CM spoke with team lead and she approved a LOG for the DME since daughter is unable to afford the cost.  Daughter to provide transport home.   Final next level of care: Home/Self Care Barriers to Discharge: No Barriers Identified   Patient Goals and CMS Choice Patient states their goals for this hospitalization and ongoing recovery are:: patient unable to participate in goal setting due to disorientation CMS Medicare.gov Compare Post Acute Care list provided to:: Patient Represenative (must comment) Choice offered to / list presented to : Adult Children  Discharge Placement                       Discharge Plan and Services     Post Acute Care Choice: Durable Medical Equipment          DME Arranged: 3-N-1, Wheelchair manual DME Agency: [employer] Date DME Agency Contacted: 06/25/20   Representative spoke with at DME Agency: LOG with [facility]--approved by Olga Coaster            Social Determinants of Health (SDOH) Interventions     Readmission Risk Interventions No flowsheet data found.

## 2020-06-25 NOTE — Progress Notes (Signed)
Patient discharged home via private vehicle with daughter. All belongings including wheelchair and [facility] sent with patient.

## 2020-07-10 LAB — BETA ISOFORM (CREUTZFELDT-JAKOB DISEASE)

## 2020-07-15 LAB — HSV 1/2 PCR, CSF
HSV-1 DNA: NEGATIVE
HSV-2 DNA: NEGATIVE

## 2020-07-18 NOTE — Progress Notes (Signed)
If we could have this pt in for a visit with me in the next few weeks that would be great.  Hospital follow up slot please!  Thank you,  Rich

## 2020-07-21 ENCOUNTER — Telehealth: Payer: Self-pay | Admitting: Registered Nurse

## 2020-07-21 LAB — MISC LABCORP TEST (SEND OUT): Labcorp test code: 813940

## 2020-07-21 NOTE — Telephone Encounter (Signed)
Tried calling pt to sch appt but VM was full. Will try again later. Please advise.

## 2020-07-21 NOTE — Telephone Encounter (Signed)
-----   Message from Maximiano Coss, NP sent at 07/18/2020 11:45 AM EST ----- If we could have this pt in for a visit with me in the next few weeks that would be great.  Hospital follow up slot please!  Thank you,  Rich

## 2020-07-22 NOTE — Telephone Encounter (Signed)
Tried calling pt again no answer again.

## 2020-07-22 NOTE — Telephone Encounter (Signed)
If we could try her again please and thank you

## 2020-07-30 NOTE — Progress Notes (Signed)
Hello -   If we could have this patient in for an OV soon, that would be great.  Thanks,  Luan Pulling

## 2020-08-05 ENCOUNTER — Encounter: Payer: Self-pay | Admitting: Radiology

## 2021-09-04 IMAGING — MR MR HEAD WO/W CM
11 of 16 series · 19 of 48 positions shown · IV contrast (gadavist)
Comparison: 06/15/2019 and 03/30/2019

CLINICAL DATA: Altered mental status

EXAM:
MRI HEAD WITHOUT AND WITH CONTRAST
MRA HEAD WITHOUT CONTRAST
MRA NECK WITHOUT AND WITH CONTRAST
TECHNIQUE: Multiplanar, multiecho pulse sequences of the brain and surrounding
structures were obtained without and with intravenous contrast.
Angiographic images of the Circle of Willis were obtained using MRA
technique without intravenous contrast. Angiographic images of the
neck were obtained using MRA technique without and with intravenous
contrast. Carotid stenosis measurements (when applicable) are
obtained utilizing NASCET criteria, using the distal internal
carotid diameter as the denominator.
CONTRAST:  6.5mL GADAVIST GADOBUTROL 1 MMOL/ML IV SOLN

[Series 2: DWI · axial · 3.0mm · 0.94mm/px · z∈[-119,+16]mm · 5 of 94 slices shown (1 of 2)]
[im 1/94]
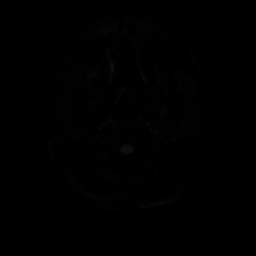
[im 24/94]
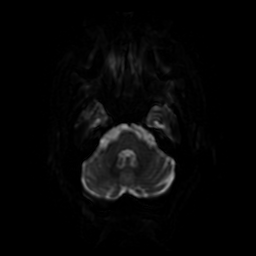
[im 47/94]
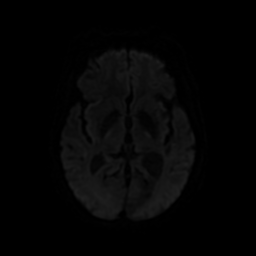
[im 70/94]
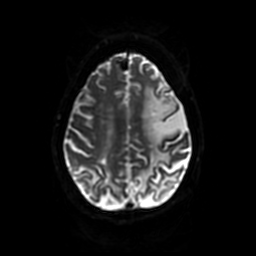
[im 94/94]
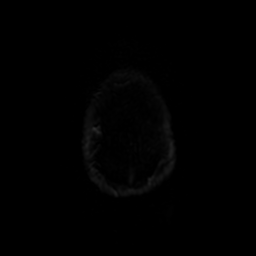

[Series 3: ax (id) · axial · 1.0mm · 0.43mm/px · 1 of 184 slices shown]
[im 1/184]
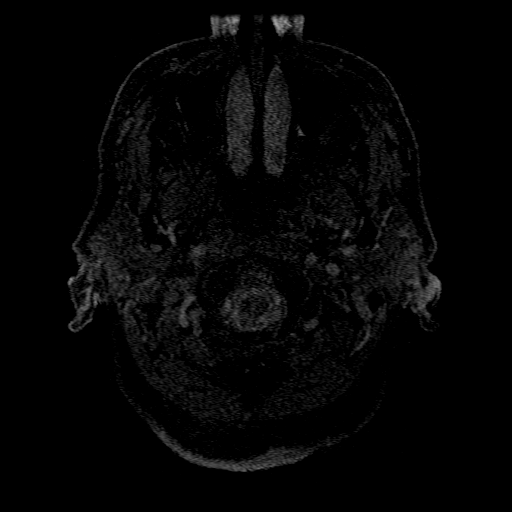

[Series 4: DWI · coronal · 4.0mm · 0.94mm/px · 3 of 70 slices shown (2 of 2)]
[im 1/70]
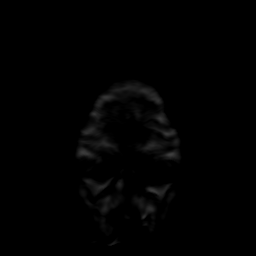
[im 35/70]
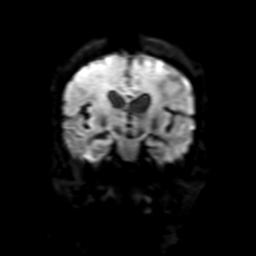
[im 70/70]
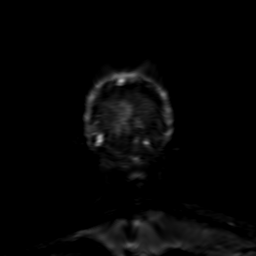

[Series 5: FLAIR · sagittal · 5.0mm · 0.23mm/px · 1 of 23 slices shown (1 of 2)]
[im 1/23]
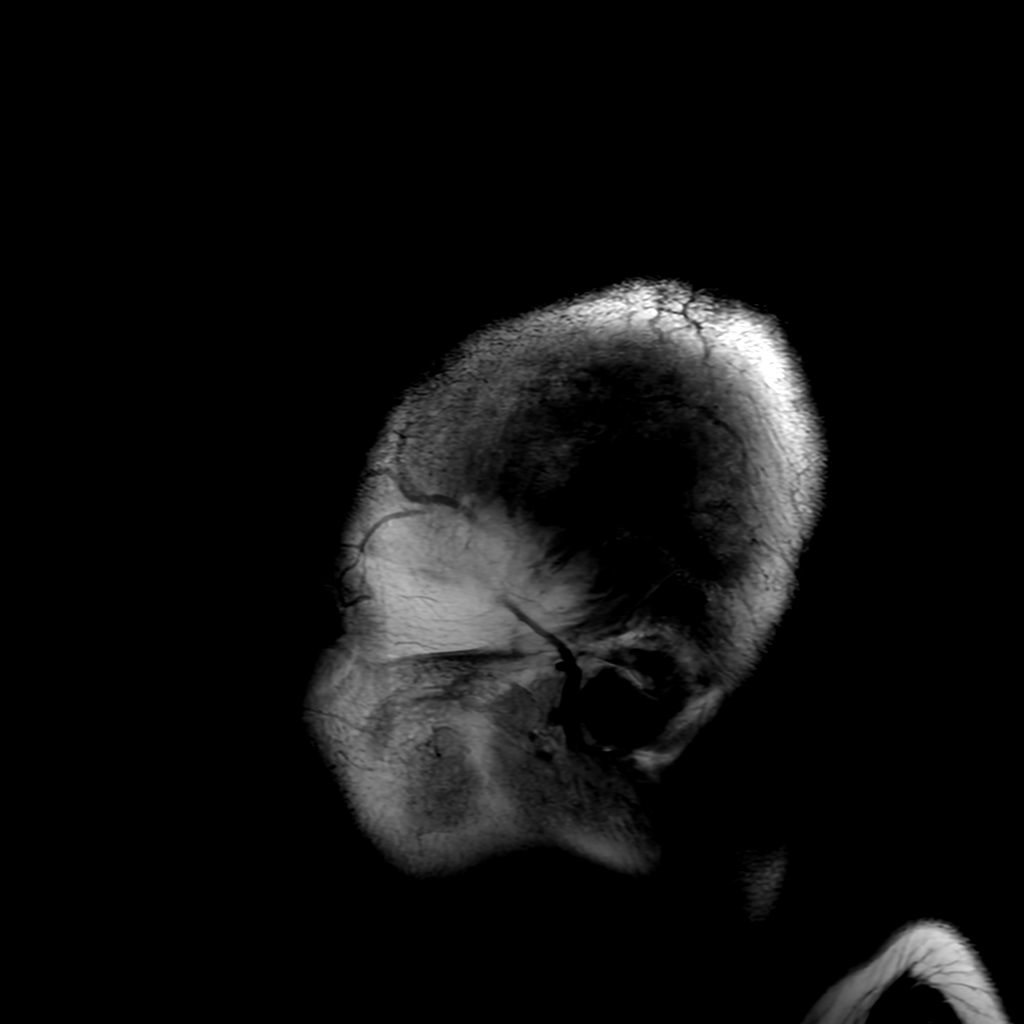

[Series 6: T2 · axial · 5.0mm · 0.23mm/px · 1 of 24 slices shown (1 of 2)]
[im 1/24]
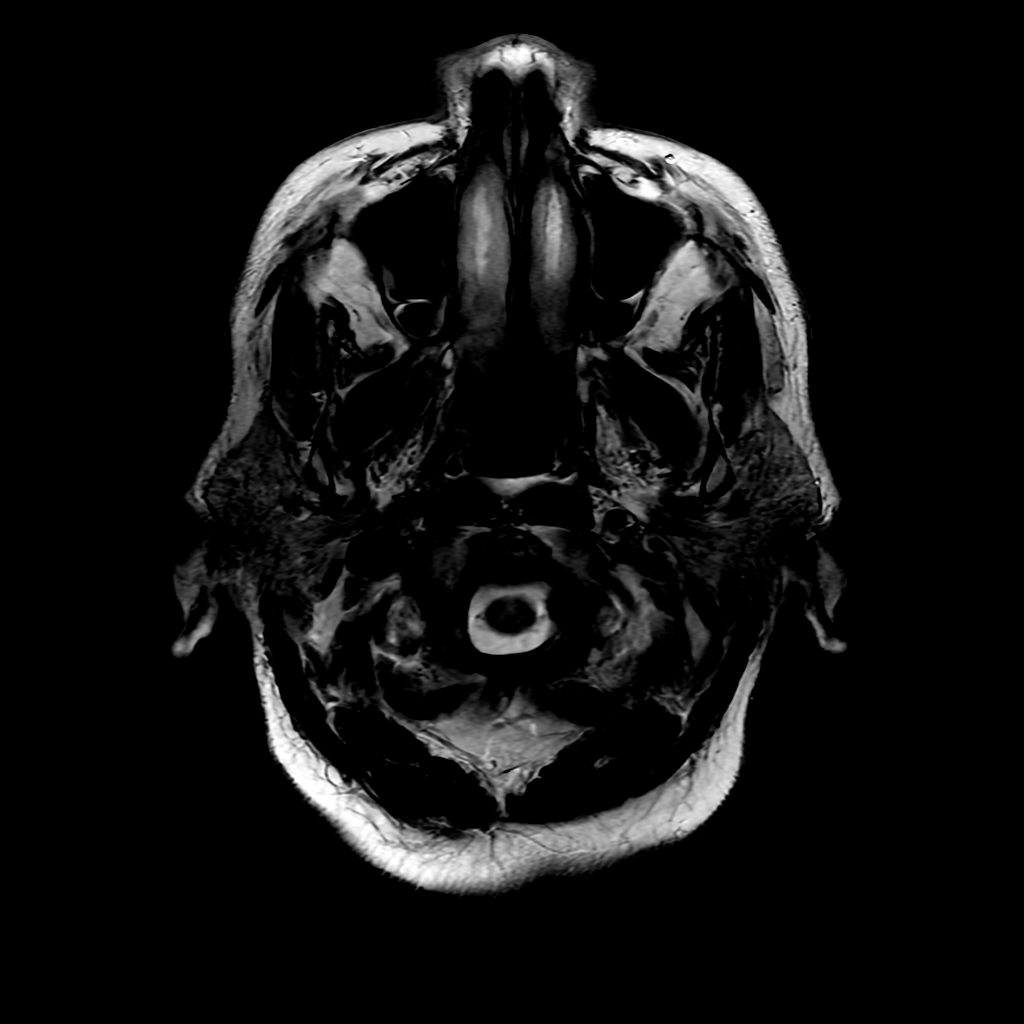

[Series 7: FLAIR · axial · 3.0mm · 0.45mm/px · 1 of 24 slices shown (2 of 2)]
[im 1/24]
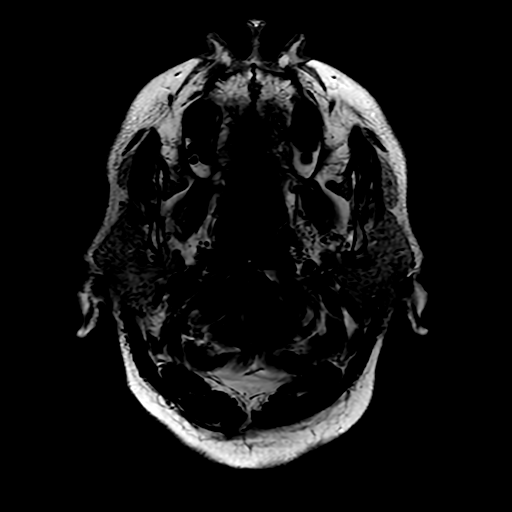

[Series 10: T2 · coronal · 5.0mm · 0.20mm/px · 1 of 29 slices shown (2 of 2)]
[im 1/29]
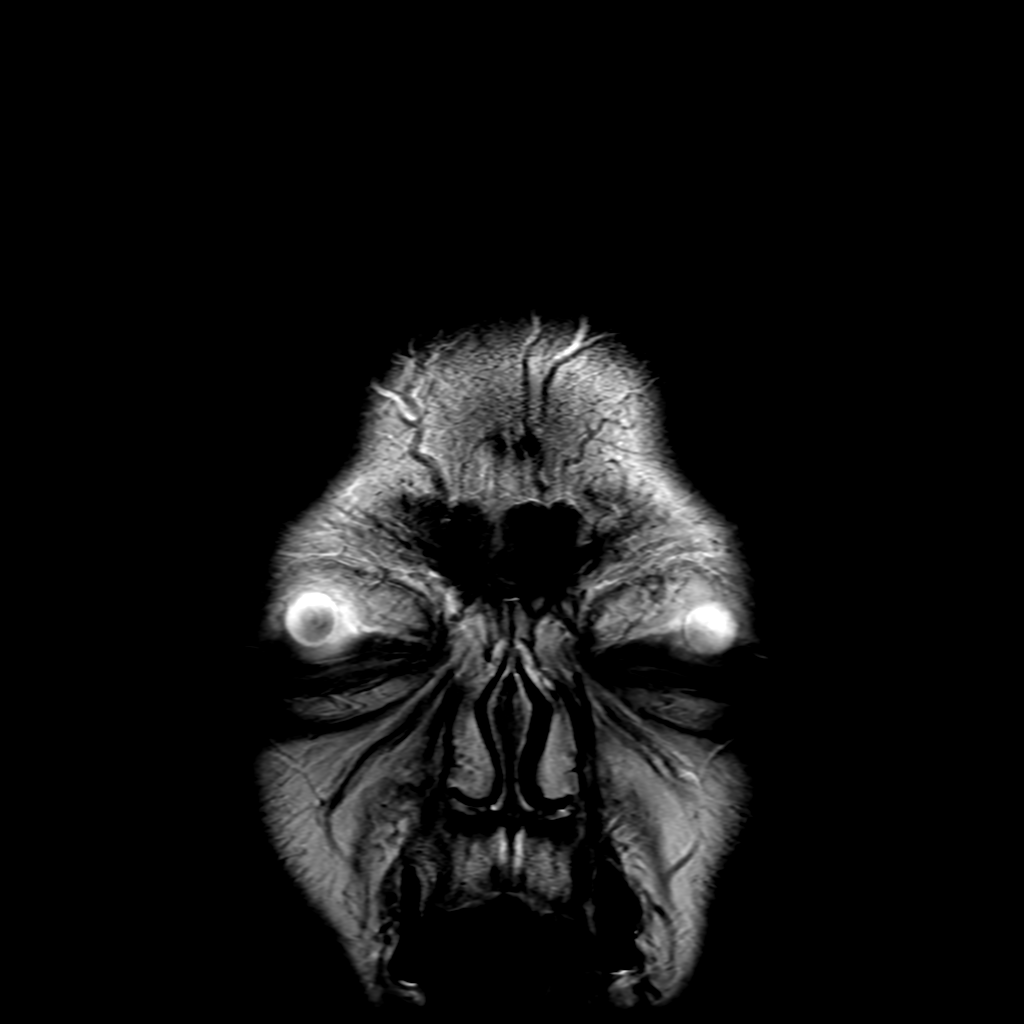

[Series 14: T1 · axial · 3.0mm · 0.94mm/px · z∈[-120,+31]mm · 2 of 52 slices shown (1 of 2)]
[im 1/52]
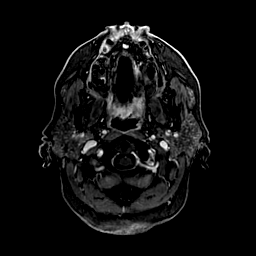
[im 52/52]
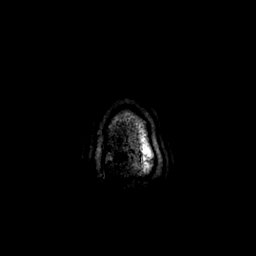

[Series 15: T1 · coronal · 5.0mm · 0.39mm/px · 1 of 29 slices shown (2 of 2)]
[im 1/29]
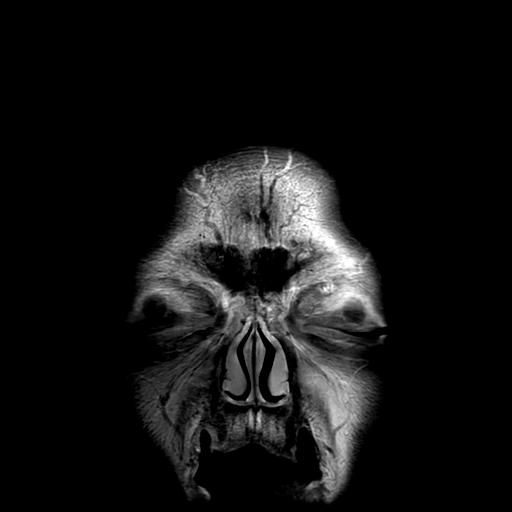

[Series 250: ADC · axial · 3.0mm · 0.94mm/px · z∈[-119,+16]mm · 2 of 47 slices shown (1 of 2)]
[im 1/47]
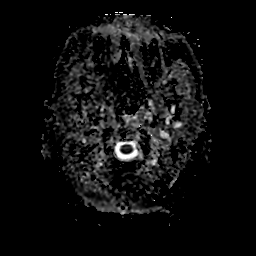
[im 47/47]
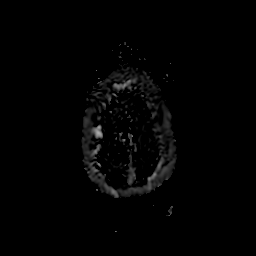

[Series 450: ADC · coronal · 4.0mm · 0.94mm/px · 1 of 35 slices shown (2 of 2)]
[im 1/35]
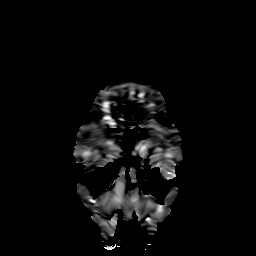

[19 of 48 positions shown; findings below may reference images not displayed]

FINDINGS: MRI HEAD

Brain: As seen previously, there is a subacute right parietal
cortical/subcortical infarct. There is some associated enhancement.

There is unchanged residual diffusion hyperintensity in the area of
chronic left parietal infarct. Multiple additional chronic infarcts
are again noted including involvement of the bilateral frontal,
parietal, and occipital lobes as well as left greater than right
cerebellum and right thalamus. There is susceptibility and T1
shortening associated with some infarcts reflecting chronic blood
products and laminar necrosis.

Stable calcified meningioma along the right anterior falx without
underlying parenchymal edema. Additional areas of T2 hyperintensity
in the supratentorial and pontine white matter likely reflect stable
chronic microvascular ischemic changes. Ventricles are stable in
size.

Vascular: Major vessel flow voids at the skull base are preserved.

Skull and upper cervical spine: Normal marrow signal is preserved.

Sinuses/Orbits: Paranasal sinuses are aerated. Orbits are
unremarkable.

Other: Sella is unremarkable.  Mastoid air cells are clear.

MRA HEAD

Intracranial internal carotid arteries are patent. Middle and
anterior cerebral arteries are patent. Intracranial vertebral
arteries, basilar artery, posterior cerebral arteries are patent.
There is no significant stenosis or aneurysm.

MRA NECK

Common, internal, and external carotid arteries are patent.
Extracranial vertebral arteries are patent and codominant. There is
no measurable stenosis or evidence of dissection.
IMPRESSION: Subacute right parietal cortical/subcortical infarct as noted
previously. Numerous chronic infarcts.

No large vessel occlusion, hemodynamically significant stenosis, or
evidence of dissection.

Stable falcine meningioma without parenchymal edema.

## 2021-12-07 NOTE — Telephone Encounter (Signed)
NA
